# Patient Record
Sex: Female | Born: 1935 | Race: White | Hispanic: No | Smoking: Former smoker
Health system: Southern US, Community
[De-identification: ages and names within clinical notes are randomized; demographics above are authoritative.]

## PROBLEM LIST (undated history)

## (undated) DIAGNOSIS — J449 Chronic obstructive pulmonary disease, unspecified: Secondary | ICD-10-CM

## (undated) DIAGNOSIS — K219 Gastro-esophageal reflux disease without esophagitis: Secondary | ICD-10-CM

## (undated) DIAGNOSIS — E039 Hypothyroidism, unspecified: Secondary | ICD-10-CM

## (undated) DIAGNOSIS — N289 Disorder of kidney and ureter, unspecified: Secondary | ICD-10-CM

## (undated) DIAGNOSIS — Z87440 Personal history of urinary (tract) infections: Secondary | ICD-10-CM

## (undated) DIAGNOSIS — M1990 Unspecified osteoarthritis, unspecified site: Secondary | ICD-10-CM

## (undated) DIAGNOSIS — I639 Cerebral infarction, unspecified: Secondary | ICD-10-CM

## (undated) DIAGNOSIS — E119 Type 2 diabetes mellitus without complications: Secondary | ICD-10-CM

## (undated) DIAGNOSIS — E785 Hyperlipidemia, unspecified: Secondary | ICD-10-CM

## (undated) DIAGNOSIS — G255 Other chorea: Secondary | ICD-10-CM

## (undated) DIAGNOSIS — I10 Essential (primary) hypertension: Secondary | ICD-10-CM

## (undated) DIAGNOSIS — R42 Dizziness and giddiness: Secondary | ICD-10-CM

## (undated) DIAGNOSIS — C3490 Malignant neoplasm of unspecified part of unspecified bronchus or lung: Secondary | ICD-10-CM

## (undated) DIAGNOSIS — Z9109 Other allergy status, other than to drugs and biological substances: Secondary | ICD-10-CM

## (undated) DIAGNOSIS — Z8679 Personal history of other diseases of the circulatory system: Secondary | ICD-10-CM

## (undated) DIAGNOSIS — H6690 Otitis media, unspecified, unspecified ear: Secondary | ICD-10-CM

## (undated) DIAGNOSIS — G459 Transient cerebral ischemic attack, unspecified: Secondary | ICD-10-CM

## (undated) HISTORY — DX: Other chorea: G25.5

## (undated) HISTORY — PX: CARDIAC CATHETERIZATION: SHX172

## (undated) HISTORY — DX: Transient cerebral ischemic attack, unspecified: G45.9

## (undated) HISTORY — DX: Unspecified osteoarthritis, unspecified site: M19.90

## (undated) HISTORY — DX: Personal history of other diseases of the circulatory system: Z86.79

## (undated) HISTORY — PX: CATARACT EXTRACTION: SUR2

## (undated) HISTORY — DX: Type 2 diabetes mellitus without complications: E11.9

## (undated) HISTORY — DX: Malignant neoplasm of unspecified part of unspecified bronchus or lung: C34.90

## (undated) HISTORY — DX: Cerebral infarction, unspecified: I63.9

## (undated) HISTORY — DX: Other allergy status, other than to drugs and biological substances: Z91.09

## (undated) HISTORY — PX: LUNG LOBECTOMY: SHX167

## (undated) HISTORY — DX: Disorder of kidney and ureter, unspecified: N28.9

## (undated) HISTORY — PX: THYROID SURGERY: SHX805

## (undated) HISTORY — DX: Hypothyroidism, unspecified: E03.9

## (undated) HISTORY — DX: Otitis media, unspecified, unspecified ear: H66.90

## (undated) HISTORY — DX: Gastro-esophageal reflux disease without esophagitis: K21.9

## (undated) HISTORY — DX: Dizziness and giddiness: R42

## (undated) HISTORY — DX: Personal history of urinary (tract) infections: Z87.440

## (undated) HISTORY — DX: Chronic obstructive pulmonary disease, unspecified: J44.9

## (undated) HISTORY — DX: Essential (primary) hypertension: I10

## (undated) HISTORY — DX: Hyperlipidemia, unspecified: E78.5

---

## 2004-10-15 ENCOUNTER — Ambulatory Visit: Payer: Self-pay | Admitting: Urology

## 2004-10-24 ENCOUNTER — Ambulatory Visit: Payer: Self-pay | Admitting: Urology

## 2004-11-08 ENCOUNTER — Ambulatory Visit: Payer: Self-pay | Admitting: Urology

## 2007-01-28 ENCOUNTER — Ambulatory Visit: Payer: Self-pay | Admitting: Internal Medicine

## 2007-02-04 ENCOUNTER — Ambulatory Visit: Payer: Self-pay | Admitting: Internal Medicine

## 2007-06-25 ENCOUNTER — Ambulatory Visit: Payer: Self-pay | Admitting: Urology

## 2007-08-14 ENCOUNTER — Ambulatory Visit: Payer: Self-pay | Admitting: Internal Medicine

## 2007-09-16 ENCOUNTER — Other Ambulatory Visit: Payer: Self-pay

## 2007-09-16 ENCOUNTER — Ambulatory Visit: Payer: Self-pay | Admitting: Vascular Surgery

## 2007-09-23 ENCOUNTER — Inpatient Hospital Stay: Payer: Self-pay | Admitting: Vascular Surgery

## 2008-02-09 ENCOUNTER — Ambulatory Visit: Payer: Self-pay | Admitting: Family Medicine

## 2008-02-17 ENCOUNTER — Ambulatory Visit: Payer: Self-pay | Admitting: Internal Medicine

## 2008-03-10 ENCOUNTER — Ambulatory Visit: Payer: Self-pay | Admitting: Internal Medicine

## 2008-09-06 ENCOUNTER — Ambulatory Visit: Payer: Self-pay | Admitting: Internal Medicine

## 2008-09-14 ENCOUNTER — Ambulatory Visit: Payer: Self-pay | Admitting: Internal Medicine

## 2008-09-19 ENCOUNTER — Ambulatory Visit: Payer: Self-pay | Admitting: Internal Medicine

## 2008-11-14 ENCOUNTER — Ambulatory Visit: Payer: Self-pay | Admitting: Internal Medicine

## 2009-02-17 ENCOUNTER — Ambulatory Visit: Payer: Self-pay | Admitting: Internal Medicine

## 2009-04-04 ENCOUNTER — Ambulatory Visit: Payer: Self-pay | Admitting: Internal Medicine

## 2009-05-23 ENCOUNTER — Ambulatory Visit: Payer: Self-pay | Admitting: Internal Medicine

## 2009-06-19 ENCOUNTER — Encounter: Payer: Self-pay | Admitting: Cardiothoracic Surgery

## 2009-07-12 ENCOUNTER — Encounter: Payer: Self-pay | Admitting: Cardiothoracic Surgery

## 2009-08-11 ENCOUNTER — Encounter: Payer: Self-pay | Admitting: Cardiothoracic Surgery

## 2009-09-11 ENCOUNTER — Encounter: Payer: Self-pay | Admitting: Cardiothoracic Surgery

## 2009-10-11 ENCOUNTER — Encounter: Payer: Self-pay | Admitting: Cardiothoracic Surgery

## 2009-10-18 ENCOUNTER — Ambulatory Visit: Payer: Self-pay | Admitting: Internal Medicine

## 2009-11-11 ENCOUNTER — Ambulatory Visit: Payer: Self-pay | Admitting: Internal Medicine

## 2009-12-12 ENCOUNTER — Ambulatory Visit: Payer: Self-pay | Admitting: Internal Medicine

## 2010-02-08 ENCOUNTER — Ambulatory Visit: Payer: Self-pay | Admitting: Internal Medicine

## 2010-02-09 ENCOUNTER — Ambulatory Visit: Payer: Self-pay | Admitting: Internal Medicine

## 2010-03-07 ENCOUNTER — Ambulatory Visit: Payer: Self-pay | Admitting: Urology

## 2010-04-19 ENCOUNTER — Ambulatory Visit: Payer: Self-pay | Admitting: Internal Medicine

## 2010-11-06 ENCOUNTER — Ambulatory Visit: Payer: Self-pay | Admitting: Internal Medicine

## 2011-12-18 ENCOUNTER — Ambulatory Visit: Payer: Self-pay | Admitting: Internal Medicine

## 2011-12-18 DIAGNOSIS — R928 Other abnormal and inconclusive findings on diagnostic imaging of breast: Secondary | ICD-10-CM | POA: Diagnosis not present

## 2011-12-18 DIAGNOSIS — Z1231 Encounter for screening mammogram for malignant neoplasm of breast: Secondary | ICD-10-CM | POA: Diagnosis not present

## 2012-01-01 DIAGNOSIS — N3020 Other chronic cystitis without hematuria: Secondary | ICD-10-CM | POA: Diagnosis not present

## 2012-01-01 DIAGNOSIS — J305 Allergic rhinitis due to food: Secondary | ICD-10-CM | POA: Diagnosis not present

## 2012-01-01 DIAGNOSIS — R339 Retention of urine, unspecified: Secondary | ICD-10-CM | POA: Diagnosis not present

## 2012-01-01 DIAGNOSIS — R3129 Other microscopic hematuria: Secondary | ICD-10-CM | POA: Diagnosis not present

## 2012-01-01 DIAGNOSIS — R221 Localized swelling, mass and lump, neck: Secondary | ICD-10-CM | POA: Diagnosis not present

## 2012-01-01 DIAGNOSIS — N3946 Mixed incontinence: Secondary | ICD-10-CM | POA: Diagnosis not present

## 2012-01-01 DIAGNOSIS — R220 Localized swelling, mass and lump, head: Secondary | ICD-10-CM | POA: Diagnosis not present

## 2012-01-15 ENCOUNTER — Other Ambulatory Visit: Payer: Self-pay | Admitting: Otolaryngology

## 2012-01-15 DIAGNOSIS — Z1389 Encounter for screening for other disorder: Secondary | ICD-10-CM | POA: Diagnosis not present

## 2012-01-15 DIAGNOSIS — K1120 Sialoadenitis, unspecified: Secondary | ICD-10-CM | POA: Diagnosis not present

## 2012-01-15 DIAGNOSIS — R220 Localized swelling, mass and lump, head: Secondary | ICD-10-CM | POA: Diagnosis not present

## 2012-01-15 DIAGNOSIS — R221 Localized swelling, mass and lump, neck: Secondary | ICD-10-CM | POA: Diagnosis not present

## 2012-01-15 LAB — CREATININE, SERUM
Creatinine: 1.27 mg/dL (ref 0.60–1.30)
EGFR (African American): 52 — ABNORMAL LOW

## 2012-01-22 ENCOUNTER — Ambulatory Visit: Payer: Self-pay | Admitting: Otolaryngology

## 2012-01-22 DIAGNOSIS — R220 Localized swelling, mass and lump, head: Secondary | ICD-10-CM | POA: Diagnosis not present

## 2012-01-30 DIAGNOSIS — K219 Gastro-esophageal reflux disease without esophagitis: Secondary | ICD-10-CM | POA: Diagnosis not present

## 2012-01-30 DIAGNOSIS — I6529 Occlusion and stenosis of unspecified carotid artery: Secondary | ICD-10-CM | POA: Diagnosis not present

## 2012-01-30 DIAGNOSIS — F411 Generalized anxiety disorder: Secondary | ICD-10-CM | POA: Diagnosis not present

## 2012-01-30 DIAGNOSIS — I10 Essential (primary) hypertension: Secondary | ICD-10-CM | POA: Diagnosis not present

## 2012-01-30 DIAGNOSIS — E782 Mixed hyperlipidemia: Secondary | ICD-10-CM | POA: Diagnosis not present

## 2012-01-30 DIAGNOSIS — C3490 Malignant neoplasm of unspecified part of unspecified bronchus or lung: Secondary | ICD-10-CM | POA: Diagnosis not present

## 2012-01-30 DIAGNOSIS — E109 Type 1 diabetes mellitus without complications: Secondary | ICD-10-CM | POA: Diagnosis not present

## 2012-02-11 DIAGNOSIS — R918 Other nonspecific abnormal finding of lung field: Secondary | ICD-10-CM | POA: Diagnosis not present

## 2012-02-11 DIAGNOSIS — C3410 Malignant neoplasm of upper lobe, unspecified bronchus or lung: Secondary | ICD-10-CM | POA: Diagnosis not present

## 2012-02-11 DIAGNOSIS — E041 Nontoxic single thyroid nodule: Secondary | ICD-10-CM | POA: Diagnosis not present

## 2012-02-11 DIAGNOSIS — C3490 Malignant neoplasm of unspecified part of unspecified bronchus or lung: Secondary | ICD-10-CM | POA: Diagnosis not present

## 2012-02-18 DIAGNOSIS — K1120 Sialoadenitis, unspecified: Secondary | ICD-10-CM | POA: Diagnosis not present

## 2012-02-18 DIAGNOSIS — R42 Dizziness and giddiness: Secondary | ICD-10-CM | POA: Diagnosis not present

## 2012-02-18 DIAGNOSIS — R220 Localized swelling, mass and lump, head: Secondary | ICD-10-CM | POA: Diagnosis not present

## 2012-02-18 DIAGNOSIS — R221 Localized swelling, mass and lump, neck: Secondary | ICD-10-CM | POA: Diagnosis not present

## 2012-02-20 DIAGNOSIS — R42 Dizziness and giddiness: Secondary | ICD-10-CM | POA: Diagnosis not present

## 2012-02-20 DIAGNOSIS — J301 Allergic rhinitis due to pollen: Secondary | ICD-10-CM | POA: Diagnosis not present

## 2012-02-20 DIAGNOSIS — H8110 Benign paroxysmal vertigo, unspecified ear: Secondary | ICD-10-CM | POA: Diagnosis not present

## 2012-02-26 DIAGNOSIS — H8110 Benign paroxysmal vertigo, unspecified ear: Secondary | ICD-10-CM | POA: Diagnosis not present

## 2012-04-15 DIAGNOSIS — Z85828 Personal history of other malignant neoplasm of skin: Secondary | ICD-10-CM | POA: Diagnosis not present

## 2012-04-15 DIAGNOSIS — L570 Actinic keratosis: Secondary | ICD-10-CM | POA: Diagnosis not present

## 2012-05-07 DIAGNOSIS — C3490 Malignant neoplasm of unspecified part of unspecified bronchus or lung: Secondary | ICD-10-CM | POA: Diagnosis not present

## 2012-05-07 DIAGNOSIS — R0602 Shortness of breath: Secondary | ICD-10-CM | POA: Diagnosis not present

## 2012-05-21 DIAGNOSIS — H8110 Benign paroxysmal vertigo, unspecified ear: Secondary | ICD-10-CM | POA: Diagnosis not present

## 2012-05-21 DIAGNOSIS — L0201 Cutaneous abscess of face: Secondary | ICD-10-CM | POA: Diagnosis not present

## 2012-05-21 DIAGNOSIS — L03211 Cellulitis of face: Secondary | ICD-10-CM | POA: Diagnosis not present

## 2012-05-27 DIAGNOSIS — R0602 Shortness of breath: Secondary | ICD-10-CM | POA: Diagnosis not present

## 2012-05-28 DIAGNOSIS — I6529 Occlusion and stenosis of unspecified carotid artery: Secondary | ICD-10-CM | POA: Diagnosis not present

## 2012-05-28 DIAGNOSIS — E782 Mixed hyperlipidemia: Secondary | ICD-10-CM | POA: Diagnosis not present

## 2012-05-28 DIAGNOSIS — K219 Gastro-esophageal reflux disease without esophagitis: Secondary | ICD-10-CM | POA: Diagnosis not present

## 2012-05-28 DIAGNOSIS — J398 Other specified diseases of upper respiratory tract: Secondary | ICD-10-CM | POA: Diagnosis not present

## 2012-05-28 DIAGNOSIS — C3490 Malignant neoplasm of unspecified part of unspecified bronchus or lung: Secondary | ICD-10-CM | POA: Diagnosis not present

## 2012-05-28 DIAGNOSIS — E109 Type 1 diabetes mellitus without complications: Secondary | ICD-10-CM | POA: Diagnosis not present

## 2012-05-28 DIAGNOSIS — F411 Generalized anxiety disorder: Secondary | ICD-10-CM | POA: Diagnosis not present

## 2012-05-28 DIAGNOSIS — I10 Essential (primary) hypertension: Secondary | ICD-10-CM | POA: Diagnosis not present

## 2012-05-29 DIAGNOSIS — E119 Type 2 diabetes mellitus without complications: Secondary | ICD-10-CM | POA: Diagnosis not present

## 2012-05-29 DIAGNOSIS — I10 Essential (primary) hypertension: Secondary | ICD-10-CM | POA: Diagnosis not present

## 2012-06-03 DIAGNOSIS — J438 Other emphysema: Secondary | ICD-10-CM | POA: Diagnosis not present

## 2012-06-16 DIAGNOSIS — R0602 Shortness of breath: Secondary | ICD-10-CM | POA: Diagnosis not present

## 2012-06-16 DIAGNOSIS — C3490 Malignant neoplasm of unspecified part of unspecified bronchus or lung: Secondary | ICD-10-CM | POA: Diagnosis not present

## 2012-07-01 DIAGNOSIS — N182 Chronic kidney disease, stage 2 (mild): Secondary | ICD-10-CM | POA: Diagnosis not present

## 2012-07-01 DIAGNOSIS — N3946 Mixed incontinence: Secondary | ICD-10-CM | POA: Diagnosis not present

## 2012-07-01 DIAGNOSIS — R339 Retention of urine, unspecified: Secondary | ICD-10-CM | POA: Diagnosis not present

## 2012-07-01 DIAGNOSIS — N3020 Other chronic cystitis without hematuria: Secondary | ICD-10-CM | POA: Diagnosis not present

## 2012-07-09 DIAGNOSIS — R221 Localized swelling, mass and lump, neck: Secondary | ICD-10-CM | POA: Diagnosis not present

## 2012-07-09 DIAGNOSIS — C3490 Malignant neoplasm of unspecified part of unspecified bronchus or lung: Secondary | ICD-10-CM | POA: Diagnosis not present

## 2012-07-09 DIAGNOSIS — E782 Mixed hyperlipidemia: Secondary | ICD-10-CM | POA: Diagnosis not present

## 2012-07-09 DIAGNOSIS — I10 Essential (primary) hypertension: Secondary | ICD-10-CM | POA: Diagnosis not present

## 2012-07-09 DIAGNOSIS — IMO0001 Reserved for inherently not codable concepts without codable children: Secondary | ICD-10-CM | POA: Diagnosis not present

## 2012-07-09 DIAGNOSIS — F329 Major depressive disorder, single episode, unspecified: Secondary | ICD-10-CM | POA: Diagnosis not present

## 2012-07-09 DIAGNOSIS — R109 Unspecified abdominal pain: Secondary | ICD-10-CM | POA: Diagnosis not present

## 2012-07-09 DIAGNOSIS — R220 Localized swelling, mass and lump, head: Secondary | ICD-10-CM | POA: Diagnosis not present

## 2012-07-09 DIAGNOSIS — T783XXA Angioneurotic edema, initial encounter: Secondary | ICD-10-CM | POA: Diagnosis not present

## 2012-07-21 DIAGNOSIS — E039 Hypothyroidism, unspecified: Secondary | ICD-10-CM | POA: Diagnosis not present

## 2012-07-28 DIAGNOSIS — R221 Localized swelling, mass and lump, neck: Secondary | ICD-10-CM | POA: Diagnosis not present

## 2012-07-28 DIAGNOSIS — J309 Allergic rhinitis, unspecified: Secondary | ICD-10-CM | POA: Diagnosis not present

## 2012-07-28 DIAGNOSIS — T783XXA Angioneurotic edema, initial encounter: Secondary | ICD-10-CM | POA: Diagnosis not present

## 2012-07-28 DIAGNOSIS — IMO0001 Reserved for inherently not codable concepts without codable children: Secondary | ICD-10-CM | POA: Diagnosis not present

## 2012-07-28 DIAGNOSIS — R220 Localized swelling, mass and lump, head: Secondary | ICD-10-CM | POA: Diagnosis not present

## 2012-07-28 DIAGNOSIS — C3490 Malignant neoplasm of unspecified part of unspecified bronchus or lung: Secondary | ICD-10-CM | POA: Diagnosis not present

## 2012-07-28 DIAGNOSIS — F329 Major depressive disorder, single episode, unspecified: Secondary | ICD-10-CM | POA: Diagnosis not present

## 2012-07-28 DIAGNOSIS — I10 Essential (primary) hypertension: Secondary | ICD-10-CM | POA: Diagnosis not present

## 2012-08-17 DIAGNOSIS — E890 Postprocedural hypothyroidism: Secondary | ICD-10-CM | POA: Diagnosis not present

## 2012-08-17 DIAGNOSIS — E041 Nontoxic single thyroid nodule: Secondary | ICD-10-CM | POA: Diagnosis not present

## 2012-09-14 DIAGNOSIS — E039 Hypothyroidism, unspecified: Secondary | ICD-10-CM | POA: Diagnosis not present

## 2012-09-14 DIAGNOSIS — Z79899 Other long term (current) drug therapy: Secondary | ICD-10-CM | POA: Diagnosis not present

## 2012-09-14 DIAGNOSIS — E782 Mixed hyperlipidemia: Secondary | ICD-10-CM | POA: Diagnosis not present

## 2012-09-14 DIAGNOSIS — E119 Type 2 diabetes mellitus without complications: Secondary | ICD-10-CM | POA: Diagnosis not present

## 2012-09-17 DIAGNOSIS — IMO0001 Reserved for inherently not codable concepts without codable children: Secondary | ICD-10-CM | POA: Diagnosis not present

## 2012-09-17 DIAGNOSIS — I10 Essential (primary) hypertension: Secondary | ICD-10-CM | POA: Diagnosis not present

## 2012-09-17 DIAGNOSIS — Z23 Encounter for immunization: Secondary | ICD-10-CM | POA: Diagnosis not present

## 2012-09-17 DIAGNOSIS — E782 Mixed hyperlipidemia: Secondary | ICD-10-CM | POA: Diagnosis not present

## 2012-09-28 DIAGNOSIS — M109 Gout, unspecified: Secondary | ICD-10-CM | POA: Diagnosis not present

## 2012-09-28 DIAGNOSIS — N183 Chronic kidney disease, stage 3 unspecified: Secondary | ICD-10-CM | POA: Diagnosis not present

## 2012-09-28 DIAGNOSIS — N390 Urinary tract infection, site not specified: Secondary | ICD-10-CM | POA: Diagnosis not present

## 2012-09-28 DIAGNOSIS — I10 Essential (primary) hypertension: Secondary | ICD-10-CM | POA: Diagnosis not present

## 2012-09-28 DIAGNOSIS — N2581 Secondary hyperparathyroidism of renal origin: Secondary | ICD-10-CM | POA: Diagnosis not present

## 2012-10-15 DIAGNOSIS — R109 Unspecified abdominal pain: Secondary | ICD-10-CM | POA: Diagnosis not present

## 2012-10-15 DIAGNOSIS — D485 Neoplasm of uncertain behavior of skin: Secondary | ICD-10-CM | POA: Diagnosis not present

## 2012-10-15 DIAGNOSIS — L259 Unspecified contact dermatitis, unspecified cause: Secondary | ICD-10-CM | POA: Diagnosis not present

## 2012-10-15 DIAGNOSIS — Z85828 Personal history of other malignant neoplasm of skin: Secondary | ICD-10-CM | POA: Diagnosis not present

## 2012-10-26 DIAGNOSIS — M109 Gout, unspecified: Secondary | ICD-10-CM | POA: Diagnosis not present

## 2012-10-26 DIAGNOSIS — N183 Chronic kidney disease, stage 3 unspecified: Secondary | ICD-10-CM | POA: Diagnosis not present

## 2012-10-26 DIAGNOSIS — N2581 Secondary hyperparathyroidism of renal origin: Secondary | ICD-10-CM | POA: Diagnosis not present

## 2012-10-26 DIAGNOSIS — R609 Edema, unspecified: Secondary | ICD-10-CM | POA: Diagnosis not present

## 2012-11-10 DIAGNOSIS — N183 Chronic kidney disease, stage 3 unspecified: Secondary | ICD-10-CM | POA: Diagnosis not present

## 2012-11-10 DIAGNOSIS — M109 Gout, unspecified: Secondary | ICD-10-CM | POA: Diagnosis not present

## 2012-12-22 DIAGNOSIS — R0602 Shortness of breath: Secondary | ICD-10-CM | POA: Diagnosis not present

## 2012-12-22 DIAGNOSIS — C3490 Malignant neoplasm of unspecified part of unspecified bronchus or lung: Secondary | ICD-10-CM | POA: Diagnosis not present

## 2013-01-18 DIAGNOSIS — E119 Type 2 diabetes mellitus without complications: Secondary | ICD-10-CM | POA: Diagnosis not present

## 2013-01-18 DIAGNOSIS — Z006 Encounter for examination for normal comparison and control in clinical research program: Secondary | ICD-10-CM | POA: Diagnosis not present

## 2013-01-18 DIAGNOSIS — E0590 Thyrotoxicosis, unspecified without thyrotoxic crisis or storm: Secondary | ICD-10-CM | POA: Diagnosis not present

## 2013-01-18 DIAGNOSIS — K219 Gastro-esophageal reflux disease without esophagitis: Secondary | ICD-10-CM | POA: Diagnosis not present

## 2013-01-18 DIAGNOSIS — IMO0001 Reserved for inherently not codable concepts without codable children: Secondary | ICD-10-CM | POA: Diagnosis not present

## 2013-01-18 DIAGNOSIS — I6529 Occlusion and stenosis of unspecified carotid artery: Secondary | ICD-10-CM | POA: Diagnosis not present

## 2013-01-18 DIAGNOSIS — I10 Essential (primary) hypertension: Secondary | ICD-10-CM | POA: Diagnosis not present

## 2013-01-18 DIAGNOSIS — M159 Polyosteoarthritis, unspecified: Secondary | ICD-10-CM | POA: Diagnosis not present

## 2013-01-18 DIAGNOSIS — N182 Chronic kidney disease, stage 2 (mild): Secondary | ICD-10-CM | POA: Diagnosis not present

## 2013-01-19 ENCOUNTER — Ambulatory Visit: Payer: Self-pay | Admitting: Internal Medicine

## 2013-01-19 DIAGNOSIS — R928 Other abnormal and inconclusive findings on diagnostic imaging of breast: Secondary | ICD-10-CM | POA: Diagnosis not present

## 2013-01-19 DIAGNOSIS — Z1231 Encounter for screening mammogram for malignant neoplasm of breast: Secondary | ICD-10-CM | POA: Diagnosis not present

## 2013-01-25 DIAGNOSIS — E2839 Other primary ovarian failure: Secondary | ICD-10-CM | POA: Diagnosis not present

## 2013-04-14 DIAGNOSIS — E782 Mixed hyperlipidemia: Secondary | ICD-10-CM | POA: Diagnosis not present

## 2013-04-14 DIAGNOSIS — E039 Hypothyroidism, unspecified: Secondary | ICD-10-CM | POA: Diagnosis not present

## 2013-04-14 DIAGNOSIS — Z79899 Other long term (current) drug therapy: Secondary | ICD-10-CM | POA: Diagnosis not present

## 2013-04-14 DIAGNOSIS — E119 Type 2 diabetes mellitus without complications: Secondary | ICD-10-CM | POA: Diagnosis not present

## 2013-04-22 DIAGNOSIS — N182 Chronic kidney disease, stage 2 (mild): Secondary | ICD-10-CM | POA: Diagnosis not present

## 2013-04-22 DIAGNOSIS — I739 Peripheral vascular disease, unspecified: Secondary | ICD-10-CM | POA: Diagnosis not present

## 2013-04-22 DIAGNOSIS — M109 Gout, unspecified: Secondary | ICD-10-CM | POA: Diagnosis not present

## 2013-04-22 DIAGNOSIS — E039 Hypothyroidism, unspecified: Secondary | ICD-10-CM | POA: Diagnosis not present

## 2013-04-22 DIAGNOSIS — C3410 Malignant neoplasm of upper lobe, unspecified bronchus or lung: Secondary | ICD-10-CM | POA: Diagnosis not present

## 2013-04-22 DIAGNOSIS — E782 Mixed hyperlipidemia: Secondary | ICD-10-CM | POA: Diagnosis not present

## 2013-04-22 DIAGNOSIS — IMO0001 Reserved for inherently not codable concepts without codable children: Secondary | ICD-10-CM | POA: Diagnosis not present

## 2013-04-29 DIAGNOSIS — N182 Chronic kidney disease, stage 2 (mild): Secondary | ICD-10-CM | POA: Diagnosis not present

## 2013-06-15 DIAGNOSIS — L659 Nonscarring hair loss, unspecified: Secondary | ICD-10-CM | POA: Diagnosis not present

## 2013-06-15 DIAGNOSIS — D485 Neoplasm of uncertain behavior of skin: Secondary | ICD-10-CM | POA: Diagnosis not present

## 2013-06-15 DIAGNOSIS — Z85828 Personal history of other malignant neoplasm of skin: Secondary | ICD-10-CM | POA: Diagnosis not present

## 2013-06-30 DIAGNOSIS — C3490 Malignant neoplasm of unspecified part of unspecified bronchus or lung: Secondary | ICD-10-CM | POA: Diagnosis not present

## 2013-06-30 DIAGNOSIS — R220 Localized swelling, mass and lump, head: Secondary | ICD-10-CM | POA: Diagnosis not present

## 2013-06-30 DIAGNOSIS — J438 Other emphysema: Secondary | ICD-10-CM | POA: Diagnosis not present

## 2013-07-22 DIAGNOSIS — I10 Essential (primary) hypertension: Secondary | ICD-10-CM | POA: Diagnosis not present

## 2013-07-22 DIAGNOSIS — IMO0001 Reserved for inherently not codable concepts without codable children: Secondary | ICD-10-CM | POA: Diagnosis not present

## 2013-07-22 DIAGNOSIS — J019 Acute sinusitis, unspecified: Secondary | ICD-10-CM | POA: Diagnosis not present

## 2013-07-22 DIAGNOSIS — I6529 Occlusion and stenosis of unspecified carotid artery: Secondary | ICD-10-CM | POA: Diagnosis not present

## 2013-07-22 DIAGNOSIS — K219 Gastro-esophageal reflux disease without esophagitis: Secondary | ICD-10-CM | POA: Diagnosis not present

## 2013-07-26 DIAGNOSIS — I6529 Occlusion and stenosis of unspecified carotid artery: Secondary | ICD-10-CM | POA: Diagnosis not present

## 2013-08-05 DIAGNOSIS — N3946 Mixed incontinence: Secondary | ICD-10-CM | POA: Insufficient documentation

## 2013-08-05 DIAGNOSIS — N200 Calculus of kidney: Secondary | ICD-10-CM | POA: Insufficient documentation

## 2013-08-05 DIAGNOSIS — E049 Nontoxic goiter, unspecified: Secondary | ICD-10-CM | POA: Diagnosis not present

## 2013-08-05 DIAGNOSIS — N182 Chronic kidney disease, stage 2 (mild): Secondary | ICD-10-CM | POA: Insufficient documentation

## 2013-08-05 DIAGNOSIS — R3129 Other microscopic hematuria: Secondary | ICD-10-CM | POA: Insufficient documentation

## 2013-08-05 DIAGNOSIS — R339 Retention of urine, unspecified: Secondary | ICD-10-CM | POA: Insufficient documentation

## 2013-08-05 DIAGNOSIS — N3020 Other chronic cystitis without hematuria: Secondary | ICD-10-CM | POA: Insufficient documentation

## 2013-08-05 DIAGNOSIS — D4100 Neoplasm of uncertain behavior of unspecified kidney: Secondary | ICD-10-CM | POA: Insufficient documentation

## 2013-09-03 DIAGNOSIS — Z23 Encounter for immunization: Secondary | ICD-10-CM | POA: Diagnosis not present

## 2013-10-18 DIAGNOSIS — I10 Essential (primary) hypertension: Secondary | ICD-10-CM | POA: Diagnosis not present

## 2013-10-18 DIAGNOSIS — E7800 Pure hypercholesterolemia, unspecified: Secondary | ICD-10-CM | POA: Diagnosis not present

## 2013-10-18 DIAGNOSIS — IMO0001 Reserved for inherently not codable concepts without codable children: Secondary | ICD-10-CM | POA: Diagnosis not present

## 2013-10-18 DIAGNOSIS — E039 Hypothyroidism, unspecified: Secondary | ICD-10-CM | POA: Diagnosis not present

## 2013-10-26 DIAGNOSIS — N182 Chronic kidney disease, stage 2 (mild): Secondary | ICD-10-CM | POA: Diagnosis not present

## 2013-10-26 DIAGNOSIS — E041 Nontoxic single thyroid nodule: Secondary | ICD-10-CM | POA: Diagnosis not present

## 2013-10-26 DIAGNOSIS — E1169 Type 2 diabetes mellitus with other specified complication: Secondary | ICD-10-CM | POA: Diagnosis not present

## 2013-10-26 DIAGNOSIS — E782 Mixed hyperlipidemia: Secondary | ICD-10-CM | POA: Diagnosis not present

## 2013-10-26 DIAGNOSIS — M109 Gout, unspecified: Secondary | ICD-10-CM | POA: Diagnosis not present

## 2013-10-26 DIAGNOSIS — M159 Polyosteoarthritis, unspecified: Secondary | ICD-10-CM | POA: Diagnosis not present

## 2013-10-26 DIAGNOSIS — I10 Essential (primary) hypertension: Secondary | ICD-10-CM | POA: Diagnosis not present

## 2014-01-05 DIAGNOSIS — N3020 Other chronic cystitis without hematuria: Secondary | ICD-10-CM | POA: Diagnosis not present

## 2014-01-26 DIAGNOSIS — N182 Chronic kidney disease, stage 2 (mild): Secondary | ICD-10-CM | POA: Diagnosis not present

## 2014-01-26 DIAGNOSIS — R3129 Other microscopic hematuria: Secondary | ICD-10-CM | POA: Diagnosis not present

## 2014-01-26 DIAGNOSIS — R339 Retention of urine, unspecified: Secondary | ICD-10-CM | POA: Diagnosis not present

## 2014-01-26 DIAGNOSIS — N3020 Other chronic cystitis without hematuria: Secondary | ICD-10-CM | POA: Diagnosis not present

## 2014-02-03 DIAGNOSIS — N200 Calculus of kidney: Secondary | ICD-10-CM | POA: Diagnosis not present

## 2014-02-03 DIAGNOSIS — R3129 Other microscopic hematuria: Secondary | ICD-10-CM | POA: Diagnosis not present

## 2014-02-03 DIAGNOSIS — R911 Solitary pulmonary nodule: Secondary | ICD-10-CM | POA: Diagnosis not present

## 2014-02-14 DIAGNOSIS — C3490 Malignant neoplasm of unspecified part of unspecified bronchus or lung: Secondary | ICD-10-CM | POA: Insufficient documentation

## 2014-02-15 DIAGNOSIS — R918 Other nonspecific abnormal finding of lung field: Secondary | ICD-10-CM | POA: Diagnosis not present

## 2014-02-15 DIAGNOSIS — C3490 Malignant neoplasm of unspecified part of unspecified bronchus or lung: Secondary | ICD-10-CM | POA: Diagnosis not present

## 2014-04-21 DIAGNOSIS — E049 Nontoxic goiter, unspecified: Secondary | ICD-10-CM | POA: Diagnosis not present

## 2014-04-26 ENCOUNTER — Ambulatory Visit: Payer: Self-pay | Admitting: Physician Assistant

## 2014-04-26 DIAGNOSIS — IMO0002 Reserved for concepts with insufficient information to code with codable children: Secondary | ICD-10-CM | POA: Diagnosis not present

## 2014-04-26 DIAGNOSIS — E782 Mixed hyperlipidemia: Secondary | ICD-10-CM | POA: Diagnosis not present

## 2014-04-26 DIAGNOSIS — N182 Chronic kidney disease, stage 2 (mild): Secondary | ICD-10-CM | POA: Diagnosis not present

## 2014-04-26 DIAGNOSIS — M159 Polyosteoarthritis, unspecified: Secondary | ICD-10-CM | POA: Diagnosis not present

## 2014-04-26 DIAGNOSIS — M1710 Unilateral primary osteoarthritis, unspecified knee: Secondary | ICD-10-CM | POA: Diagnosis not present

## 2014-04-26 DIAGNOSIS — M25559 Pain in unspecified hip: Secondary | ICD-10-CM | POA: Diagnosis not present

## 2014-04-26 DIAGNOSIS — I10 Essential (primary) hypertension: Secondary | ICD-10-CM | POA: Diagnosis not present

## 2014-04-26 DIAGNOSIS — E1169 Type 2 diabetes mellitus with other specified complication: Secondary | ICD-10-CM | POA: Diagnosis not present

## 2014-04-26 DIAGNOSIS — E039 Hypothyroidism, unspecified: Secondary | ICD-10-CM | POA: Diagnosis not present

## 2014-04-26 DIAGNOSIS — M47817 Spondylosis without myelopathy or radiculopathy, lumbosacral region: Secondary | ICD-10-CM | POA: Diagnosis not present

## 2014-04-26 DIAGNOSIS — M2550 Pain in unspecified joint: Secondary | ICD-10-CM | POA: Diagnosis not present

## 2014-05-09 DIAGNOSIS — M76899 Other specified enthesopathies of unspecified lower limb, excluding foot: Secondary | ICD-10-CM | POA: Diagnosis not present

## 2014-05-09 DIAGNOSIS — M67919 Unspecified disorder of synovium and tendon, unspecified shoulder: Secondary | ICD-10-CM | POA: Diagnosis not present

## 2014-05-09 DIAGNOSIS — M1610 Unilateral primary osteoarthritis, unspecified hip: Secondary | ICD-10-CM | POA: Diagnosis not present

## 2014-05-09 DIAGNOSIS — M719 Bursopathy, unspecified: Secondary | ICD-10-CM | POA: Diagnosis not present

## 2014-05-19 DIAGNOSIS — IMO0001 Reserved for inherently not codable concepts without codable children: Secondary | ICD-10-CM | POA: Diagnosis not present

## 2014-05-19 DIAGNOSIS — N189 Chronic kidney disease, unspecified: Secondary | ICD-10-CM | POA: Diagnosis not present

## 2014-05-19 DIAGNOSIS — E039 Hypothyroidism, unspecified: Secondary | ICD-10-CM | POA: Diagnosis not present

## 2014-05-19 DIAGNOSIS — E782 Mixed hyperlipidemia: Secondary | ICD-10-CM | POA: Diagnosis not present

## 2014-05-30 DIAGNOSIS — R7989 Other specified abnormal findings of blood chemistry: Secondary | ICD-10-CM | POA: Diagnosis not present

## 2014-05-30 DIAGNOSIS — N390 Urinary tract infection, site not specified: Secondary | ICD-10-CM | POA: Diagnosis not present

## 2014-05-30 DIAGNOSIS — M67919 Unspecified disorder of synovium and tendon, unspecified shoulder: Secondary | ICD-10-CM | POA: Diagnosis not present

## 2014-05-30 DIAGNOSIS — E1169 Type 2 diabetes mellitus with other specified complication: Secondary | ICD-10-CM | POA: Diagnosis not present

## 2014-05-30 DIAGNOSIS — E782 Mixed hyperlipidemia: Secondary | ICD-10-CM | POA: Diagnosis not present

## 2014-05-30 DIAGNOSIS — M2550 Pain in unspecified joint: Secondary | ICD-10-CM | POA: Diagnosis not present

## 2014-05-30 DIAGNOSIS — K119 Disease of salivary gland, unspecified: Secondary | ICD-10-CM | POA: Diagnosis not present

## 2014-05-30 DIAGNOSIS — M719 Bursopathy, unspecified: Secondary | ICD-10-CM | POA: Diagnosis not present

## 2014-05-30 DIAGNOSIS — E559 Vitamin D deficiency, unspecified: Secondary | ICD-10-CM | POA: Diagnosis not present

## 2014-06-14 DIAGNOSIS — L719 Rosacea, unspecified: Secondary | ICD-10-CM | POA: Diagnosis not present

## 2014-06-14 DIAGNOSIS — L821 Other seborrheic keratosis: Secondary | ICD-10-CM | POA: Diagnosis not present

## 2014-06-14 DIAGNOSIS — Z85828 Personal history of other malignant neoplasm of skin: Secondary | ICD-10-CM | POA: Diagnosis not present

## 2014-06-30 DIAGNOSIS — J438 Other emphysema: Secondary | ICD-10-CM | POA: Diagnosis not present

## 2014-06-30 DIAGNOSIS — J449 Chronic obstructive pulmonary disease, unspecified: Secondary | ICD-10-CM | POA: Diagnosis not present

## 2014-08-09 DIAGNOSIS — B029 Zoster without complications: Secondary | ICD-10-CM | POA: Diagnosis not present

## 2014-08-09 DIAGNOSIS — IMO0002 Reserved for concepts with insufficient information to code with codable children: Secondary | ICD-10-CM | POA: Diagnosis not present

## 2014-08-09 DIAGNOSIS — R21 Rash and other nonspecific skin eruption: Secondary | ICD-10-CM | POA: Diagnosis not present

## 2014-08-09 DIAGNOSIS — E119 Type 2 diabetes mellitus without complications: Secondary | ICD-10-CM | POA: Diagnosis not present

## 2014-09-28 DIAGNOSIS — E1122 Type 2 diabetes mellitus with diabetic chronic kidney disease: Secondary | ICD-10-CM | POA: Diagnosis not present

## 2014-09-28 DIAGNOSIS — E039 Hypothyroidism, unspecified: Secondary | ICD-10-CM | POA: Diagnosis not present

## 2014-09-28 DIAGNOSIS — I6522 Occlusion and stenosis of left carotid artery: Secondary | ICD-10-CM | POA: Diagnosis not present

## 2014-09-28 DIAGNOSIS — Z23 Encounter for immunization: Secondary | ICD-10-CM | POA: Diagnosis not present

## 2014-09-28 DIAGNOSIS — M10032 Idiopathic gout, left wrist: Secondary | ICD-10-CM | POA: Diagnosis not present

## 2014-09-28 DIAGNOSIS — E782 Mixed hyperlipidemia: Secondary | ICD-10-CM | POA: Diagnosis not present

## 2014-09-28 DIAGNOSIS — I10 Essential (primary) hypertension: Secondary | ICD-10-CM | POA: Diagnosis not present

## 2014-12-22 DIAGNOSIS — H6501 Acute serous otitis media, right ear: Secondary | ICD-10-CM | POA: Diagnosis not present

## 2014-12-22 DIAGNOSIS — R42 Dizziness and giddiness: Secondary | ICD-10-CM | POA: Diagnosis not present

## 2015-01-05 DIAGNOSIS — H821 Vertiginous syndromes in diseases classified elsewhere, right ear: Secondary | ICD-10-CM | POA: Diagnosis not present

## 2015-01-05 DIAGNOSIS — H6501 Acute serous otitis media, right ear: Secondary | ICD-10-CM | POA: Diagnosis not present

## 2015-01-19 DIAGNOSIS — R42 Dizziness and giddiness: Secondary | ICD-10-CM | POA: Diagnosis not present

## 2015-01-19 DIAGNOSIS — H6501 Acute serous otitis media, right ear: Secondary | ICD-10-CM | POA: Diagnosis not present

## 2015-01-23 DIAGNOSIS — M10032 Idiopathic gout, left wrist: Secondary | ICD-10-CM | POA: Diagnosis not present

## 2015-01-23 DIAGNOSIS — E782 Mixed hyperlipidemia: Secondary | ICD-10-CM | POA: Diagnosis not present

## 2015-01-23 DIAGNOSIS — E1169 Type 2 diabetes mellitus with other specified complication: Secondary | ICD-10-CM | POA: Diagnosis not present

## 2015-01-23 DIAGNOSIS — E039 Hypothyroidism, unspecified: Secondary | ICD-10-CM | POA: Diagnosis not present

## 2015-01-25 DIAGNOSIS — R51 Headache: Secondary | ICD-10-CM | POA: Diagnosis not present

## 2015-01-25 DIAGNOSIS — H903 Sensorineural hearing loss, bilateral: Secondary | ICD-10-CM | POA: Diagnosis not present

## 2015-01-25 DIAGNOSIS — H9201 Otalgia, right ear: Secondary | ICD-10-CM | POA: Diagnosis not present

## 2015-01-25 DIAGNOSIS — R42 Dizziness and giddiness: Secondary | ICD-10-CM | POA: Diagnosis not present

## 2015-02-03 ENCOUNTER — Ambulatory Visit: Payer: Self-pay | Admitting: Otolaryngology

## 2015-02-03 ENCOUNTER — Emergency Department: Payer: Self-pay | Admitting: Emergency Medicine

## 2015-02-03 DIAGNOSIS — R51 Headache: Secondary | ICD-10-CM | POA: Diagnosis not present

## 2015-02-03 DIAGNOSIS — I639 Cerebral infarction, unspecified: Secondary | ICD-10-CM | POA: Diagnosis not present

## 2015-02-03 DIAGNOSIS — R42 Dizziness and giddiness: Secondary | ICD-10-CM | POA: Diagnosis not present

## 2015-02-03 DIAGNOSIS — I10 Essential (primary) hypertension: Secondary | ICD-10-CM | POA: Diagnosis not present

## 2015-02-03 DIAGNOSIS — E119 Type 2 diabetes mellitus without complications: Secondary | ICD-10-CM | POA: Diagnosis not present

## 2015-02-03 LAB — URINALYSIS, COMPLETE
Bacteria: NONE SEEN
Bilirubin,UR: NEGATIVE
Glucose,UR: NEGATIVE mg/dL (ref 0–75)
KETONE: NEGATIVE
Leukocyte Esterase: NEGATIVE
Nitrite: NEGATIVE
PH: 6 (ref 4.5–8.0)
Protein: 30
Specific Gravity: 1.013 (ref 1.003–1.030)
Squamous Epithelial: 1
WBC UR: NONE SEEN /HPF (ref 0–5)

## 2015-02-03 LAB — COMPREHENSIVE METABOLIC PANEL
ALBUMIN: 4.1 g/dL
ALK PHOS: 62 U/L
ALT: 22 U/L
ANION GAP: 9 (ref 7–16)
BUN: 26 mg/dL — ABNORMAL HIGH
Bilirubin,Total: 0.5 mg/dL
CALCIUM: 9.7 mg/dL
CREATININE: 1.09 mg/dL — AB
Chloride: 103 mmol/L
Co2: 29 mmol/L
EGFR (African American): 54 — ABNORMAL LOW
EGFR (Non-African Amer.): 47 — ABNORMAL LOW
Glucose: 129 mg/dL — ABNORMAL HIGH
POTASSIUM: 3.4 mmol/L — AB
SGOT(AST): 27 U/L
SODIUM: 141 mmol/L
Total Protein: 7.6 g/dL

## 2015-02-03 LAB — CBC WITH DIFFERENTIAL/PLATELET
BASOS ABS: 0.1 10*3/uL (ref 0.0–0.1)
Basophil %: 0.7 %
Eosinophil #: 0.3 10*3/uL (ref 0.0–0.7)
Eosinophil %: 4 %
HCT: 39.2 % (ref 35.0–47.0)
HGB: 12.6 g/dL (ref 12.0–16.0)
LYMPHS ABS: 1.8 10*3/uL (ref 1.0–3.6)
Lymphocyte %: 21.9 %
MCH: 26.6 pg (ref 26.0–34.0)
MCHC: 32.1 g/dL (ref 32.0–36.0)
MCV: 83 fL (ref 80–100)
MONO ABS: 0.7 x10 3/mm (ref 0.2–0.9)
MONOS PCT: 8 %
Neutrophil #: 5.5 10*3/uL (ref 1.4–6.5)
Neutrophil %: 65.4 %
Platelet: 324 10*3/uL (ref 150–440)
RBC: 4.74 10*6/uL (ref 3.80–5.20)
RDW: 15.8 % — AB (ref 11.5–14.5)
WBC: 8.4 10*3/uL (ref 3.6–11.0)

## 2015-02-03 LAB — APTT: ACTIVATED PTT: 34.7 s (ref 23.6–35.9)

## 2015-02-03 LAB — PROTIME-INR
INR: 1.2
Prothrombin Time: 15.4 secs — ABNORMAL HIGH

## 2015-02-03 LAB — TROPONIN I

## 2015-02-14 DIAGNOSIS — R918 Other nonspecific abnormal finding of lung field: Secondary | ICD-10-CM | POA: Diagnosis not present

## 2015-02-14 DIAGNOSIS — Z902 Acquired absence of lung [part of]: Secondary | ICD-10-CM | POA: Diagnosis not present

## 2015-02-14 DIAGNOSIS — Z85118 Personal history of other malignant neoplasm of bronchus and lung: Secondary | ICD-10-CM | POA: Diagnosis not present

## 2015-02-14 DIAGNOSIS — C3490 Malignant neoplasm of unspecified part of unspecified bronchus or lung: Secondary | ICD-10-CM | POA: Diagnosis not present

## 2015-02-17 DIAGNOSIS — R42 Dizziness and giddiness: Secondary | ICD-10-CM | POA: Diagnosis not present

## 2015-02-17 DIAGNOSIS — E538 Deficiency of other specified B group vitamins: Secondary | ICD-10-CM | POA: Diagnosis not present

## 2015-02-17 DIAGNOSIS — H538 Other visual disturbances: Secondary | ICD-10-CM | POA: Diagnosis not present

## 2015-02-17 DIAGNOSIS — R5383 Other fatigue: Secondary | ICD-10-CM | POA: Diagnosis not present

## 2015-02-17 DIAGNOSIS — E559 Vitamin D deficiency, unspecified: Secondary | ICD-10-CM | POA: Diagnosis not present

## 2015-02-20 ENCOUNTER — Ambulatory Visit
Admit: 2015-02-20 | Disposition: A | Discharge status: Home / Self Care | Payer: Self-pay | Attending: Internal Medicine | Admitting: Internal Medicine

## 2015-02-20 DIAGNOSIS — Z0001 Encounter for general adult medical examination with abnormal findings: Secondary | ICD-10-CM | POA: Diagnosis not present

## 2015-02-20 DIAGNOSIS — I10 Essential (primary) hypertension: Secondary | ICD-10-CM | POA: Diagnosis not present

## 2015-02-20 DIAGNOSIS — I342 Nonrheumatic mitral (valve) stenosis: Secondary | ICD-10-CM | POA: Diagnosis not present

## 2015-02-20 DIAGNOSIS — M5032 Other cervical disc degeneration, mid-cervical region: Secondary | ICD-10-CM | POA: Diagnosis not present

## 2015-02-20 DIAGNOSIS — E1122 Type 2 diabetes mellitus with diabetic chronic kidney disease: Secondary | ICD-10-CM | POA: Diagnosis not present

## 2015-02-20 DIAGNOSIS — J45991 Cough variant asthma: Secondary | ICD-10-CM | POA: Diagnosis not present

## 2015-02-20 DIAGNOSIS — M5030 Other cervical disc degeneration, unspecified cervical region: Secondary | ICD-10-CM | POA: Diagnosis not present

## 2015-02-20 DIAGNOSIS — E039 Hypothyroidism, unspecified: Secondary | ICD-10-CM | POA: Diagnosis not present

## 2015-02-20 DIAGNOSIS — I6522 Occlusion and stenosis of left carotid artery: Secondary | ICD-10-CM | POA: Diagnosis not present

## 2015-02-21 DIAGNOSIS — N200 Calculus of kidney: Secondary | ICD-10-CM | POA: Diagnosis not present

## 2015-02-21 DIAGNOSIS — R339 Retention of urine, unspecified: Secondary | ICD-10-CM | POA: Diagnosis not present

## 2015-02-21 DIAGNOSIS — N3020 Other chronic cystitis without hematuria: Secondary | ICD-10-CM | POA: Diagnosis not present

## 2015-02-21 DIAGNOSIS — N182 Chronic kidney disease, stage 2 (mild): Secondary | ICD-10-CM | POA: Diagnosis not present

## 2015-02-23 DIAGNOSIS — I6522 Occlusion and stenosis of left carotid artery: Secondary | ICD-10-CM | POA: Diagnosis not present

## 2015-03-03 DIAGNOSIS — N3001 Acute cystitis with hematuria: Secondary | ICD-10-CM | POA: Diagnosis not present

## 2015-03-07 DIAGNOSIS — K219 Gastro-esophageal reflux disease without esophagitis: Secondary | ICD-10-CM | POA: Diagnosis not present

## 2015-03-07 DIAGNOSIS — I6522 Occlusion and stenosis of left carotid artery: Secondary | ICD-10-CM | POA: Diagnosis not present

## 2015-03-07 DIAGNOSIS — E039 Hypothyroidism, unspecified: Secondary | ICD-10-CM | POA: Diagnosis not present

## 2015-03-07 DIAGNOSIS — M25512 Pain in left shoulder: Secondary | ICD-10-CM | POA: Diagnosis not present

## 2015-03-07 DIAGNOSIS — I10 Essential (primary) hypertension: Secondary | ICD-10-CM | POA: Diagnosis not present

## 2015-03-07 DIAGNOSIS — E1122 Type 2 diabetes mellitus with diabetic chronic kidney disease: Secondary | ICD-10-CM | POA: Diagnosis not present

## 2015-03-07 DIAGNOSIS — N182 Chronic kidney disease, stage 2 (mild): Secondary | ICD-10-CM | POA: Diagnosis not present

## 2015-03-07 DIAGNOSIS — R079 Chest pain, unspecified: Secondary | ICD-10-CM | POA: Diagnosis not present

## 2015-03-09 DIAGNOSIS — R079 Chest pain, unspecified: Secondary | ICD-10-CM | POA: Diagnosis not present

## 2015-03-15 DIAGNOSIS — M1712 Unilateral primary osteoarthritis, left knee: Secondary | ICD-10-CM | POA: Diagnosis not present

## 2015-03-15 DIAGNOSIS — M7552 Bursitis of left shoulder: Secondary | ICD-10-CM | POA: Diagnosis not present

## 2015-03-20 ENCOUNTER — Encounter: Payer: Self-pay | Admitting: *Deleted

## 2015-03-21 ENCOUNTER — Ambulatory Visit (INDEPENDENT_AMBULATORY_CARE_PROVIDER_SITE_OTHER): Payer: Medicare Other | Admitting: Cardiovascular Disease

## 2015-03-21 ENCOUNTER — Encounter: Payer: Self-pay | Admitting: Cardiovascular Disease

## 2015-03-21 ENCOUNTER — Encounter (INDEPENDENT_AMBULATORY_CARE_PROVIDER_SITE_OTHER): Payer: Self-pay

## 2015-03-21 VITALS — BP 140/68 | HR 71 | Ht 63.0 in | Wt 143.0 lb

## 2015-03-21 DIAGNOSIS — I10 Essential (primary) hypertension: Secondary | ICD-10-CM | POA: Diagnosis not present

## 2015-03-21 DIAGNOSIS — R079 Chest pain, unspecified: Secondary | ICD-10-CM | POA: Insufficient documentation

## 2015-03-21 NOTE — Assessment & Plan Note (Signed)
The chest pain is overall atypical and could be due to GERD. However, she has multiple risk factors for coronary artery disease and thus I recommend further evaluation with a pharmacologic nuclear stress test. She is not able to exercise on a treadmill. She is currently on good medical therapy for her risk factors.

## 2015-03-21 NOTE — Patient Instructions (Addendum)
Medication Instructions:  Your physician recommends that you continue on your current medications as directed. Please refer to the Current Medication list given to you today.  Labwork: NONE  Testing/Procedures: [place]  Your caregiver has ordered a Stress Test with nuclear imaging. The purpose of this test is to evaluate the blood supply to your heart muscle. This procedure is referred to as a "Non-Invasive Stress Test." This is because other than having an IV started in your vein, nothing is inserted or "invades" your body. Cardiac stress tests are done to find areas of poor blood flow to the heart by determining the extent of coronary artery disease (CAD). Some patients exercise on a treadmill, which naturally increases the blood flow to your heart, while others who are  unable to walk on a treadmill due to physical limitations have a pharmacologic/chemical stress agent called Lexiscan . This medicine will mimic walking on a treadmill by temporarily increasing your coronary blood flow.   Please note: these test may take anywhere between 2-4 hours to complete  PLEASE REPORT TO [facility] MEDICAL MALL ENTRANCE  THE VOLUNTEERS AT THE FIRST DESK WILL DIRECT YOU WHERE TO GO  Date of Procedure:_____________________________________  Arrival Time for Procedure:______________________________  Instructions regarding medication:   ____ : Hold diabetes medication morning of procedure  ____:  Hold betablocker(s) night before procedure and morning of procedure  ____:  Hold other medications as 'follows:_________________________________________________________________________________________________________________________________________________________________________________________________________________________________________________________________________________________'$   PLEASE NOTIFY THE OFFICE AT LEAST 24 HOURS IN ADVANCE IF YOU ARE UNABLE TO KEEP YOUR APPOINTMENT.  [phone] AND   PLEASE NOTIFY NUCLEAR MEDICINE AT [facility] AT LEAST 24 HOURS IN ADVANCE IF YOU ARE UNABLE TO KEEP YOUR APPOINTMENT. [phone]  How to prepare for your Myoview test:  1. Do not eat or drink after midnight 2. No caffeine for 24 hours prior to test 3. No smoking 24 hours prior to test. 4. Your medication may be taken with water.  If your doctor stopped a medication because of this test, do not take that medication. 5. Ladies, please do not wear dresses.  Skirts or pants are appropriate. Please wear a short sleeve shirt. 6. No perfume, cologne or lotion. 7. Wear comfortable walking shoes. No heels!     Follow-Up: Follow up with Dr. Fletcher Anon as needed.  Any Other Special Instructions Will Be Listed Below (If Applicable).

## 2015-03-21 NOTE — Assessment & Plan Note (Signed)
Blood pressure is reasonably controlled. 

## 2015-03-21 NOTE — Progress Notes (Signed)
Primary care physician:Dr. Clayborn Bigness  HPI  This is a pleasant 81 year old female who was referred for evaluation of chest pain. She has no previous cardiac history. She has prolonged history of diabetes mellitus currently insulin requiring, hypertension and hypothyroidism. She quit smoking more than 30 years ago. She has no family history of premature coronary artery disease. Her father did have myocardial infarction in his 50s. She reports having rheumatic fever as a child with no subsequent cardiac manifestation. She has been sick since February which started with vertigo and ear infection. This was followed by urinary tract infection. Both of these were treated but she continued to feel weak. She had substernal chest pain described as indigestion radiating to her back which improved with taking antacids. These episodes were not exertional but she has been feeling very tired and fatigued recently. She underwent an echocardiogram at Dr. Laurelyn Sickle office which showed normal LV systolic function, mild to moderate mitral regurgitation and no significant pulmonary hypertension.  Allergies  Allergen Reactions  . Sulfa Antibiotics   . Prednisone Rash     Current Outpatient Prescriptions on File Prior to Visit  Medication Sig Dispense Refill  . allopurinol (ZYLOPRIM) 100 MG tablet Take 100-200 mg by mouth daily.    Marland Kitchen amLODipine (NORVASC) 5 MG tablet Take 5 mg by mouth daily.    Marland Kitchen aspirin 325 MG tablet Take 325 mg by mouth daily.    . bisoprolol-hydrochlorothiazide (ZIAC) 5-6.25 MG per tablet Take 1 tablet by mouth daily.    . citalopram (CELEXA) 20 MG tablet Take 20 mg by mouth daily.    Marland Kitchen docusate sodium (COLACE) 100 MG capsule Take 100 mg by mouth daily.    . Insulin Aspart (NOVOLOG FLEXPEN Lane) Inject 8 Units into the skin daily.    . meloxicam (MOBIC) 7.5 MG tablet Take 7.5 mg by mouth daily.    Marland Kitchen zolpidem (AMBIEN) 5 MG tablet Take 5 mg by mouth at bedtime.      No current  facility-administered medications on file prior to visit.     Past Medical History  Diagnosis Date  . Environmental allergies   . Arthritis   . COPD (chronic obstructive pulmonary disease)   . Diabetes mellitus without complication   . GERD (gastroesophageal reflux disease)   . Hyperlipidemia   . Hypertension   . Hypothyroidism   . Lung cancer   . Hx of rheumatic fever   . Chorea   . Kidney disease   . Mini stroke   . Vertigo   . Hx: UTI (urinary tract infection)   . Ear infection      Past Surgical History  Procedure Laterality Date  . Lung lobectomy    . Thyroid surgery    . Cataract extraction    . Cardiac catheterization      [facility]     Family History  Problem Relation Age of Onset  . Heart attack Father   . Heart disease Father      History   Social History  . Marital Status: Widowed    Spouse Name: N/A  . Number of Children: N/A  . Years of Education: N/A   Occupational History  . Not on file.   Social History Main Topics  . Smoking status: Never Smoker   . Smokeless tobacco: Not on file  . Alcohol Use: No  . Drug Use: No  . Sexual Activity: Not on file   Other Topics Concern  . Not on file  Social History Narrative     ROS A 10 point review of system was performed. It is negative other than that mentioned in the history of present illness.   PHYSICAL EXAM   BP 140/68 mmHg  Pulse 71  Ht '5\' 3"'$  (1.6 m)  Wt 143 lb (64.864 kg)  BMI 25.34 kg/m2 Constitutional: She is oriented to person, place, and time. She appears well-developed and well-nourished. No distress.  HENT: No nasal discharge.  Head: Normocephalic and atraumatic.  Eyes: Pupils are equal and round. No discharge.  Neck: Normal range of motion. Neck supple. No JVD present. No thyromegaly present.  Cardiovascular: Normal rate, regular rhythm, normal heart sounds. Exam reveals no gallop and no friction rub. No murmur heard.  Pulmonary/Chest: Effort normal and breath sounds  normal. No stridor. No respiratory distress. She has no wheezes. She has no rales. She exhibits no tenderness.  Abdominal: Soft. Bowel sounds are normal. She exhibits no distension. There is no tenderness. There is no rebound and no guarding.  Musculoskeletal: Normal range of motion. She exhibits no edema and no tenderness.  Neurological: She is alert and oriented to person, place, and time. Coordination normal.  Skin: Skin is warm and dry. No rash noted. She is not diaphoretic. No erythema. No pallor.  Psychiatric: She has a normal mood and affect. Her behavior is normal. Judgment and thought content normal.     ZOX:WRUEA  Rhythm  WITHIN NORMAL LIMITS   ASSESSMENT AND PLAN

## 2015-03-23 ENCOUNTER — Encounter
Admission: RE | Admit: 2015-03-23 | Discharge: 2015-03-23 | Disposition: A | Discharge status: Home / Self Care | Payer: Medicare Other | Source: Ambulatory Visit | Attending: Cardiovascular Disease | Admitting: Cardiovascular Disease

## 2015-03-23 ENCOUNTER — Ambulatory Visit
Admission: RE | Admit: 2015-03-23 | Discharge: 2015-03-23 | Disposition: A | Discharge status: Home / Self Care | Payer: Medicare Other | Source: Ambulatory Visit | Attending: Cardiovascular Disease | Admitting: Cardiovascular Disease

## 2015-03-23 DIAGNOSIS — R079 Chest pain, unspecified: Secondary | ICD-10-CM | POA: Diagnosis not present

## 2015-03-23 HISTORY — DX: Cerebral infarction, unspecified: I63.9

## 2015-03-23 MED ORDER — REGADENOSON 0.4 MG/5ML IV SOLN
0.4000 mg | Freq: Once | INTRAVENOUS | Status: AC
  Administered 2015-03-23: 0.4 mg via INTRAVENOUS
  Filled 2015-03-23: qty 5

## 2015-03-23 MED ORDER — TECHNETIUM TC 99M SESTAMIBI GENERIC - CARDIOLITE
13.0000 | Freq: Once | INTRAVENOUS | Status: AC | PRN
  Administered 2015-03-23: 13.213 via INTRAVENOUS

## 2015-03-23 MED ORDER — TECHNETIUM TC 99M SESTAMIBI GENERIC - CARDIOLITE
33.0000 | Freq: Once | INTRAVENOUS | Status: AC | PRN
  Administered 2015-03-23: 32.84 via INTRAVENOUS

## 2015-03-24 LAB — NM MYOCAR MULTI W/SPECT W/WALL MOTION / EF
CHL CUP NUCLEAR SRS: 1
CHL CUP STRESS STAGE 1 HR: 64 {beats}/min
CHL CUP STRESS STAGE 2 GRADE: 0 %
CHL CUP STRESS STAGE 3 SPEED: 0 mph
CHL CUP STRESS STAGE 4 HR: 78 {beats}/min
CHL CUP STRESS STAGE 5 DBP: 54 mmHg
CHL CUP STRESS STAGE 5 HR: 73 {beats}/min
CSEPPHR: 78 {beats}/min
Estimated workload: 1 METS
LV sys vol: 15 mL
LVDIAVOL: 41 mL
Nuc Stress EF: 67 %
Percent of predicted max HR: 56 %
Rest HR: 65 {beats}/min
SDS: 6
SSS: 6
Stage 1 Grade: 0 %
Stage 1 Speed: 0 mph
Stage 2 HR: 64 {beats}/min
Stage 2 Speed: 0 mph
Stage 3 Grade: 0 %
Stage 3 HR: 63 {beats}/min
Stage 4 Grade: 0 %
Stage 4 Speed: 0 mph
Stage 5 Grade: 0 %
Stage 5 SBP: 142 mmHg
Stage 5 Speed: 0 mph
TID: 1.19

## 2015-04-09 DIAGNOSIS — N390 Urinary tract infection, site not specified: Secondary | ICD-10-CM | POA: Diagnosis not present

## 2015-04-17 DIAGNOSIS — E1122 Type 2 diabetes mellitus with diabetic chronic kidney disease: Secondary | ICD-10-CM | POA: Diagnosis not present

## 2015-04-17 DIAGNOSIS — Z0001 Encounter for general adult medical examination with abnormal findings: Secondary | ICD-10-CM | POA: Diagnosis not present

## 2015-04-17 DIAGNOSIS — F3341 Major depressive disorder, recurrent, in partial remission: Secondary | ICD-10-CM | POA: Diagnosis not present

## 2015-04-17 DIAGNOSIS — I10 Essential (primary) hypertension: Secondary | ICD-10-CM | POA: Diagnosis not present

## 2015-04-17 DIAGNOSIS — M10032 Idiopathic gout, left wrist: Secondary | ICD-10-CM | POA: Diagnosis not present

## 2015-04-17 DIAGNOSIS — N390 Urinary tract infection, site not specified: Secondary | ICD-10-CM | POA: Diagnosis not present

## 2015-05-18 DIAGNOSIS — N390 Urinary tract infection, site not specified: Secondary | ICD-10-CM | POA: Diagnosis not present

## 2015-05-18 DIAGNOSIS — M130 Polyarthritis, unspecified: Secondary | ICD-10-CM | POA: Diagnosis not present

## 2015-05-18 DIAGNOSIS — N182 Chronic kidney disease, stage 2 (mild): Secondary | ICD-10-CM | POA: Diagnosis not present

## 2015-05-18 DIAGNOSIS — R5383 Other fatigue: Secondary | ICD-10-CM | POA: Diagnosis not present

## 2015-05-18 DIAGNOSIS — E039 Hypothyroidism, unspecified: Secondary | ICD-10-CM | POA: Diagnosis not present

## 2015-05-18 DIAGNOSIS — M1A0390 Idiopathic chronic gout, unspecified wrist, without tophus (tophi): Secondary | ICD-10-CM | POA: Diagnosis not present

## 2015-05-18 DIAGNOSIS — E1122 Type 2 diabetes mellitus with diabetic chronic kidney disease: Secondary | ICD-10-CM | POA: Diagnosis not present

## 2015-05-18 DIAGNOSIS — N952 Postmenopausal atrophic vaginitis: Secondary | ICD-10-CM | POA: Diagnosis not present

## 2015-05-29 DIAGNOSIS — J0191 Acute recurrent sinusitis, unspecified: Secondary | ICD-10-CM | POA: Diagnosis not present

## 2015-06-09 DIAGNOSIS — M7551 Bursitis of right shoulder: Secondary | ICD-10-CM | POA: Diagnosis not present

## 2015-06-14 DIAGNOSIS — I10 Essential (primary) hypertension: Secondary | ICD-10-CM | POA: Diagnosis not present

## 2015-06-14 DIAGNOSIS — E1165 Type 2 diabetes mellitus with hyperglycemia: Secondary | ICD-10-CM | POA: Diagnosis not present

## 2015-06-14 DIAGNOSIS — M064 Inflammatory polyarthropathy: Secondary | ICD-10-CM | POA: Diagnosis not present

## 2015-06-14 DIAGNOSIS — R5383 Other fatigue: Secondary | ICD-10-CM | POA: Diagnosis not present

## 2015-06-14 DIAGNOSIS — M130 Polyarthritis, unspecified: Secondary | ICD-10-CM | POA: Diagnosis not present

## 2015-06-14 DIAGNOSIS — N390 Urinary tract infection, site not specified: Secondary | ICD-10-CM | POA: Diagnosis not present

## 2015-06-14 DIAGNOSIS — E039 Hypothyroidism, unspecified: Secondary | ICD-10-CM | POA: Diagnosis not present

## 2015-06-29 DIAGNOSIS — B2790 Infectious mononucleosis, unspecified without complication: Secondary | ICD-10-CM | POA: Diagnosis not present

## 2015-06-29 DIAGNOSIS — E1165 Type 2 diabetes mellitus with hyperglycemia: Secondary | ICD-10-CM | POA: Diagnosis not present

## 2015-07-24 ENCOUNTER — Ambulatory Visit: Payer: Medicare Other | Admitting: Physical Therapy

## 2015-07-31 ENCOUNTER — Ambulatory Visit: Payer: Medicare Other | Attending: Physician Assistant | Admitting: Physical Therapy

## 2015-07-31 DIAGNOSIS — J45991 Cough variant asthma: Secondary | ICD-10-CM | POA: Diagnosis not present

## 2015-07-31 DIAGNOSIS — M25551 Pain in right hip: Secondary | ICD-10-CM | POA: Insufficient documentation

## 2015-07-31 DIAGNOSIS — M25552 Pain in left hip: Secondary | ICD-10-CM

## 2015-07-31 DIAGNOSIS — E1165 Type 2 diabetes mellitus with hyperglycemia: Secondary | ICD-10-CM | POA: Diagnosis not present

## 2015-07-31 DIAGNOSIS — R262 Difficulty in walking, not elsewhere classified: Secondary | ICD-10-CM | POA: Insufficient documentation

## 2015-07-31 NOTE — Therapy (Signed)
[place] [facility] [address] [place], [address] Phone: [phone]   Fax:  [phone]  Physical Therapy Evaluation  Patient Details  Name: Samantha Franco MRN: [id number] Date of Birth: August 07, 1929 Referring Provider:  Christie Nottingham, [place]  Encounter Date: 07/31/2015      PT End of Session - 07/31/15 1117    Visit Number 1   Number of Visits 13   Date for PT Re-Evaluation 09/11/15   Authorization Type 1   Authorization Time Period 10   PT Start Time 1020   PT Stop Time 1100   PT Time Calculation (min) 40 min   Activity Tolerance No increased pain;Patient tolerated treatment well   Behavior During Therapy [facility] for tasks assessed/performed      Past Medical History  Diagnosis Date  . Environmental allergies   . Arthritis   . COPD (chronic obstructive pulmonary disease)   . Diabetes mellitus without complication   . GERD (gastroesophageal reflux disease)   . Hyperlipidemia   . Hypertension   . Hypothyroidism   . Lung cancer   . Hx of rheumatic fever   . Chorea   . Kidney disease   . Mini stroke   . Vertigo   . Hx: UTI (urinary tract infection)   . Ear infection   . Stroke     x's 2  . Hyperlipidemia     Past Surgical History  Procedure Laterality Date  . Lung lobectomy    . Thyroid surgery    . Cataract extraction    . Cardiac catheterization      [facility]    There were no vitals filed for this visit.  Visit Diagnosis:  Pain in joint, pelvic region and thigh, left - Plan: PT plan of care cert/re-cert  Difficulty walking - Plan: PT plan of care cert/re-cert      Subjective Assessment - 07/31/15 1101    Subjective Patient is reporting pain that seems to move to different areas. Reports it is in her back today and into her left hip.    Limitations Standing;Walking   Currently in Pain? Yes   Pain Score 6    Pain Location Back   Pain Orientation Posterior;Lower   Pain Descriptors /  Indicators Aching;Nagging   Pain Type Chronic pain   Pain Onset More than a month ago   Pain Frequency Intermittent   Multiple Pain Sites Yes   Pain Score 6   Pain Location Hip   Pain Orientation Left   Pain Descriptors / Indicators Aching   Pain Type Chronic pain   Pain Onset More than a month ago   Pain Frequency Intermittent            [facility] PT Assessment - 07/31/15 0001    Assessment   Medical Diagnosis difficulty walking   Onset Date/Surgical Date 07/30/14   Prior Therapy none   Precautions   Precautions None   Restrictions   Weight Bearing Restrictions No   Balance Screen   Has the patient fallen in the past 6 months No   Has the patient had a decrease in activity level because of a fear of falling?  Yes   Is the patient reluctant to leave their home because of a fear of falling?  No   Home Environment   Living Environment Private residence   Living Arrangements Alone   Type of Homeland One level   Home Equipment  None   Prior Function   Level of Independence Independent   Vocation Retired   Charity fundraiser Status Within Functional Limits for tasks assessed   Sensation   Light Touch Appears Intact   Posture/Postural Control   Posture/Postural Control No significant limitations   ROM / Strength   AROM / PROM / Strength AROM;Strength   AROM   Overall AROM  Within functional limits for tasks performed   AROM Assessment Site Shoulder;Knee;Hip;Lumbar   Right/Left Shoulder Left   Strength   Overall Strength Within functional limits for tasks performed   Transfers   Five time sit to stand comments  32 seconds   Ambulation/Gait   Ambulation/Gait Yes   Ambulation/Gait Assistance 7: Independent   Ambulation Distance (Feet) 200 Feet   Gait Pattern Decreased trunk rotation;Poor foot clearance - left   Ambulation Surface Level   Stairs Yes   Stairs Assistance 6: Modified independent (Device/Increase time)   Stair Management  Technique Two rails   Number of Stairs 4   Balance   Balance Assessed Yes   Standardized Balance Assessment   Standardized Balance Assessment Berg Balance Test   Berg Balance Test   Sit to Stand Able to stand without using hands and stabilize independently   Standing Unsupported Able to stand safely 2 minutes   Sitting with Back Unsupported but Feet Supported on Floor or Stool Able to sit safely and securely 2 minutes   Stand to Sit Controls descent by using hands   Transfers Able to transfer safely, definite need of hands   Standing Unsupported with Eyes Closed Able to stand 10 seconds safely   Standing Ubsupported with Feet Together Able to place feet together independently and stand 1 minute safely   From Standing, Reach Forward with Outstretched Arm Can reach forward >12 cm safely (5")   From Standing Position, Pick up Object from Floor Able to pick up shoe safely and easily   From Standing Position, Turn to Look Behind Over each Shoulder Looks behind one side only/other side shows less weight shift   Turn 360 Degrees Able to turn 360 degrees safely in 4 seconds or less   Standing Unsupported, Alternately Place Feet on Step/Stool Able to complete 4 steps without aid or supervision   Standing Unsupported, One Foot in Front Able to plae foot ahead of the other independently and hold 30 seconds   Standing on One Leg Able to lift leg independently and hold equal to or more than 3 seconds   Total Score 47   Functional Gait  Assessment   Gait assessed  Yes   Gait and Pivot Turn Pivot turns safely within 3 sec and stops quickly with no loss of balance.      Treatment this session:  Side stepping x 2 reps of 6' cues for posture.  Hip extension x 5 each side with hha on rail cues for correct form Hip flexion x 5 each with hha on rail for safety. Cues for correct form and posture.          PT Education - 07/31/15 1117    Education provided Yes   Education Details POC, frequency of  PT.   Person(s) Educated Patient   Methods Explanation   Comprehension Verbalized understanding             PT Long Term Goals - 07/31/15 1123    PT LONG TERM GOAL #1   Title Patient will be independent with HEP for improved  mobility and decreased pain with daily activities.    Baseline 6/10 pain   Time 4   Period Weeks   Status New   PT LONG TERM GOAL #2   Title Patient will report < 4/10 pain on pain scale for improved functional mobility and independence.    Baseline 6/10 pain   Time 6   Period Weeks   Status New   PT LONG TERM GOAL #3   Title Patient will improve 5xSTS to < 20 seconds for eas of getting up and down.   Baseline 32 sec   Time 6   Period Weeks   Status New   PT LONG TERM GOAL #4   Title Patient will improve TUG to < 12 seconds for decreased fall risk.   Baseline 17 sec   Time 6   Period Weeks   Status New               Plan - 08-20-15 1118    Clinical Impression Statement Patient is an 81 year old female who reports back pain, left hip pain. Difficulty getting up and down from chair. Difficulty walking. Demonstrates decreased hip mobility with gait and slow gait speed. Patient also has left shoulder weakness and difficulty with range of motion on the left. Patient's BErg balance test scored at 47/56 however she reports today is a good day for her balance and there are times when her balance is worse.    Pt will benefit from skilled therapeutic intervention in order to improve on the following deficits Abnormal gait;Decreased activity tolerance;Decreased strength;Pain;Difficulty walking;Decreased range of motion   Rehab Potential Good   PT Frequency 2x / week   PT Duration 6 weeks   PT Treatment/Interventions Gait training;Therapeutic activities;Therapeutic exercise;Balance training;Patient/family education;Passive range of motion;Manual techniques   PT Next Visit Plan work on general mobility and strengthening, Nustep, hip mobility, balance as  needed.     Consulted and Agree with Plan of Care Patient          G-Codes - 2015/08/20 1127    Functional Assessment Tool Used BERG, TUG, 5 x STS, clinical judgement   Functional Limitation Mobility: Walking and moving around   Mobility: Walking and Moving Around Current Status [phone]) At least 20 percent but less than 40 percent impaired, limited or restricted   Mobility: Walking and Moving Around Goal Status [id number]) At least 1 percent but less than 20 percent impaired, limited or restricted       Problem List Patient Active Problem List   Diagnosis Date Noted  . Pain in the chest 03/21/2015  . Essential hypertension 03/21/2015    Jamie Hafford, PT, MPT, GCS 08-20-15, 11:29 AM  [place] [facility] [address], [place], [address] Phone: [phone]   Fax:  [phone]

## 2015-08-03 ENCOUNTER — Ambulatory Visit: Payer: Medicare Other | Admitting: Physical Therapy

## 2015-08-03 DIAGNOSIS — M25551 Pain in right hip: Secondary | ICD-10-CM | POA: Diagnosis not present

## 2015-08-03 DIAGNOSIS — M25552 Pain in left hip: Secondary | ICD-10-CM

## 2015-08-03 DIAGNOSIS — R262 Difficulty in walking, not elsewhere classified: Secondary | ICD-10-CM | POA: Diagnosis not present

## 2015-08-03 NOTE — Therapy (Signed)
[place] [facility] [address], [place], [address] Phone: [phone]   Fax:  [phone]  Physical Therapy Treatment  Patient Details  Name: Samantha Franco MRN: [id number] Date of Birth: [birth year] Referring Provider:  Christie Nottingham, [place]  Encounter Date: 08/03/2015      PT End of Session - 08/03/15 1204    Visit Number 2   Number of Visits 13   Date for PT Re-Evaluation 09/11/15   Authorization Type 2   Authorization Time Period 10   PT Start Time 1115   PT Stop Time 1155   PT Time Calculation (min) 40 min   Activity Tolerance Patient tolerated treatment well;No increased pain;Patient limited by pain   Behavior During Therapy [facility] for tasks assessed/performed      Past Medical History  Diagnosis Date  . Environmental allergies   . Arthritis   . COPD (chronic obstructive pulmonary disease)   . Diabetes mellitus without complication   . GERD (gastroesophageal reflux disease)   . Hyperlipidemia   . Hypertension   . Hypothyroidism   . Lung cancer   . Hx of rheumatic fever   . Chorea   . Kidney disease   . Mini stroke   . Vertigo   . Hx: UTI (urinary tract infection)   . Ear infection   . Stroke     x's 2  . Hyperlipidemia     Past Surgical History  Procedure Laterality Date  . Lung lobectomy    . Thyroid surgery    . Cataract extraction    . Cardiac catheterization      [facility]    There were no vitals filed for this visit.  Visit Diagnosis:  Pain in joint, pelvic region and thigh, left  Difficulty walking      Subjective Assessment - 08/03/15 1201    Subjective Patient reports right shoulder pain and hip pain L > R.   Limitations Sitting;Walking   Currently in Pain? Yes   Pain Score 3    Pain Location Hip   Pain Orientation Left;Right   Pain Descriptors / Indicators Aching   Pain Type Chronic pain   Pain Onset More than a month ago   Pain Frequency Intermittent   Multiple Pain  Sites Yes   Pain Location Shoulder   Pain Orientation Right   Pain Descriptors / Indicators Aching;Sore;Tender   Pain Type Chronic pain   Pain Onset More than a month ago   Pain Frequency Intermittent      Treatment to include: Nu step level 2 x 5 min Standing exercises to include: heel raises, hip abduction, hip extension, marching x 20 reps bilaterally. Patient has some hip discomfort with abd and extension therefore cued patient to stay within comfortable range.  Seated exercises: Yellow TB hip abd, ball squeezes, laq x 20 Sidestepping, backward stepping, wedding march gait(lateral stepping) x 6 laps x 6 feet.  Shoulder retraction and extension with yellow TBx 20, Trunk twists with yellow TB x 10 to each side. Cues needed for proper form and supervision needed for safety.         PT Education - 08/03/15 1203    Education provided Yes   Education Details proper form with exercises, safety with activities.    Person(s) Educated Patient   Methods Explanation;Demonstration   Comprehension Verbalized understanding;Returned demonstration             PT Long Term Goals - 07/31/15 1123  PT LONG TERM GOAL #1   Title Patient will be independent with HEP for improved mobility and decreased pain with daily activities.    Baseline 6/10 pain   Time 4   Period Weeks   Status New   PT LONG TERM GOAL #2   Title Patient will report < 4/10 pain on pain scale for improved functional mobility and independence.    Baseline 6/10 pain   Time 6   Period Weeks   Status New   PT LONG TERM GOAL #3   Title Patient will improve 5xSTS to < 20 seconds for eas of getting up and down.   Baseline 32 sec   Time 6   Period Weeks   Status New   PT LONG TERM GOAL #4   Title Patient will improve TUG to < 12 seconds for decreased fall risk.   Baseline 17 sec   Time 6   Period Weeks   Status New               Plan - 08/03/15 1205    Clinical Impression Statement Patient tolerated  activities well during first session. Has left hip pain > R hip, lumbar discomfort with trunk twists and hip pain/stiffness. She reports that her pain moves around and that she is scheduled to see Dr. Lindon Romp maybe next week for arthritis.    Pt will benefit from skilled therapeutic intervention in order to improve on the following deficits Abnormal gait;Decreased activity tolerance;Decreased strength;Pain;Difficulty walking;Decreased range of motion   Rehab Potential Good   PT Frequency 2x / week   PT Duration 6 weeks   PT Treatment/Interventions Gait training;Therapeutic activities;Therapeutic exercise;Balance training;Patient/family education;Passive range of motion;Manual techniques   PT Next Visit Plan work on general mobility and strengthening, Nustep, hip mobility, balance as needed.     Consulted and Agree with Plan of Care Patient        Problem List Patient Active Problem List   Diagnosis Date Noted  . Pain in the chest 03/21/2015  . Essential hypertension 03/21/2015    Conetta,Kristyn, PT, MPT, GCS 08/03/2015, 12:08 PM  [place] [facility] [address], [place], [address] Phone: [phone]   Fax:  [phone]

## 2015-08-07 ENCOUNTER — Ambulatory Visit: Payer: Medicare Other | Admitting: Physical Therapy

## 2015-08-07 DIAGNOSIS — R262 Difficulty in walking, not elsewhere classified: Secondary | ICD-10-CM

## 2015-08-07 DIAGNOSIS — M25552 Pain in left hip: Secondary | ICD-10-CM | POA: Diagnosis not present

## 2015-08-07 DIAGNOSIS — M25551 Pain in right hip: Secondary | ICD-10-CM | POA: Diagnosis not present

## 2015-08-07 NOTE — Patient Instructions (Addendum)
All exercises provided were adapted from [email or web address]. Patient was provided a written handout with pictures as described. Any additional cues were manually entered in to handout and copied in to this document.     Hip Flexor Stretch (8times, 5 second holds, 2x per day)   Lean against wall. Back leg straight but lift heel up off floor. Bend front leg. Lean forward until you feel stretch in front of hip of straight leg.   Sidestepping (10x for 2 sets)   Start: Partial squat position with feet together  Movement: Move laterally separating your feet and then bringing them back together.  *Note-Mark off 15-20 ft  up and back is one lap, maintain a partial squat position and build up your speed.  Supine bridge (10x for 3 sets)   Lie down on your back and bend your knees so that your feet are flat on the table. Press your heels into the ground to lift your hips off of the table. You should stabilize through your core. Return to the starting position controlling your speed.

## 2015-08-07 NOTE — Therapy (Signed)
[facility] [address], [place], [address] Phone: [phone]   Fax:  [phone]  Physical Therapy Treatment  Patient Details  Name: Samantha Franco MRN: [id number] Date of Birth: 1929-01-20 Referring Provider:  Christie Nottingham, [place]  Encounter Date: 08/07/2015      PT End of Session - 08/07/15 1524    Visit Number 3   Number of Visits 13   Date for PT Re-Evaluation 09/11/15   Authorization Type 3   Authorization Time Period 10   PT Start Time 1430   PT Stop Time 1515   PT Time Calculation (min) 45 min   Activity Tolerance Patient tolerated treatment well;No increased pain   Behavior During Therapy [facility] for tasks assessed/performed      Past Medical History  Diagnosis Date  . Environmental allergies   . Arthritis   . COPD (chronic obstructive pulmonary disease)   . Diabetes mellitus without complication   . GERD (gastroesophageal reflux disease)   . Hyperlipidemia   . Hypertension   . Hypothyroidism   . Lung cancer   . Hx of rheumatic fever   . Chorea   . Kidney disease   . Mini stroke   . Vertigo   . Hx: UTI (urinary tract infection)   . Ear infection   . Stroke     x's 2  . Hyperlipidemia     Past Surgical History  Procedure Laterality Date  . Lung lobectomy    . Thyroid surgery    . Cataract extraction    . Cardiac catheterization      [facility]    There were no vitals filed for this visit.  Visit Diagnosis:  Pain in joint, pelvic region and thigh, left  Difficulty walking      Subjective Assessment - 08/07/15 1429    Subjective Patient reports her R shoulder is less painful today. Patient reports L shoulder is feeling better as well. She does report L sided hip and lower back pain. She denies any numbness or tingling in her feet.    Limitations Sitting;Walking   Diagnostic tests History of 2 "small strokes"    Currently in Pain? Yes   Pain Score 6   "it's not that bad,  it's getting better"   Pain Location Hip   Pain Orientation Left   Pain Descriptors / Indicators Aching   Pain Type Chronic pain   Pain Onset More than a month ago   Pain Frequency Intermittent   Multiple Pain Sites No        Long axis distraction grade II-III very well tolerated, decreased reported pain symptoms.   Supine bridging x 10 for 3 sets, appropriate technique noted.   Piriformis figure 4 stretching, had to modify for pain relief (less flexion), very mild pain relief.   Supine lumbar rocking side to side x 10 with gentle manual overpressure (no change in symptoms)   OMEGA Leg Press x 20# for 10 reps "easy", 35# x 10 repetitions (felt relief, even though challenging)   Side stepping with yellow t-band x 10 bilaterally, appropriate technique   Retro walking ("monster walk") with yellow t-band to increase hip flexor stretch and increase gluteal activity x 10 bilaterally   Standing hip flexor stretch via leaning towards wall, cuing to not let her LLE off the floor and shift her weight from L side to R side. Reduced symptoms   Supine belt mobilizations for her hip into ER/abduction, significantly  reduced perceived symptoms.                          PT Education - 08/07/15 1531    Education provided Yes   Education Details Exercise technique and rationale, neuroscience mechanisms of pain.    Person(s) Educated Patient   Methods Explanation;Demonstration;Handout   Comprehension Verbalized understanding;Returned demonstration             PT Long Term Goals - 07/31/15 1123    PT LONG TERM GOAL #1   Title Patient will be independent with HEP for improved mobility and decreased pain with daily activities.    Baseline 6/10 pain   Time 4   Period Weeks   Status New   PT LONG TERM GOAL #2   Title Patient will report < 4/10 pain on pain scale for improved functional mobility and independence.    Baseline 6/10 pain   Time 6   Period Weeks    Status New   PT LONG TERM GOAL #3   Title Patient will improve 5xSTS to < 20 seconds for eas of getting up and down.   Baseline 32 sec   Time 6   Period Weeks   Status New   PT LONG TERM GOAL #4   Title Patient will improve TUG to < 12 seconds for decreased fall risk.   Baseline 17 sec   Time 6   Period Weeks   Status New               Plan - 08/07/15 1524    Clinical Impression Statement Patient reports decreased hip pain with long axis distractions and posterolateral hip strengthening. Patient reports that she does not experience the same level of pain with initial provactive testing. Testing indicated pain with hip flexion, IR, ER. Scouring was mildly painful, in her pirifiromis gluteal region and into her groin area, consistent with OA. Patientfound relief with belt mobilizations into ER/abduction as well. Patient demonstrates significant weakness in her LEs, which is often involved in OA related pain.    Pt will benefit from skilled therapeutic intervention in order to improve on the following deficits Abnormal gait;Decreased activity tolerance;Decreased strength;Pain;Difficulty walking;Decreased range of motion   Rehab Potential Good   PT Frequency 2x / week   PT Duration 6 weeks   PT Treatment/Interventions Gait training;Therapeutic activities;Therapeutic exercise;Balance training;Patient/family education;Passive range of motion;Manual techniques   PT Next Visit Plan Manual techniques to facilitate distraction/ER/abduction as well as generalized LE strengthening. Examine UEs as able.    PT Home Exercise Plan See patient instructions.    Consulted and Agree with Plan of Care Patient        Problem List Patient Active Problem List   Diagnosis Date Noted  . Pain in the chest 03/21/2015  . Essential hypertension 03/21/2015    Kerman Passey, PT, DPT    08/07/2015, 3:39 PM  [facility] [address] [place], [address] Phone: [phone]   Fax:  [phone]

## 2015-08-08 DIAGNOSIS — M25562 Pain in left knee: Secondary | ICD-10-CM | POA: Diagnosis not present

## 2015-08-08 DIAGNOSIS — M25561 Pain in right knee: Secondary | ICD-10-CM | POA: Diagnosis not present

## 2015-08-08 DIAGNOSIS — M25511 Pain in right shoulder: Secondary | ICD-10-CM | POA: Diagnosis not present

## 2015-08-08 DIAGNOSIS — M150 Primary generalized (osteo)arthritis: Secondary | ICD-10-CM | POA: Diagnosis not present

## 2015-08-08 DIAGNOSIS — G8929 Other chronic pain: Secondary | ICD-10-CM | POA: Diagnosis not present

## 2015-08-08 DIAGNOSIS — M1A00X0 Idiopathic chronic gout, unspecified site, without tophus (tophi): Secondary | ICD-10-CM | POA: Diagnosis not present

## 2015-08-11 ENCOUNTER — Ambulatory Visit: Payer: Medicare Other | Admitting: Physical Therapy

## 2015-08-11 DIAGNOSIS — M25551 Pain in right hip: Secondary | ICD-10-CM

## 2015-08-11 DIAGNOSIS — M25552 Pain in left hip: Secondary | ICD-10-CM | POA: Diagnosis not present

## 2015-08-11 DIAGNOSIS — R262 Difficulty in walking, not elsewhere classified: Secondary | ICD-10-CM | POA: Diagnosis not present

## 2015-08-11 NOTE — Therapy (Signed)
[facility] [address] [place], [address] Phone: [phone]   Fax:  [phone]  Physical Therapy Treatment  Patient Details  Name: Samantha Franco MRN: [id number] Date of Birth: [birth year] Referring Provider:  Christie Nottingham, [place]  Encounter Date: 08/11/2015      PT End of Session - 08/11/15 1126    Visit Number 4   Number of Visits 13   Date for PT Re-Evaluation 09/11/15   Authorization Type 4   Authorization Time Period 10   PT Start Time 4132   PT Stop Time 1100   PT Time Calculation (min) 45 min   Activity Tolerance Patient tolerated treatment well;No increased pain   Behavior During Therapy [facility] for tasks assessed/performed      Past Medical History  Diagnosis Date  . Environmental allergies   . Arthritis   . COPD (chronic obstructive pulmonary disease)   . Diabetes mellitus without complication   . GERD (gastroesophageal reflux disease)   . Hyperlipidemia   . Hypertension   . Hypothyroidism   . Lung cancer   . Hx of rheumatic fever   . Chorea   . Kidney disease   . Mini stroke   . Vertigo   . Hx: UTI (urinary tract infection)   . Ear infection   . Stroke     x's 2  . Hyperlipidemia     Past Surgical History  Procedure Laterality Date  . Lung lobectomy    . Thyroid surgery    . Cataract extraction    . Cardiac catheterization      [facility]    There were no vitals filed for this visit.  Visit Diagnosis:  Right hip pain      Subjective Assessment - 08/11/15 1122    Subjective Patient reports her right hip is hurting.    Limitations Sitting;Walking   Diagnostic tests History of 2 "small strokes"    Currently in Pain? Yes   Pain Score 5    Pain Location Hip   Pain Orientation Right;Lateral   Pain Descriptors / Indicators Aching;Tender   Pain Type Chronic pain   Pain Onset More than a month ago   Pain Frequency Constant   Multiple Pain Sites No           Treatment to include: Nu step level 3 x 5 min Standing exercises to include:  hip abduction, hip extension, x 20 reps bilaterally. Patient has some hip discomfort with abd and extension therefore cued patient to stay within comfortable range.  Seated exercises: Red TB hip abd x 20,  Sidestepping,  wedding march gait(lateral stepping) x 2 laps x 20 feet. Close supervision to cga needed for decreased balance.  Right hip joint distraction, posterior joint mob, lateral and medial joint mobs x 5 reps each. Pt reports relief with distraction.  Supine lumbar rotations x 2 min. Patient reports relief from this.  R UE assessed for PROM. Pain with full flexion reported, however patient has full range, therefore will work on pain control and strengthening. Joint mobs AP to right shoulder 5 bouts x 30 sec grade II.   Patient reports improvement in pain symptoms after therapy.               PT Education - 08/11/15 1124    Education provided Yes   Education Details exercises, to take NSAIDs when pain is more severe.    Person(s) Educated Patient   Methods Explanation;Demonstration;Verbal  cues   Comprehension Verbalized understanding;Returned demonstration             PT Long Term Goals - 07/31/15 1123    PT LONG TERM GOAL #1   Title Patient will be independent with HEP for improved mobility and decreased pain with daily activities.    Baseline 6/10 pain   Time 4   Period Weeks   Status New   PT LONG TERM GOAL #2   Title Patient will report < 4/10 pain on pain scale for improved functional mobility and independence.    Baseline 6/10 pain   Time 6   Period Weeks   Status New   PT LONG TERM GOAL #3   Title Patient will improve 5xSTS to < 20 seconds for eas of getting up and down.   Baseline 32 sec   Time 6   Period Weeks   Status New   PT LONG TERM GOAL #4   Title Patient will improve TUG to < 12 seconds for decreased fall risk.   Baseline 17 sec   Time 6   Period Weeks    Status New               Plan - 08/11/15 1126    Clinical Impression Statement Patient is having various painful sites depending on the day. This session patient reporting right hip pain. Has continued right shoulder weakness and difficulty lifting.    Pt will benefit from skilled therapeutic intervention in order to improve on the following deficits Abnormal gait;Decreased activity tolerance;Decreased strength;Pain;Difficulty walking;Decreased range of motion   Rehab Potential Good   PT Frequency 2x / week   PT Duration 6 weeks   PT Treatment/Interventions Gait training;Therapeutic activities;Therapeutic exercise;Balance training;Patient/family education;Passive range of motion;Manual techniques   PT Next Visit Plan work on right UE strengthening and balance as well as LE strengthening and manual techniques for decreased pain.    PT Home Exercise Plan See patient instructions.    Consulted and Agree with Plan of Care Patient        Problem List Patient Active Problem List   Diagnosis Date Noted  . Pain in the chest 03/21/2015  . Essential hypertension 03/21/2015    Zykiria Bruening, PT, MPT, GCS 08/11/2015, 11:31 AM  [facility] [address], [place], [address] Phone: [phone]   Fax:  [phone]

## 2015-08-14 ENCOUNTER — Ambulatory Visit: Payer: Medicare Other | Attending: Physician Assistant | Admitting: Physical Therapy

## 2015-08-14 DIAGNOSIS — M25511 Pain in right shoulder: Secondary | ICD-10-CM | POA: Insufficient documentation

## 2015-08-14 DIAGNOSIS — M25512 Pain in left shoulder: Secondary | ICD-10-CM | POA: Insufficient documentation

## 2015-08-14 DIAGNOSIS — E1165 Type 2 diabetes mellitus with hyperglycemia: Secondary | ICD-10-CM | POA: Diagnosis not present

## 2015-08-14 DIAGNOSIS — N390 Urinary tract infection, site not specified: Secondary | ICD-10-CM | POA: Diagnosis not present

## 2015-08-14 DIAGNOSIS — R202 Paresthesia of skin: Secondary | ICD-10-CM | POA: Insufficient documentation

## 2015-08-14 DIAGNOSIS — R262 Difficulty in walking, not elsewhere classified: Secondary | ICD-10-CM | POA: Diagnosis not present

## 2015-08-14 DIAGNOSIS — M1A0390 Idiopathic chronic gout, unspecified wrist, without tophus (tophi): Secondary | ICD-10-CM | POA: Diagnosis not present

## 2015-08-14 DIAGNOSIS — B2790 Infectious mononucleosis, unspecified without complication: Secondary | ICD-10-CM | POA: Diagnosis not present

## 2015-08-14 DIAGNOSIS — M25552 Pain in left hip: Secondary | ICD-10-CM | POA: Insufficient documentation

## 2015-08-14 DIAGNOSIS — M25551 Pain in right hip: Secondary | ICD-10-CM | POA: Diagnosis not present

## 2015-08-14 NOTE — Therapy (Signed)
[facility] [address], [place], [address] Phone: [phone]   Fax:  [phone]  Physical Therapy Treatment  Patient Details  Name: Samantha Franco MRN: [id number] Date of Birth: 10-27-1929 Referring Provider:  Christie Nottingham, [place]  Encounter Date: 08/14/2015      PT End of Session - 08/14/15 0929    Visit Number 5   Number of Visits 13   Authorization Type 5   Authorization Time Period 10   PT Start Time 0845   PT Stop Time 0928   PT Time Calculation (min) 43 min   Activity Tolerance Patient tolerated treatment well;No increased pain   Behavior During Therapy [facility] for tasks assessed/performed      Past Medical History  Diagnosis Date  . Environmental allergies   . Arthritis   . COPD (chronic obstructive pulmonary disease)   . Diabetes mellitus without complication   . GERD (gastroesophageal reflux disease)   . Hyperlipidemia   . Hypertension   . Hypothyroidism   . Lung cancer   . Hx of rheumatic fever   . Chorea   . Kidney disease   . Mini stroke   . Vertigo   . Hx: UTI (urinary tract infection)   . Ear infection   . Stroke     x's 2  . Hyperlipidemia     Past Surgical History  Procedure Laterality Date  . Lung lobectomy    . Thyroid surgery    . Cataract extraction    . Cardiac catheterization      [facility]    There were no vitals filed for this visit.  Visit Diagnosis:  Right hip pain  Pain in joint, pelvic region and thigh, left      Subjective Assessment - 08/14/15 0926    Subjective I feel pretty good today. Reports only mild bilateral hip pain.    Limitations Sitting;Walking   Diagnostic tests History of 2 "small strokes"    Currently in Pain? Yes   Pain Score 2    Pain Location Hip   Pain Orientation Right;Left   Pain Descriptors / Indicators Aching   Multiple Pain Sites No        Treatment to include: Nu step level 3 x 6 min Standing exercises to include:  hip abduction, hip extension, x 20 reps bilaterally. Patient has some hip discomfort with abd and extension therefore cued patient to stay within comfortable range.  Seated exercises: Red TB hip abd x 20,  Sidestepping, wedding march gait(lateral stepping) x 2 laps x 20 feet. Close supervision to cga needed for decreased balance.  Right hip joint distraction, posterior joint mob, lateral and medial joint mobs x 5 reps each. Pt reports relief with distraction.  Supine lumbar rotations x 2 min. Patient reports relief from this.  SHoulder retraction with yellow band, shoulder extension with yellow band x 15 reps of each with cues for correct form.   Patient reports improvement in pain symptoms after therapy.             PT Education - 08/14/15 [phone]    Education provided Yes   Education Details exercises, proper form   Person(s) Educated Patient   Methods Demonstration;Explanation   Comprehension Verbalized understanding;Returned demonstration;Verbal cues required             PT Long Term Goals - 07/31/15 1123    PT LONG TERM GOAL #1   Title Patient will be independent with  HEP for improved mobility and decreased pain with daily activities.    Baseline 6/10 pain   Time 4   Period Weeks   Status New   PT LONG TERM GOAL #2   Title Patient will report < 4/10 pain on pain scale for improved functional mobility and independence.    Baseline 6/10 pain   Time 6   Period Weeks   Status New   PT LONG TERM GOAL #3   Title Patient will improve 5xSTS to < 20 seconds for eas of getting up and down.   Baseline 32 sec   Time 6   Period Weeks   Status New   PT LONG TERM GOAL #4   Title Patient will improve TUG to < 12 seconds for decreased fall risk.   Baseline 17 sec   Time 6   Period Weeks   Status New               Plan - 08/14/15 0929    Clinical Impression Statement Patient reporting decreased pain with continued sessions. improved overall mobility.    Pt  will benefit from skilled therapeutic intervention in order to improve on the following deficits Abnormal gait;Decreased activity tolerance;Decreased strength;Pain;Difficulty walking;Decreased range of motion   Rehab Potential Good   PT Frequency 2x / week   PT Duration 6 weeks   PT Treatment/Interventions Gait training;Therapeutic activities;Therapeutic exercise;Balance training;Patient/family education;Passive range of motion;Manual techniques   PT Next Visit Plan work on right UE strengthening and balance as well as LE strengthening and manual techniques for decreased pain.    Consulted and Agree with Plan of Care Patient        Problem List Patient Active Problem List   Diagnosis Date Noted  . Pain in the chest 03/21/2015  . Essential hypertension 03/21/2015    Khylee Algeo, PT, MPT, GCS 08/14/2015, 9:31 AM  [facility] [address] [place], [address] Phone: [phone]   Fax:  [phone]

## 2015-08-18 ENCOUNTER — Ambulatory Visit: Payer: Medicare Other | Admitting: Physical Therapy

## 2015-08-18 DIAGNOSIS — M25552 Pain in left hip: Secondary | ICD-10-CM | POA: Diagnosis not present

## 2015-08-18 DIAGNOSIS — M25511 Pain in right shoulder: Secondary | ICD-10-CM | POA: Diagnosis not present

## 2015-08-18 DIAGNOSIS — R262 Difficulty in walking, not elsewhere classified: Secondary | ICD-10-CM

## 2015-08-18 DIAGNOSIS — R202 Paresthesia of skin: Secondary | ICD-10-CM | POA: Diagnosis not present

## 2015-08-18 DIAGNOSIS — M25512 Pain in left shoulder: Secondary | ICD-10-CM | POA: Diagnosis not present

## 2015-08-18 DIAGNOSIS — R200 Anesthesia of skin: Secondary | ICD-10-CM

## 2015-08-18 DIAGNOSIS — M25551 Pain in right hip: Secondary | ICD-10-CM | POA: Diagnosis not present

## 2015-08-18 NOTE — Therapy (Signed)
[facility] [address] [place], [address] Phone: [phone]   Fax:  [phone]  Physical Therapy Treatment  Patient Details  Name: Samantha Franco MRN: [id number] Date of Birth: 14-Jul-1929 Referring Provider:  Christie Nottingham, [place]  Encounter Date: 08/18/2015      PT End of Session - 08/18/15 0940    Visit Number 6   Number of Visits 13   Date for PT Re-Evaluation 09/11/15   Authorization Type 6   Authorization Time Period 10   PT Start Time 0855   PT Stop Time 0940   PT Time Calculation (min) 45 min   Activity Tolerance Patient tolerated treatment well;No increased pain   Behavior During Therapy [facility] for tasks assessed/performed      Past Medical History  Diagnosis Date  . Environmental allergies   . Arthritis   . COPD (chronic obstructive pulmonary disease)   . Diabetes mellitus without complication   . GERD (gastroesophageal reflux disease)   . Hyperlipidemia   . Hypertension   . Hypothyroidism   . Lung cancer   . Hx of rheumatic fever   . Chorea   . Kidney disease   . Mini stroke   . Vertigo   . Hx: UTI (urinary tract infection)   . Ear infection   . Stroke     x's 2  . Hyperlipidemia     Past Surgical History  Procedure Laterality Date  . Lung lobectomy    . Thyroid surgery    . Cataract extraction    . Cardiac catheterization      [facility]    There were no vitals filed for this visit.  Visit Diagnosis:  Numbness and tingling in left hand  Difficulty walking      Subjective Assessment - 08/18/15 0938    Subjective Patient reports she has general aches and pains today, but reports hips are not hurting in particular. Patient reports left hand tingling.    Limitations Sitting;Walking   Diagnostic tests History of 2 "small strokes"    Currently in Pain? Yes   Pain Score 2    Pain Descriptors / Indicators Aching   Pain Type Chronic pain   Pain Onset More than a month ago    Pain Frequency Constant   Multiple Pain Sites No         Treatment to include: Nu step level 3 x 6 min Standing exercises to include: hip abduction, hip extension, x 20 reps bilaterally. Patient has some hip discomfort with abd and extension therefore cued patient to stay within comfortable range.  Seated exercises: Red TB hip abd x 20,  Sidestepping, wedding march gait(lateral stepping) x 2 laps x 20 feet. Close supervision to cga needed for decreased balance.  Right hip joint distraction, posterior joint mob, lateral and medial joint mobs x 5 reps each. Pt reports relief with distraction.  Standing lumbar rotations x 15 min. With red TB  SHoulder retraction with red band, shoulder extension with red band x 15 reps of each with cues for correct form.  Manual cervical traction x 3 reps with 20 sec hold for left hand tingling. Symptoms resolved.  R shoulder AP joint mobs and distal joint mobs with distraction X 3 bouts x 30 sec. Patient reports tenderness with this.   Patient reports improvement in pain symptoms after therapy.          PT Education - 08/18/15 0940    Education  provided Yes   Education Details proper form with exercises, cervical traction for hand tingling   Person(s) Educated Patient   Methods Explanation;Demonstration   Comprehension Verbalized understanding;Returned demonstration             PT Long Term Goals - 07/31/15 1123    PT LONG TERM GOAL #1   Title Patient will be independent with HEP for improved mobility and decreased pain with daily activities.    Baseline 6/10 pain   Time 4   Period Weeks   Status New   PT LONG TERM GOAL #2   Title Patient will report < 4/10 pain on pain scale for improved functional mobility and independence.    Baseline 6/10 pain   Time 6   Period Weeks   Status New   PT LONG TERM GOAL #3   Title Patient will improve 5xSTS to < 20 seconds for eas of getting up and down.   Baseline 32 sec   Time 6    Period Weeks   Status New   PT LONG TERM GOAL #4   Title Patient will improve TUG to < 12 seconds for decreased fall risk.   Baseline 17 sec   Time 6   Period Weeks   Status New               Plan - 08/18/15 0941    Clinical Impression Statement Patient initially had left hand tingling this session, after traction patient reports no tingling in her hand.  Decreased hip pain this session.   Pt will benefit from skilled therapeutic intervention in order to improve on the following deficits Abnormal gait;Decreased activity tolerance;Decreased strength;Pain;Difficulty walking;Decreased range of motion   Rehab Potential Good   PT Frequency 2x / week   PT Duration 6 weeks   PT Treatment/Interventions Gait training;Therapeutic activities;Therapeutic exercise;Balance training;Patient/family education;Passive range of motion;Manual techniques   PT Next Visit Plan work on right UE strengthening and balance as well as LE strengthening and manual techniques for decreased pain.    PT Home Exercise Plan See patient instructions.    Consulted and Agree with Plan of Care Patient        Problem List Patient Active Problem List   Diagnosis Date Noted  . Pain in the chest 03/21/2015  . Essential hypertension 03/21/2015    Delois Silvester, PT, MPT, GCS 08/18/2015, 9:44 AM  [facility] [address] [place], [address] Phone: [phone]   Fax:  [phone]

## 2015-08-21 ENCOUNTER — Ambulatory Visit: Payer: Medicare Other | Admitting: Physical Therapy

## 2015-08-21 DIAGNOSIS — R202 Paresthesia of skin: Secondary | ICD-10-CM | POA: Diagnosis not present

## 2015-08-21 DIAGNOSIS — M25511 Pain in right shoulder: Secondary | ICD-10-CM | POA: Diagnosis not present

## 2015-08-21 DIAGNOSIS — R262 Difficulty in walking, not elsewhere classified: Secondary | ICD-10-CM | POA: Diagnosis not present

## 2015-08-21 DIAGNOSIS — M25551 Pain in right hip: Secondary | ICD-10-CM | POA: Diagnosis not present

## 2015-08-21 DIAGNOSIS — M25512 Pain in left shoulder: Secondary | ICD-10-CM | POA: Diagnosis not present

## 2015-08-21 DIAGNOSIS — M25552 Pain in left hip: Secondary | ICD-10-CM

## 2015-08-21 NOTE — Therapy (Signed)
[facility] [address] [place], [address] Phone: [phone]   Fax:  [phone]  Physical Therapy Treatment  Patient Details  Name: JOELEE Franco MRN: [id number] Date of Birth: 1929/08/15 Referring Provider:  Christie Franco, [place]  Encounter Date: 08/21/2015      PT End of Session - 08/21/15 1149    Visit Number 7   Number of Visits 13   Date for PT Re-Evaluation 09/11/15   Authorization Type 7   Authorization Time Period 10      Past Medical History  Diagnosis Date  . Environmental allergies   . Arthritis   . COPD (chronic obstructive pulmonary disease)   . Diabetes mellitus without complication   . GERD (gastroesophageal reflux disease)   . Hyperlipidemia   . Hypertension   . Hypothyroidism   . Lung cancer   . Hx of rheumatic fever   . Chorea   . Kidney disease   . Mini stroke   . Vertigo   . Hx: UTI (urinary tract infection)   . Ear infection   . Stroke     x's 2  . Hyperlipidemia     Past Surgical History  Procedure Laterality Date  . Lung lobectomy    . Thyroid surgery    . Cataract extraction    . Cardiac catheterization      [facility]    There were no vitals filed for this visit.  Visit Diagnosis:  Difficulty walking  Pain in joint, pelvic region and thigh, left      Subjective Assessment - 08/21/15 1147    Subjective Patient reports she is feeling good. No particular soreness, just general aches.   Limitations Sitting;Walking   Diagnostic tests History of 2 "small strokes"    Currently in Pain? Yes   Pain Score 2    Pain Onset More than a month ago   Pain Frequency Constant   Multiple Pain Sites No           Treatment to include: Nu step level 3 x 6 min Standing exercises to include: hip abduction, hip extension, x 20 reps bilaterally. Patient has some hip discomfort with abd and extension therefore cued patient to stay within comfortable range.  Seated  exercises: Red TB hip abd x 20,  Sidestepping, wedding march gait(lateral stepping) x 2 laps x 20 feet. Close supervision to cga needed for decreased balance.  Right hip joint distraction, posterior joint mob, lateral and medial joint mobs x 5 reps each. Pt reports relief with distraction.  Standing lumbar rotations x 15 min. With red TB  SHoulder retraction with red band, shoulder extension with red band x 15 reps of each with cues for correct form.    R shoulder AP joint mobs and distal joint mobs with distraction X 3 bouts x 30 sec. Patient reports tenderness with this.   Patient reports improvement in pain symptoms after therapy            PT Education - 08/21/15 1149    Education provided Yes   Education Details proper form with exercises   Person(s) Educated Patient   Methods Explanation;Demonstration   Comprehension Verbalized understanding;Returned demonstration             PT Long Term Goals - 07/31/15 1123    PT LONG TERM GOAL #1   Title Patient will be independent with HEP for improved mobility and decreased pain with daily activities.  Baseline 6/10 pain   Time 4   Period Weeks   Status New   PT LONG TERM GOAL #2   Title Patient will report < 4/10 pain on pain scale for improved functional mobility and independence.    Baseline 6/10 pain   Time 6   Period Weeks   Status New   PT LONG TERM GOAL #3   Title Patient will improve 5xSTS to < 20 seconds for eas of getting up and down.   Baseline 32 sec   Time 6   Period Weeks   Status New   PT LONG TERM GOAL #4   Title Patient will improve TUG to < 12 seconds for decreased fall risk.   Baseline 17 sec   Time 6   Period Weeks   Status New               Plan - 08/21/15 1150    Clinical Impression Statement Patient has general arthritic pain, She reports relief with joint mobility of hips.    Pt will benefit from skilled therapeutic intervention in order to improve on the following  deficits Abnormal gait;Decreased activity tolerance;Decreased strength;Pain;Difficulty walking;Decreased range of motion   Rehab Potential Good   PT Frequency 2x / week   PT Duration 6 weeks   PT Treatment/Interventions Gait training;Therapeutic activities;Therapeutic exercise;Balance training;Patient/family education;Passive range of motion;Manual techniques   PT Next Visit Plan work on right UE strengthening and balance as well as LE strengthening and manual techniques for decreased pain.    PT Home Exercise Plan See patient instructions.    Consulted and Agree with Plan of Care Patient        Problem List Patient Active Problem List   Diagnosis Date Noted  . Pain in the chest 03/21/2015  . Essential hypertension 03/21/2015    Richele Strand, PT, MPT, GCS 08/21/2015, 11:52 AM  [facility] [address] [place], [address] Phone: [phone]   Fax:  [phone]

## 2015-08-25 ENCOUNTER — Encounter: Payer: Medicare Other | Admitting: Physical Therapy

## 2015-08-28 ENCOUNTER — Encounter: Payer: Medicare Other | Admitting: Physical Therapy

## 2015-09-01 ENCOUNTER — Ambulatory Visit: Payer: Medicare Other | Admitting: Physical Therapy

## 2015-09-01 DIAGNOSIS — R262 Difficulty in walking, not elsewhere classified: Secondary | ICD-10-CM

## 2015-09-01 DIAGNOSIS — M25552 Pain in left hip: Secondary | ICD-10-CM | POA: Diagnosis not present

## 2015-09-01 DIAGNOSIS — M25551 Pain in right hip: Secondary | ICD-10-CM

## 2015-09-01 DIAGNOSIS — M25511 Pain in right shoulder: Secondary | ICD-10-CM | POA: Diagnosis not present

## 2015-09-01 DIAGNOSIS — R202 Paresthesia of skin: Secondary | ICD-10-CM | POA: Diagnosis not present

## 2015-09-01 DIAGNOSIS — M25512 Pain in left shoulder: Secondary | ICD-10-CM | POA: Diagnosis not present

## 2015-09-01 NOTE — Therapy (Signed)
[facility] [address], [place], [address] Phone: [phone]   Fax:  [phone]  Physical Therapy Treatment  Patient Details  Name: Samantha Franco MRN: [id number] Date of Birth: [birth year] No Data Recorded  Encounter Date: 09/01/2015      PT End of Session - 09/01/15 1139    Visit Number 8   Number of Visits 13   Date for PT Re-Evaluation 09/11/15   Authorization Type 8   Authorization Time Period 10   PT Start Time 1100   PT Stop Time 1138   PT Time Calculation (min) 38 min   Activity Tolerance Patient tolerated treatment well;No increased pain;Patient limited by pain   Behavior During Therapy [facility] for tasks assessed/performed      Past Medical History  Diagnosis Date  . Environmental allergies   . Arthritis   . COPD (chronic obstructive pulmonary disease)   . Diabetes mellitus without complication   . GERD (gastroesophageal reflux disease)   . Hyperlipidemia   . Hypertension   . Hypothyroidism   . Lung cancer   . Hx of rheumatic fever   . Chorea   . Kidney disease   . Mini stroke   . Vertigo   . Hx: UTI (urinary tract infection)   . Ear infection   . Stroke     x's 2  . Hyperlipidemia     Past Surgical History  Procedure Laterality Date  . Lung lobectomy    . Thyroid surgery    . Cataract extraction    . Cardiac catheterization      [facility]    There were no vitals filed for this visit.  Visit Diagnosis:  Difficulty walking  Right hip pain      Subjective Assessment - 09/01/15 1136    Subjective Patient reports he hips are hurting today and her shoulders.    Limitations Walking;Standing   Diagnostic tests History of 2 "small strokes"    Currently in Pain? Yes   Pain Location Hip   Pain Orientation Right;Left   Pain Descriptors / Indicators Aching   Pain Type Chronic pain   Pain Onset More than a month ago   Pain Frequency Intermittent        Treatment to  include: Nu step level 3 x 6 min Standing exercises to include: hip abduction, hip extension, x 20 reps bilaterally. Patient has some hip discomfort with abd and extension therefore cued patient to stay within comfortable range.    Sidestepping, wedding march gait(lateral stepping) x 2 laps x 20 feet. Close supervision to cga needed for decreased balance.  Right hip joint distraction, posterior joint mob, lateral and medial joint mobs x 5 reps each. Pt reports relief with distraction.  Standing lumbar rotations x 15 min. With red TB  SHoulder retraction with red band, shoulder extension with red band x 15 reps of each with cues for correct form.   R shoulder AP joint mobs and distal joint mobs with distraction X 3 bouts x 30 sec. Patient reports tenderness with this.   Patient reports improvement in pain symptoms after therapy         PT Education - 09/01/15 1138    Education provided Yes   Education Details proper form with exercises   Person(s) Educated Patient   Methods Explanation;Demonstration   Comprehension Verbalized understanding;Returned demonstration             PT Long Term Goals - 07/31/15  [place] #1   Title Patient will be independent with HEP for improved mobility and decreased pain with daily activities.    Baseline 6/10 pain   Time 4   Period Weeks   Status New   PT LONG TERM GOAL #2   Title Patient will report < 4/10 pain on pain scale for improved functional mobility and independence.    Baseline 6/10 pain   Time 6   Period Weeks   Status New   PT LONG TERM GOAL #3   Title Patient will improve 5xSTS to < 20 seconds for eas of getting up and down.   Baseline 32 sec   Time 6   Period Weeks   Status New   PT LONG TERM GOAL #4   Title Patient will improve TUG to < 12 seconds for decreased fall risk.   Baseline 17 sec   Time 6   Period Weeks   Status New               Plan - 09/01/15 1139    Clinical  Impression Statement Patient reports general arthritic pain, patient reports relief with therapy.    Pt will benefit from skilled therapeutic intervention in order to improve on the following deficits Abnormal gait;Decreased activity tolerance;Decreased strength;Pain;Difficulty walking;Decreased range of motion   Rehab Potential Good   PT Frequency 2x / week   PT Duration 6 weeks   PT Treatment/Interventions Gait training;Therapeutic activities;Therapeutic exercise;Balance training;Patient/family education;Passive range of motion;Manual techniques   PT Next Visit Plan work on right UE strengthening and balance as well as LE strengthening and manual techniques for decreased pain.    Consulted and Agree with Plan of Care Patient        Problem List Patient Active Problem List   Diagnosis Date Noted  . Pain in the chest 03/21/2015  . Essential hypertension 03/21/2015    Dore Oquin, PT, MPT, GCS 09/01/2015, 11:45 AM  [facility] [address] [place], [address] Phone: [phone]   Fax:  [phone]  Name: BARB SHEAR MRN: [id number] Date of Birth: [birth year]

## 2015-09-04 ENCOUNTER — Ambulatory Visit: Payer: Medicare Other | Admitting: Physical Therapy

## 2015-09-04 DIAGNOSIS — M25551 Pain in right hip: Secondary | ICD-10-CM | POA: Diagnosis not present

## 2015-09-04 DIAGNOSIS — M25511 Pain in right shoulder: Secondary | ICD-10-CM

## 2015-09-04 DIAGNOSIS — R262 Difficulty in walking, not elsewhere classified: Secondary | ICD-10-CM | POA: Diagnosis not present

## 2015-09-04 DIAGNOSIS — M25512 Pain in left shoulder: Secondary | ICD-10-CM

## 2015-09-04 DIAGNOSIS — M25552 Pain in left hip: Secondary | ICD-10-CM | POA: Diagnosis not present

## 2015-09-04 DIAGNOSIS — R202 Paresthesia of skin: Secondary | ICD-10-CM | POA: Diagnosis not present

## 2015-09-04 NOTE — Therapy (Signed)
[facility] [address], [place], [address] Phone: [phone]   Fax:  [phone]  Physical Therapy Treatment  Patient Details  Name: Samantha Franco MRN: [id number] Date of Birth: 1929/04/03 No Data Recorded  Encounter Date: 09/04/2015      PT End of Session - 09/04/15 1154    Visit Number 9   Number of Visits 13   Date for PT Re-Evaluation 09/11/15   Authorization Type 9   Authorization Time Period 10   PT Start Time 0855   PT Stop Time 0930   PT Time Calculation (min) 35 min   Activity Tolerance Patient tolerated treatment well;No increased pain   Behavior During Therapy [facility] for tasks assessed/performed      Past Medical History  Diagnosis Date  . Environmental allergies   . Arthritis   . COPD (chronic obstructive pulmonary disease)   . Diabetes mellitus without complication   . GERD (gastroesophageal reflux disease)   . Hyperlipidemia   . Hypertension   . Hypothyroidism   . Lung cancer   . Hx of rheumatic fever   . Chorea   . Kidney disease   . Mini stroke   . Vertigo   . Hx: UTI (urinary tract infection)   . Ear infection   . Stroke     x's 2  . Hyperlipidemia     Past Surgical History  Procedure Laterality Date  . Lung lobectomy    . Thyroid surgery    . Cataract extraction    . Cardiac catheterization      [facility]    There were no vitals filed for this visit.  Visit Diagnosis:  Difficulty walking  Pain of both shoulder joints      Subjective Assessment - 09/04/15 0925    Subjective Patient reports he hips are hurting today and her shoulders. She says she had a rough weekend with pain.    Limitations Walking;Standing   Diagnostic tests History of 2 "small strokes"    Currently in Pain? Yes   Pain Score 6    Pain Location Shoulder   Pain Orientation Right;Left   Pain Descriptors / Indicators Aching   Pain Type Chronic pain   Pain Onset More than a month ago   Pain Frequency Intermittent   Multiple Pain Sites Yes   Pain Score 6   Pain Location Groin   Pain Orientation Right;Left   Pain Descriptors / Indicators Aching;Sore   Pain Type Chronic pain   Pain Onset More than a month ago   Pain Frequency Intermittent         Treatment to include:  Nu step level 3 x 6 min  Side stepping on blue foam x 10 laps of 6'. HHA as needed for balance.  STS 2x10 Leg press machine 35# 2x10 Hip extension machine 40# x20 bilaterally assist needed for set up and instruction Hip abduction machine 25# x20 bilaterally. UE pulley for range of motion and bilateral shoulder pain x 4 min in flexion and abduction.           PT Education - 09/04/15 1154    Education provided Yes   Education Details proper form and set up with exercises.    Person(s) Educated Patient   Methods Explanation;Demonstration   Comprehension Verbalized understanding;Returned demonstration             PT Long Term Goals - 07/31/15 1123    PT LONG TERM GOAL #1  Title Patient will be independent with HEP for improved mobility and decreased pain with daily activities.    Baseline 6/10 pain   Time 4   Period Weeks   Status New   PT LONG TERM GOAL #2   Title Patient will report < 4/10 pain on pain scale for improved functional mobility and independence.    Baseline 6/10 pain   Time 6   Period Weeks   Status New   PT LONG TERM GOAL #3   Title Patient will improve 5xSTS to < 20 seconds for eas of getting up and down.   Baseline 32 sec   Time 6   Period Weeks   Status New   PT LONG TERM GOAL #4   Title Patient will improve TUG to < 12 seconds for decreased fall risk.   Baseline 17 sec   Time 6   Period Weeks   Status New               Plan - 09/04/15 1155    Clinical Impression Statement Patient reports shoulder and back pain this morning.    Pt will benefit from skilled therapeutic intervention in order to improve on the following deficits Abnormal  gait;Decreased activity tolerance;Decreased strength;Pain;Difficulty walking;Decreased range of motion   Rehab Potential Good   PT Frequency 2x / week   PT Duration 6 weeks   PT Treatment/Interventions Gait training;Therapeutic activities;Therapeutic exercise;Balance training;Patient/family education;Passive range of motion;Manual techniques   PT Next Visit Plan work on right UE strengthening and balance as well as LE strengthening and manual techniques for decreased pain.    Consulted and Agree with Plan of Care Patient        Problem List Patient Active Problem List   Diagnosis Date Noted  . Pain in the chest 03/21/2015  . Essential hypertension 03/21/2015    Dvaughn Fickle, PT, MPT, GCS 09/04/2015, 12:00 PM  [facility] [address] [place], [address] Phone: [phone]   Fax:  [phone]  Name: Samantha Franco MRN: [id number] Date of Birth: [birth year]

## 2015-09-06 DIAGNOSIS — M109 Gout, unspecified: Secondary | ICD-10-CM | POA: Diagnosis not present

## 2015-09-08 ENCOUNTER — Ambulatory Visit: Payer: Medicare Other | Admitting: Physical Therapy

## 2015-09-08 DIAGNOSIS — M25511 Pain in right shoulder: Secondary | ICD-10-CM

## 2015-09-08 DIAGNOSIS — M25551 Pain in right hip: Secondary | ICD-10-CM

## 2015-09-08 DIAGNOSIS — M25552 Pain in left hip: Secondary | ICD-10-CM | POA: Diagnosis not present

## 2015-09-08 DIAGNOSIS — M25512 Pain in left shoulder: Secondary | ICD-10-CM | POA: Diagnosis not present

## 2015-09-08 DIAGNOSIS — R262 Difficulty in walking, not elsewhere classified: Secondary | ICD-10-CM | POA: Diagnosis not present

## 2015-09-08 DIAGNOSIS — R202 Paresthesia of skin: Secondary | ICD-10-CM | POA: Diagnosis not present

## 2015-09-08 NOTE — Therapy (Signed)
[facility] [address], [place], [address] Phone: [phone]   Fax:  [phone]  Physical Therapy Treatment  Patient Details  Name: Samantha Franco MRN: [id number] Date of Birth: May 12, 1929 No Data Recorded  Encounter Date: 09/08/2015      PT End of Session - 09/08/15 1151    Visit Number 10   Number of Visits 13   Date for PT Re-Evaluation 09/11/15   Authorization Type 10   Authorization Time Period 10   PT Start Time 1100   PT Stop Time 1145   PT Time Calculation (min) 45 min   Activity Tolerance Patient tolerated treatment well;No increased pain   Behavior During Therapy [facility] for tasks assessed/performed      Past Medical History  Diagnosis Date  . Environmental allergies   . Arthritis   . COPD (chronic obstructive pulmonary disease)   . Diabetes mellitus without complication   . GERD (gastroesophageal reflux disease)   . Hyperlipidemia   . Hypertension   . Hypothyroidism   . Lung cancer   . Hx of rheumatic fever   . Chorea   . Kidney disease   . Mini stroke   . Vertigo   . Hx: UTI (urinary tract infection)   . Ear infection   . Stroke     x's 2  . Hyperlipidemia     Past Surgical History  Procedure Laterality Date  . Lung lobectomy    . Thyroid surgery    . Cataract extraction    . Cardiac catheterization      [facility]    There were no vitals filed for this visit.  Visit Diagnosis:  Right hip pain  Pain of both shoulder joints      Subjective Assessment - 09/08/15 1148    Subjective Patient reports groin pain in the right, states she has had a rough week as far as pain goes. Patient will see Arthritis MD on Monday.    Limitations Walking   Diagnostic tests History of 2 "small strokes"    Currently in Pain? Yes   Pain Score 5    Pain Location Shoulder   Pain Orientation Left;Right   Pain Descriptors / Indicators Aching   Pain Type Chronic pain   Pain Onset More  than a month ago   Pain Frequency Several days a week   Multiple Pain Sites Yes   Pain Score 5   Pain Location Groin   Pain Orientation Right   Pain Descriptors / Indicators Aching;Sore   Pain Type Chronic pain   Pain Onset More than a month ago   Pain Frequency Several days a week         Treatment to include:  Nu step level 3 x 7 min for warm up and joint mobility, decreased pain.   STS 1x10 without use of UEs.  Leg press machine 45# 2x10 for leg strengthening, cues and assist to perform eccentric movements slowly and controlled.  Hip extension machine 40# x20 bilaterally assist needed for set up and instruction Hip abduction machine 25# x15 bilaterally. Reduced reps due to pain this session.    Manual therapy to include bilateral hip joint mobilization in all planes (distraction, lateral, anterior and distal. ) Grade II 3 bouts x 30 sec each.  Manual joint mobilizations to left shoulder for pain relief to include distraction, PA/AP. Grade II x 3 bouts of 30 sec.  Patient reports decreased pain after manual therapy.  PT Education - 2015-09-14 1151    Education provided Yes   Education Details Proper form and set up for exercises. To move slowly with weighted exercises.    Person(s) Educated Patient   Methods Explanation;Demonstration   Comprehension Verbalized understanding;Returned demonstration;Verbal cues required             PT Long Term Goals - 07/31/15 1123    PT LONG TERM GOAL #1   Title Patient will be independent with HEP for improved mobility and decreased pain with daily activities.    Baseline 6/10 pain   Time 4   Period Weeks   Status New   PT LONG TERM GOAL #2   Title Patient will report < 4/10 pain on pain scale for improved functional mobility and independence.    Baseline 6/10 pain   Time 6   Period Weeks   Status New   PT LONG TERM GOAL #3   Title Patient will improve 5xSTS to < 20 seconds for eas of getting up and down.    Baseline 32 sec   Time 6   Period Weeks   Status New   PT LONG TERM GOAL #4   Title Patient will improve TUG to < 12 seconds for decreased fall risk.   Baseline 17 sec   Time 6   Period Weeks   Status New               Plan - 09/14/15 1152    Clinical Impression Statement Patient reports feeling better after session. Managing arthritis symptoms.    Pt will benefit from skilled therapeutic intervention in order to improve on the following deficits Abnormal gait;Decreased activity tolerance;Decreased strength;Pain;Difficulty walking;Decreased range of motion   Rehab Potential Good   PT Frequency 2x / week   PT Duration 6 weeks   PT Treatment/Interventions Gait training;Therapeutic activities;Therapeutic exercise;Balance training;Patient/family education;Passive range of motion;Manual techniques   Consulted and Agree with Plan of Care Patient          G-Codes - 09-14-2015 1153    Functional Assessment Tool Used clincal judgement   Functional Limitation Mobility: Walking and moving around   Mobility: Walking and Moving Around Current Status [phone]) At least 1 percent but less than 20 percent impaired, limited or restricted   Mobility: Walking and Moving Around Goal Status ([id number]) At least 1 percent but less than 20 percent impaired, limited or restricted      Problem List Patient Active Problem List   Diagnosis Date Noted  . Pain in the chest 03/21/2015  . Essential hypertension 03/21/2015    Mayah Urquidi, PT, MPT, GCS 09-14-2015, 11:55 AM  [facility] [address] [place], [address] Phone: [phone]   Fax:  [phone]  Name: Samantha Franco MRN: [id number] Date of Birth: [birth year]

## 2015-09-11 ENCOUNTER — Ambulatory Visit: Payer: Medicare Other | Admitting: Physical Therapy

## 2015-09-11 DIAGNOSIS — M542 Cervicalgia: Secondary | ICD-10-CM | POA: Diagnosis not present

## 2015-09-11 DIAGNOSIS — M47812 Spondylosis without myelopathy or radiculopathy, cervical region: Secondary | ICD-10-CM | POA: Diagnosis not present

## 2015-09-11 DIAGNOSIS — M25512 Pain in left shoulder: Secondary | ICD-10-CM | POA: Diagnosis not present

## 2015-09-11 DIAGNOSIS — M150 Primary generalized (osteo)arthritis: Secondary | ICD-10-CM | POA: Diagnosis not present

## 2015-09-11 DIAGNOSIS — G8929 Other chronic pain: Secondary | ICD-10-CM | POA: Diagnosis not present

## 2015-09-15 ENCOUNTER — Ambulatory Visit: Payer: Medicare Other | Admitting: Physical Therapy

## 2015-09-18 ENCOUNTER — Ambulatory Visit: Payer: Medicare Other | Admitting: Physical Therapy

## 2015-09-21 ENCOUNTER — Ambulatory Visit: Payer: Medicare Other | Attending: Physician Assistant | Admitting: Physical Therapy

## 2015-09-21 DIAGNOSIS — M25551 Pain in right hip: Secondary | ICD-10-CM | POA: Insufficient documentation

## 2015-09-21 DIAGNOSIS — R262 Difficulty in walking, not elsewhere classified: Secondary | ICD-10-CM | POA: Diagnosis not present

## 2015-09-21 NOTE — Therapy (Signed)
[facility] [address], [place], [address] Phone: [phone]   Fax:  [phone]  Physical Therapy Treatment  Patient Details  Name: Samantha Franco MRN: [id number] Date of Birth: 04-05-1929 No Data Recorded  Encounter Date: 09/21/2015      PT End of Session - 09/21/15 1658    Visit Number 1   Number of Visits 9   Date for PT Re-Evaluation 10/19/15   Authorization Type 10   Authorization Time Period 10   PT Start Time 1415   PT Stop Time 1435   PT Time Calculation (min) 20 min   Activity Tolerance Patient tolerated treatment well;No increased pain   Behavior During Therapy [facility] for tasks assessed/performed      Past Medical History  Diagnosis Date  . Environmental allergies   . Arthritis   . COPD (chronic obstructive pulmonary disease)   . Diabetes mellitus without complication   . GERD (gastroesophageal reflux disease)   . Hyperlipidemia   . Hypertension   . Hypothyroidism   . Lung cancer   . Hx of rheumatic fever   . Chorea   . Kidney disease   . Mini stroke   . Vertigo   . Hx: UTI (urinary tract infection)   . Ear infection   . Stroke     x's 2  . Hyperlipidemia     Past Surgical History  Procedure Laterality Date  . Lung lobectomy    . Thyroid surgery    . Cataract extraction    . Cardiac catheterization      [facility]    There were no vitals filed for this visit.  Visit Diagnosis:  Difficulty walking - Plan: PT plan of care cert/re-cert      Subjective Assessment - 09/21/15 1656    Subjective Patient got schedule messed up today and arrived for a 15-20 min session. Reports she was in [facility] this week and has been feeling really good. She has no pain.    Diagnostic tests History of 2 "small strokes"    Currently in Pain? No/denies         Treatment to include:  Nu step level 3 x 3 min for warm up and joint mobility. Side stepping on blue foam x 7 laps without hha as  able.  Narrow stance on blue foam x 1 min.    STS 2x10 without use of UEs.  Leg press machine 45# 2x10 for leg strengthening, cues and assist to perform eccentric movements slowly and controlled.  Hip extension machine 40# x20 bilaterally assist needed for set up and instruction Hip abduction machine 25# x15 bilaterally. Reduced reps due to pain this session.            PT Education - 09/21/15 1657    Education provided Yes   Education Details proper form with exercises, new exercises and set up   Person(s) Educated Patient   Methods Explanation;Demonstration   Comprehension Verbalized understanding;Returned demonstration             PT Long Term Goals - 09/21/15 1700    PT LONG TERM GOAL #1   Title Patient will be independent with HEP for improved mobility and decreased pain with daily activities.    Baseline 6/10 pain   Time 4   Period Weeks   Status Achieved   PT LONG TERM GOAL #2   Title Patient will report < 4/10 pain on pain scale for improved functional  mobility and independence.    Baseline 6/10 pain, 0/10 pain this session   Time 6   Period Weeks   Status Achieved   PT LONG TERM GOAL #3   Title Patient will improve 5xSTS to < 20 seconds for eas of getting up and down.   Baseline 32 sec   Time 6   Period Weeks   Status Achieved   PT LONG TERM GOAL #4   Title Patient will improve TUG to < 12 seconds for decreased fall risk.   Baseline 17 sec   Time 6   Period Weeks   Status On-going               Plan - 03-Oct-2015 1659    Clinical Impression Statement Patient reports no pain this session. We focused more on strengthening this session.    Pt will benefit from skilled therapeutic intervention in order to improve on the following deficits Abnormal gait;Decreased activity tolerance;Decreased strength;Pain;Difficulty walking;Decreased range of motion   Rehab Potential Good   PT Frequency 2x / week   PT Duration 4 weeks   PT  Treatment/Interventions Gait training;Therapeutic activities;Therapeutic exercise;Balance training;Patient/family education;Passive range of motion;Manual techniques   PT Next Visit Plan work on right UE strengthening and balance as well as LE strengthening and manual techniques for decreased pain.    Consulted and Agree with Plan of Care Patient          G-Codes - Oct 03, 2015 1701    Functional Assessment Tool Used clincal judgement   Functional Limitation Mobility: Walking and moving around   Mobility: Walking and Moving Around Current Status [phone]) At least 1 percent but less than 20 percent impaired, limited or restricted   Mobility: Walking and Moving Around Goal Status [phone]) At least 1 percent but less than 20 percent impaired, limited or restricted      Problem List Patient Active Problem List   Diagnosis Date Noted  . Pain in the chest 03/21/2015  . Essential hypertension 03/21/2015    Samantha Franco, PT, MPT, GCS 2015/10/03, 5:06 PM  [facility] [address] [place], [address] Phone: [phone]   Fax:  [phone]  Name: Samantha Franco MRN: [id number] Date of Birth: Jul 21, 1929

## 2015-09-25 ENCOUNTER — Ambulatory Visit: Payer: Medicare Other | Admitting: Physical Therapy

## 2015-09-25 DIAGNOSIS — M25551 Pain in right hip: Secondary | ICD-10-CM

## 2015-09-25 DIAGNOSIS — R262 Difficulty in walking, not elsewhere classified: Secondary | ICD-10-CM

## 2015-09-25 NOTE — Therapy (Signed)
[facility] [address] [place], [address] Phone: [phone]   Fax:  [phone]  Physical Therapy Treatment  Patient Details  Name: Samantha Franco MRN: [id number] Date of Birth: 1928-12-10 No Data Recorded  Encounter Date: 09/25/2015      PT End of Session - 09/25/15 1419    Visit Number 2   Number of Visits 9   Date for PT Re-Evaluation 10/19/15   Authorization Type 2   Authorization Time Period 10   PT Start Time 1330   PT Stop Time 1415   PT Time Calculation (min) 45 min   Activity Tolerance Patient tolerated treatment well;No increased pain   Behavior During Therapy [facility] for tasks assessed/performed      Past Medical History  Diagnosis Date  . Environmental allergies   . Arthritis   . COPD (chronic obstructive pulmonary disease)   . Diabetes mellitus without complication   . GERD (gastroesophageal reflux disease)   . Hyperlipidemia   . Hypertension   . Hypothyroidism   . Lung cancer   . Hx of rheumatic fever   . Chorea   . Kidney disease   . Mini stroke   . Vertigo   . Hx: UTI (urinary tract infection)   . Ear infection   . Stroke     x's 2  . Hyperlipidemia     Past Surgical History  Procedure Laterality Date  . Lung lobectomy    . Thyroid surgery    . Cataract extraction    . Cardiac catheterization      [facility]    There were no vitals filed for this visit.  Visit Diagnosis:  Difficulty walking  Right hip pain      Subjective Assessment - 09/25/15 1416    Subjective Patient reports she started having some pain again yesterday, ,mainly in right hip.   Limitations House hold activities   Diagnostic tests History of 2 "small strokes"    Currently in Pain? Yes   Pain Location Hip   Pain Orientation Right   Pain Descriptors / Indicators Aching   Pain Type Chronic pain   Pain Onset More than a month ago   Pain Frequency Intermittent   Multiple Pain Sites No          Treatment to include:  Nu step level 3 x 6 min for warm up and joint mobility, decreased pain.   STS 2x10 without use of UEs.  Leg press machine 45# 2x10 for leg strengthening, cues and assist to perform eccentric movements slowly and controlled.  Hip extension machine 40# x20 bilaterally assist needed for set up and instruction Hip abduction machine 25# x20 bilaterally. Reduced reps due to pain this session.  Heel raises x 20 Sidestepping on blue foam without UE assist x 6 laps of 10'. Narrow stance on foam with head turning 2x1 min each  Manual therapy to include right hip joint mobilization in all planes (distraction, lateral, anterior and distal. ) Grade II 3 bouts x 30 sec each.    Patient reports decreased pain after manual therapy.          PT Education - 09/25/15 1419    Education provided Yes   Education Details set up and proper form with exercises.    Person(s) Educated Patient   Methods Explanation;Demonstration   Comprehension Verbalized understanding;Returned demonstration             PT Long Term Goals -  09/21/15 1700    PT LONG TERM GOAL #1   Title Patient will be independent with HEP for improved mobility and decreased pain with daily activities.    Baseline 6/10 pain   Time 4   Period Weeks   Status Achieved   PT LONG TERM GOAL #2   Title Patient will report < 4/10 pain on pain scale for improved functional mobility and independence.    Baseline 6/10 pain, 0/10 pain this session   Time 6   Period Weeks   Status Achieved   PT LONG TERM GOAL #3   Title Patient will improve 5xSTS to < 20 seconds for eas of getting up and down.   Baseline 32 sec   Time 6   Period Weeks   Status Achieved   PT LONG TERM GOAL #4   Title Patient will improve TUG to < 12 seconds for decreased fall risk.   Baseline 17 sec   Time 6   Period Weeks   Status On-going               Plan - 09/25/15 1420    Clinical Impression Statement Patient  reports some of her pain has come back. She reports right hip pain.    Pt will benefit from skilled therapeutic intervention in order to improve on the following deficits Abnormal gait;Decreased activity tolerance;Decreased strength;Pain;Difficulty walking;Decreased range of motion   Rehab Potential Good   PT Frequency 2x / week   PT Duration 4 weeks   PT Treatment/Interventions Gait training;Therapeutic activities;Therapeutic exercise;Balance training;Patient/family education;Passive range of motion;Manual techniques   PT Next Visit Plan work on right UE strengthening and balance as well as LE strengthening and manual techniques for decreased pain.    Consulted and Agree with Plan of Care Patient        Problem List Patient Active Problem List   Diagnosis Date Noted  . Pain in the chest 03/21/2015  . Essential hypertension 03/21/2015    Soul Deveney,PT, MPT, GCS 09/25/2015, 2:22 PM  [facility] [address] [place], [address] Phone: [phone]   Fax:  [phone]  Name: Samantha Franco MRN: [id number] Date of Birth: 03/16/1929

## 2015-09-29 ENCOUNTER — Ambulatory Visit: Payer: Medicare Other | Admitting: Physical Therapy

## 2015-09-29 DIAGNOSIS — R262 Difficulty in walking, not elsewhere classified: Secondary | ICD-10-CM

## 2015-09-29 DIAGNOSIS — M25551 Pain in right hip: Secondary | ICD-10-CM | POA: Diagnosis not present

## 2015-09-29 NOTE — Therapy (Signed)
[facility] [address], [place], [address] Phone: [phone]   Fax:  [phone]  Physical Therapy Treatment  Patient Details  Name: Samantha Franco MRN: [id number] Date of Birth: June 28, 1929 No Data Recorded  Encounter Date: 09/29/2015      PT End of Session - 09/29/15 1226    Visit Number 3   Number of Visits 9   Date for PT Re-Evaluation 10/19/15   Authorization Type 2   Authorization Time Period 10   PT Start Time 9024   PT Stop Time 1225   PT Time Calculation (min) 40 min   Activity Tolerance Patient tolerated treatment well;No increased pain   Behavior During Therapy [facility] for tasks assessed/performed      Past Medical History  Diagnosis Date  . Environmental allergies   . Arthritis   . COPD (chronic obstructive pulmonary disease)   . Diabetes mellitus without complication   . GERD (gastroesophageal reflux disease)   . Hyperlipidemia   . Hypertension   . Hypothyroidism   . Lung cancer   . Hx of rheumatic fever   . Chorea   . Kidney disease   . Mini stroke   . Vertigo   . Hx: UTI (urinary tract infection)   . Ear infection   . Stroke     x's 2  . Hyperlipidemia     Past Surgical History  Procedure Laterality Date  . Lung lobectomy    . Thyroid surgery    . Cataract extraction    . Cardiac catheterization      [facility]    There were no vitals filed for this visit.  Visit Diagnosis:  Difficulty walking  Right hip pain      Subjective Assessment - 09/29/15 1225    Subjective Patient reports her right hip is feeling better, but continues to have some pain in the groin.    Limitations House hold activities   Diagnostic tests History of 2 "small strokes"    Currently in Pain? Yes   Pain Location Hip   Pain Orientation Right   Pain Descriptors / Indicators Aching   Pain Type Acute pain   Pain Onset More than a month ago   Pain Frequency Intermittent   Multiple Pain Sites  No        Treatment to include:  Nu step level 3 x 6 min for warm up and joint mobility, decreased pain.   STS 1x10 without use of UEs and 3# in each hand.  Leg press machine 45# 2x10 for leg strengthening, cues and assist to perform eccentric movements slowly and controlled.  Hip extension machine 55# x20 bilaterally assist needed for set up and instruction Hip abduction machine 40# on left 25#on right  x20 bilaterally. Reduced reps due to pain this session.  Heel raises x 20 on blue foam Sidestepping on blue foam without UE assist x 6 laps of 10'. Wedding march gait 2x 70' Narrow stance on foam with head turning 2x1 min each, tandem stance x 30 sec each.           PT Education - 09/29/15 1226    Education provided Yes   Education Details new exercises and balance activites.   Person(s) Educated Patient   Methods Explanation;Demonstration   Comprehension Verbalized understanding;Returned demonstration             PT Long Term Goals - 09/21/15 1700    PT LONG TERM GOAL #1  Title Patient will be independent with HEP for improved mobility and decreased pain with daily activities.    Baseline 6/10 pain   Time 4   Period Weeks   Status Achieved   PT LONG TERM GOAL #2   Title Patient will report < 4/10 pain on pain scale for improved functional mobility and independence.    Baseline 6/10 pain, 0/10 pain this session   Time 6   Period Weeks   Status Achieved   PT LONG TERM GOAL #3   Title Patient will improve 5xSTS to < 20 seconds for eas of getting up and down.   Baseline 32 sec   Time 6   Period Weeks   Status Achieved   PT LONG TERM GOAL #4   Title Patient will improve TUG to < 12 seconds for decreased fall risk.   Baseline 17 sec   Time 6   Period Weeks   Status On-going               Plan - 09/29/15 1227    Clinical Impression Statement Patient reports she has slight pain in right hip.    Pt will benefit from skilled therapeutic  intervention in order to improve on the following deficits Abnormal gait;Decreased activity tolerance;Decreased strength;Pain;Difficulty walking;Decreased range of motion   Rehab Potential Good   PT Frequency 2x / week   PT Duration 4 weeks   PT Treatment/Interventions Gait training;Therapeutic activities;Therapeutic exercise;Balance training;Patient/family education;Passive range of motion;Manual techniques   PT Next Visit Plan work on right UE strengthening and balance as well as LE strengthening and manual techniques for decreased pain.    PT Home Exercise Plan See patient instructions.    Consulted and Agree with Plan of Care Patient        Problem List Patient Active Problem List   Diagnosis Date Noted  . Pain in the chest 03/21/2015  . Essential hypertension 03/21/2015    Taje Tondreau, PT, MPT, GCS 09/29/2015, 12:29 PM  [facility] [address] [place], [address] Phone: [phone]   Fax:  [phone]  Name: Samantha Franco MRN: [id number] Date of Birth: 02/26/1929

## 2015-10-02 ENCOUNTER — Ambulatory Visit: Payer: Medicare Other | Admitting: Physical Therapy

## 2015-10-02 DIAGNOSIS — R262 Difficulty in walking, not elsewhere classified: Secondary | ICD-10-CM | POA: Diagnosis not present

## 2015-10-02 DIAGNOSIS — M25551 Pain in right hip: Secondary | ICD-10-CM | POA: Diagnosis not present

## 2015-10-02 NOTE — Therapy (Signed)
[facility] [address] [place], [address] Phone: [phone]   Fax:  [phone]  Physical Therapy Treatment  Patient Details  Name: Samantha Franco MRN: [id number] Date of Birth: 06/22/1929 No Data Recorded  Encounter Date: 10/02/2015      PT End of Session - 10/02/15 1346    Visit Number 4   Number of Visits 9   Date for PT Re-Evaluation 10/19/15   Authorization Type 4   Authorization Time Period 10   PT Start Time 1300   PT Stop Time 1345   PT Time Calculation (min) 45 min   Activity Tolerance Patient tolerated treatment well;No increased pain   Behavior During Therapy [facility] for tasks assessed/performed      Past Medical History  Diagnosis Date  . Environmental allergies   . Arthritis   . COPD (chronic obstructive pulmonary disease)   . Diabetes mellitus without complication   . GERD (gastroesophageal reflux disease)   . Hyperlipidemia   . Hypertension   . Hypothyroidism   . Lung cancer   . Hx of rheumatic fever   . Chorea   . Kidney disease   . Mini stroke   . Vertigo   . Hx: UTI (urinary tract infection)   . Ear infection   . Stroke     x's 2  . Hyperlipidemia     Past Surgical History  Procedure Laterality Date  . Lung lobectomy    . Thyroid surgery    . Cataract extraction    . Cardiac catheterization      [facility]    There were no vitals filed for this visit.  Visit Diagnosis:  Difficulty walking      Subjective Assessment - 10/02/15 1345    Subjective Patient reports she had a nice weekend with her daughter. She reports her hip pain has improved.    Limitations House hold activities   Diagnostic tests History of 2 "small strokes"    Currently in Pain? No/denies   Pain Location Hip          Treatment to include:  Nu step level 3 x 6 min for warm up and joint mobility, decreased pain.   STS 2x10 without use of UEs and 3# in each hand.  Leg press machine  45# 2x10 for leg strengthening, cues and assist to perform eccentric movements slowly and controlled.  Hip extension machine 55# x20 bilaterally assist needed for set up and instruction Hip abduction machine 40# on left 25#on right x20 bilaterally. Reduced reps due to pain this session.  Heel raises x 20 on blue foam Sidestepping on blue foam without UE assist x 6 laps of 10'. Wedding march gait 2x 35' with 3" weights in each hand, sba for safety. Marching on blue foam x 2 min with UE support as needed for balance. Sba for safety Narrow stance on foam with head turning 2x1 min each,                PT Education - 10/02/15 1346    Education provided Yes   Education Details proper form with exercises, safety   Person(s) Educated Patient   Methods Explanation;Demonstration   Comprehension Verbalized understanding;Returned demonstration             PT Long Term Goals - 09/21/15 1700    PT LONG TERM GOAL #1   Title Patient will be independent with HEP for improved mobility and decreased pain with  daily activities.    Baseline 6/10 pain   Time 4   Period Weeks   Status Achieved   PT LONG TERM GOAL #2   Title Patient will report < 4/10 pain on pain scale for improved functional mobility and independence.    Baseline 6/10 pain, 0/10 pain this session   Time 6   Period Weeks   Status Achieved   PT LONG TERM GOAL #3   Title Patient will improve 5xSTS to < 20 seconds for eas of getting up and down.   Baseline 32 sec   Time 6   Period Weeks   Status Achieved   PT LONG TERM GOAL #4   Title Patient will improve TUG to < 12 seconds for decreased fall risk.   Baseline 17 sec   Time 6   Period Weeks   Status On-going               Plan - 10/02/15 1347    Clinical Impression Statement Patient is making good progress with strengthening and balance activities. Reporting decreased pain.    Pt will benefit from skilled therapeutic intervention in order to improve on  the following deficits Abnormal gait;Decreased activity tolerance;Decreased strength;Pain;Difficulty walking;Decreased range of motion   Rehab Potential Good   PT Frequency 2x / week   PT Duration 4 weeks   PT Treatment/Interventions Gait training;Therapeutic activities;Therapeutic exercise;Balance training;Patient/family education;Passive range of motion;Manual techniques   PT Next Visit Plan work on right UE strengthening and balance as well as LE strengthening and manual techniques for decreased pain.    Consulted and Agree with Plan of Care Patient        Problem List Patient Active Problem List   Diagnosis Date Noted  . Pain in the chest 03/21/2015  . Essential hypertension 03/21/2015    Kassy Mcenroe, PT, MPT, GCS 10/02/2015, 1:49 PM  [facility] [address], [place], [address] Phone: [phone]   Fax:  [phone]  Name: KANANI MOWBRAY MRN: [id number] Date of Birth: 06-02-1929

## 2015-10-09 ENCOUNTER — Ambulatory Visit: Payer: Medicare Other | Admitting: Physical Therapy

## 2015-10-09 DIAGNOSIS — R262 Difficulty in walking, not elsewhere classified: Secondary | ICD-10-CM

## 2015-10-09 DIAGNOSIS — M25551 Pain in right hip: Secondary | ICD-10-CM | POA: Diagnosis not present

## 2015-10-09 NOTE — Therapy (Signed)
[facility] [address] [place], [address] Phone: [phone]   Fax:  [phone]  Physical Therapy Treatment  Patient Details  Name: JALEEYAH MUNCE MRN: [id number] Date of Birth: Nov 18, 1928 No Data Recorded  Encounter Date: 10/09/2015      PT End of Session - 10/09/15 1351    Visit Number 5   Number of Visits 9   Date for PT Re-Evaluation 10/19/15   Authorization Type 5   Authorization Time Period 10   PT Start Time 1300   PT Stop Time 1340   PT Time Calculation (min) 40 min   Activity Tolerance Patient tolerated treatment well;No increased pain   Behavior During Therapy [facility] for tasks assessed/performed      Past Medical History  Diagnosis Date  . Environmental allergies   . Arthritis   . COPD (chronic obstructive pulmonary disease)   . Diabetes mellitus without complication   . GERD (gastroesophageal reflux disease)   . Hyperlipidemia   . Hypertension   . Hypothyroidism   . Lung cancer   . Hx of rheumatic fever   . Chorea   . Kidney disease   . Mini stroke   . Vertigo   . Hx: UTI (urinary tract infection)   . Ear infection   . Stroke     x's 2  . Hyperlipidemia     Past Surgical History  Procedure Laterality Date  . Lung lobectomy    . Thyroid surgery    . Cataract extraction    . Cardiac catheterization      [facility]    There were no vitals filed for this visit.  Visit Diagnosis:  Difficulty walking      Subjective Assessment - 10/09/15 1347    Subjective Patient states she feels like this has really helped. She has no pain this date.    Diagnostic tests History of 2 "small strokes"    Currently in Pain? No/denies          Treatment to include:  Nu step level 3 x 6 min for warm up and joint mobility  STS 2x10 without use of UEs and 3# in each hand.  Leg press machine 45# 2x20 for leg strengthening, cues and assist to perform eccentric movements slowly and  controlled.  Hip extension machine 55# x20 bilaterally assist needed for set up and instruction Hip abduction machine 25# on left 25#on right x20 bilaterally.  Heel raises x 20 on blue foam, UEs as needed for balance. Sidestepping on blue foam without UE assist as able x 6 laps of 10'. Wedding march gait 2x 45' forward and backward 35' each, close supervision for safety. Narrow stance on foam with head turning 2x1 min each, tandem stance x 30 sec each.  Step ups on 8" step x 10 each side. UEs as needed for balance/assist.         PT Education - 10/09/15 1347    Education provided Yes   Education Details POC proper form and technique with exercises.    Person(s) Educated Patient   Methods Explanation;Demonstration;Verbal cues   Comprehension Verbalized understanding;Returned demonstration             PT Long Term Goals - 09/21/15 1700    PT LONG TERM GOAL #1   Title Patient will be independent with HEP for improved mobility and decreased pain with daily activities.    Baseline 6/10 pain   Time 4   Period  Weeks   Status Achieved   PT LONG TERM GOAL #2   Title Patient will report < 4/10 pain on pain scale for improved functional mobility and independence.    Baseline 6/10 pain, 0/10 pain this session   Time 6   Period Weeks   Status Achieved   PT LONG TERM GOAL #3   Title Patient will improve 5xSTS to < 20 seconds for eas of getting up and down.   Baseline 32 sec   Time 6   Period Weeks   Status Achieved   PT LONG TERM GOAL #4   Title Patient will improve TUG to < 12 seconds for decreased fall risk.   Baseline 17 sec   Time 6   Period Weeks   Status On-going               Plan - 10/09/15 1352    Clinical Impression Statement Patient has made good progress with therapy. Has had little to no pain recently. Reports that she is feeling stronger and feels like it has made a big difference.    Pt will benefit from skilled therapeutic intervention in order to  improve on the following deficits Abnormal gait;Decreased activity tolerance;Decreased strength;Pain;Difficulty walking;Decreased range of motion   Rehab Potential Good   PT Frequency 2x / week   PT Duration 4 weeks   PT Treatment/Interventions Gait training;Therapeutic activities;Therapeutic exercise;Balance training;Patient/family education;Passive range of motion;Manual techniques   PT Next Visit Plan work on right UE strengthening and balance as well as LE strengthening and manual techniques for decreased pain.    PT Home Exercise Plan See patient instructions.    Consulted and Agree with Plan of Care Patient        Problem List Patient Active Problem List   Diagnosis Date Noted  . Pain in the chest 03/21/2015  . Essential hypertension 03/21/2015    Barnett Elzey, PT, MPT, GCS 10/09/2015, 1:56 PM  [facility] [address] [place], [address] Phone: [phone]   Fax:  [phone]  Name: SHARUNDA SALMON MRN: [id number] Date of Birth: 1929-03-21

## 2015-10-13 ENCOUNTER — Ambulatory Visit: Payer: Medicare Other | Admitting: Physical Therapy

## 2015-10-16 ENCOUNTER — Encounter: Payer: Medicare Other | Admitting: Physical Therapy

## 2015-10-16 DIAGNOSIS — I10 Essential (primary) hypertension: Secondary | ICD-10-CM | POA: Diagnosis not present

## 2015-10-16 DIAGNOSIS — Z23 Encounter for immunization: Secondary | ICD-10-CM | POA: Diagnosis not present

## 2015-10-16 DIAGNOSIS — N390 Urinary tract infection, site not specified: Secondary | ICD-10-CM | POA: Diagnosis not present

## 2015-10-16 DIAGNOSIS — E1165 Type 2 diabetes mellitus with hyperglycemia: Secondary | ICD-10-CM | POA: Diagnosis not present

## 2015-10-16 DIAGNOSIS — E039 Hypothyroidism, unspecified: Secondary | ICD-10-CM | POA: Diagnosis not present

## 2015-10-16 DIAGNOSIS — G4700 Insomnia, unspecified: Secondary | ICD-10-CM | POA: Diagnosis not present

## 2015-10-19 ENCOUNTER — Ambulatory Visit: Payer: Medicare Other | Attending: Physician Assistant | Admitting: Physical Therapy

## 2015-10-27 ENCOUNTER — Ambulatory Visit: Payer: Medicare Other | Admitting: Physical Therapy

## 2015-11-11 DIAGNOSIS — Z87891 Personal history of nicotine dependence: Secondary | ICD-10-CM | POA: Diagnosis not present

## 2015-11-11 DIAGNOSIS — C3490 Malignant neoplasm of unspecified part of unspecified bronchus or lung: Secondary | ICD-10-CM | POA: Diagnosis not present

## 2015-11-11 DIAGNOSIS — E1122 Type 2 diabetes mellitus with diabetic chronic kidney disease: Secondary | ICD-10-CM | POA: Diagnosis not present

## 2015-11-11 DIAGNOSIS — E860 Dehydration: Secondary | ICD-10-CM | POA: Diagnosis not present

## 2015-11-11 DIAGNOSIS — I129 Hypertensive chronic kidney disease with stage 1 through stage 4 chronic kidney disease, or unspecified chronic kidney disease: Secondary | ICD-10-CM | POA: Diagnosis not present

## 2015-11-11 DIAGNOSIS — J9801 Acute bronchospasm: Secondary | ICD-10-CM | POA: Diagnosis not present

## 2015-11-11 DIAGNOSIS — E039 Hypothyroidism, unspecified: Secondary | ICD-10-CM | POA: Diagnosis present

## 2015-11-11 DIAGNOSIS — C3431 Malignant neoplasm of lower lobe, right bronchus or lung: Secondary | ICD-10-CM | POA: Diagnosis not present

## 2015-11-11 DIAGNOSIS — E876 Hypokalemia: Secondary | ICD-10-CM | POA: Diagnosis present

## 2015-11-11 DIAGNOSIS — J189 Pneumonia, unspecified organism: Secondary | ICD-10-CM | POA: Diagnosis not present

## 2015-11-11 DIAGNOSIS — E785 Hyperlipidemia, unspecified: Secondary | ICD-10-CM | POA: Diagnosis present

## 2015-11-11 DIAGNOSIS — R918 Other nonspecific abnormal finding of lung field: Secondary | ICD-10-CM | POA: Diagnosis not present

## 2015-11-11 DIAGNOSIS — M109 Gout, unspecified: Secondary | ICD-10-CM | POA: Diagnosis present

## 2015-11-11 DIAGNOSIS — R05 Cough: Secondary | ICD-10-CM | POA: Diagnosis not present

## 2015-11-11 DIAGNOSIS — Z8673 Personal history of transient ischemic attack (TIA), and cerebral infarction without residual deficits: Secondary | ICD-10-CM | POA: Diagnosis not present

## 2015-11-11 DIAGNOSIS — Z85118 Personal history of other malignant neoplasm of bronchus and lung: Secondary | ICD-10-CM | POA: Diagnosis not present

## 2015-11-11 DIAGNOSIS — R1084 Generalized abdominal pain: Secondary | ICD-10-CM | POA: Diagnosis not present

## 2015-11-11 DIAGNOSIS — F329 Major depressive disorder, single episode, unspecified: Secondary | ICD-10-CM | POA: Diagnosis present

## 2015-11-11 DIAGNOSIS — N179 Acute kidney failure, unspecified: Secondary | ICD-10-CM | POA: Diagnosis not present

## 2015-11-11 DIAGNOSIS — N182 Chronic kidney disease, stage 2 (mild): Secondary | ICD-10-CM | POA: Diagnosis present

## 2015-11-11 DIAGNOSIS — R509 Fever, unspecified: Secondary | ICD-10-CM | POA: Diagnosis not present

## 2015-11-21 ENCOUNTER — Ambulatory Visit
Admission: RE | Admit: 2015-11-21 | Discharge: 2015-11-21 | Disposition: A | Discharge status: Home / Self Care | Payer: Medicare Other | Source: Ambulatory Visit | Attending: Internal Medicine | Admitting: Internal Medicine

## 2015-11-21 ENCOUNTER — Other Ambulatory Visit: Payer: Self-pay | Admitting: Internal Medicine

## 2015-11-21 DIAGNOSIS — R0989 Other specified symptoms and signs involving the circulatory and respiratory systems: Secondary | ICD-10-CM | POA: Insufficient documentation

## 2015-11-21 DIAGNOSIS — I10 Essential (primary) hypertension: Secondary | ICD-10-CM | POA: Diagnosis not present

## 2015-11-21 DIAGNOSIS — J189 Pneumonia, unspecified organism: Secondary | ICD-10-CM | POA: Diagnosis not present

## 2015-11-21 DIAGNOSIS — I700 Atherosclerosis of aorta: Secondary | ICD-10-CM | POA: Insufficient documentation

## 2015-11-21 DIAGNOSIS — D72829 Elevated white blood cell count, unspecified: Secondary | ICD-10-CM | POA: Diagnosis not present

## 2015-11-21 DIAGNOSIS — R05 Cough: Secondary | ICD-10-CM | POA: Diagnosis not present

## 2015-11-21 DIAGNOSIS — R0602 Shortness of breath: Secondary | ICD-10-CM | POA: Diagnosis not present

## 2015-11-28 DIAGNOSIS — J449 Chronic obstructive pulmonary disease, unspecified: Secondary | ICD-10-CM | POA: Diagnosis not present

## 2015-12-14 DIAGNOSIS — Z23 Encounter for immunization: Secondary | ICD-10-CM | POA: Diagnosis not present

## 2016-01-06 DIAGNOSIS — R1110 Vomiting, unspecified: Secondary | ICD-10-CM | POA: Diagnosis not present

## 2016-02-22 DIAGNOSIS — I10 Essential (primary) hypertension: Secondary | ICD-10-CM | POA: Diagnosis not present

## 2016-02-22 DIAGNOSIS — E1165 Type 2 diabetes mellitus with hyperglycemia: Secondary | ICD-10-CM | POA: Diagnosis not present

## 2016-02-22 DIAGNOSIS — M25511 Pain in right shoulder: Secondary | ICD-10-CM | POA: Diagnosis not present

## 2016-02-22 DIAGNOSIS — Z0001 Encounter for general adult medical examination with abnormal findings: Secondary | ICD-10-CM | POA: Diagnosis not present

## 2016-02-22 DIAGNOSIS — M25551 Pain in right hip: Secondary | ICD-10-CM | POA: Diagnosis not present

## 2016-02-22 DIAGNOSIS — E039 Hypothyroidism, unspecified: Secondary | ICD-10-CM | POA: Diagnosis not present

## 2016-04-26 DIAGNOSIS — G8929 Other chronic pain: Secondary | ICD-10-CM | POA: Diagnosis not present

## 2016-04-26 DIAGNOSIS — M25511 Pain in right shoulder: Secondary | ICD-10-CM | POA: Diagnosis not present

## 2016-04-26 DIAGNOSIS — M542 Cervicalgia: Secondary | ICD-10-CM | POA: Diagnosis not present

## 2016-05-17 DIAGNOSIS — N390 Urinary tract infection, site not specified: Secondary | ICD-10-CM | POA: Diagnosis not present

## 2016-05-17 DIAGNOSIS — R319 Hematuria, unspecified: Secondary | ICD-10-CM | POA: Diagnosis not present

## 2016-05-17 DIAGNOSIS — H6241 Otitis externa in other diseases classified elsewhere, right ear: Secondary | ICD-10-CM | POA: Diagnosis not present

## 2016-06-21 DIAGNOSIS — E1165 Type 2 diabetes mellitus with hyperglycemia: Secondary | ICD-10-CM | POA: Diagnosis not present

## 2016-06-21 DIAGNOSIS — E559 Vitamin D deficiency, unspecified: Secondary | ICD-10-CM | POA: Diagnosis not present

## 2016-06-21 DIAGNOSIS — I10 Essential (primary) hypertension: Secondary | ICD-10-CM | POA: Diagnosis not present

## 2016-06-21 DIAGNOSIS — Z0001 Encounter for general adult medical examination with abnormal findings: Secondary | ICD-10-CM | POA: Diagnosis not present

## 2016-06-24 DIAGNOSIS — I6523 Occlusion and stenosis of bilateral carotid arteries: Secondary | ICD-10-CM | POA: Diagnosis not present

## 2016-06-24 DIAGNOSIS — H60513 Acute actinic otitis externa, bilateral: Secondary | ICD-10-CM | POA: Diagnosis not present

## 2016-06-24 DIAGNOSIS — H821 Vertiginous syndromes in diseases classified elsewhere, right ear: Secondary | ICD-10-CM | POA: Diagnosis not present

## 2016-06-24 DIAGNOSIS — N390 Urinary tract infection, site not specified: Secondary | ICD-10-CM | POA: Diagnosis not present

## 2016-06-24 DIAGNOSIS — E1165 Type 2 diabetes mellitus with hyperglycemia: Secondary | ICD-10-CM | POA: Diagnosis not present

## 2016-07-10 DIAGNOSIS — I6522 Occlusion and stenosis of left carotid artery: Secondary | ICD-10-CM | POA: Diagnosis not present

## 2016-08-05 DIAGNOSIS — E782 Mixed hyperlipidemia: Secondary | ICD-10-CM | POA: Diagnosis not present

## 2016-08-05 DIAGNOSIS — N390 Urinary tract infection, site not specified: Secondary | ICD-10-CM | POA: Diagnosis not present

## 2016-08-05 DIAGNOSIS — I10 Essential (primary) hypertension: Secondary | ICD-10-CM | POA: Diagnosis not present

## 2016-08-05 DIAGNOSIS — E039 Hypothyroidism, unspecified: Secondary | ICD-10-CM | POA: Diagnosis not present

## 2016-08-05 DIAGNOSIS — J309 Allergic rhinitis, unspecified: Secondary | ICD-10-CM | POA: Diagnosis not present

## 2016-08-05 DIAGNOSIS — R5381 Other malaise: Secondary | ICD-10-CM | POA: Diagnosis not present

## 2016-08-05 DIAGNOSIS — I6523 Occlusion and stenosis of bilateral carotid arteries: Secondary | ICD-10-CM | POA: Diagnosis not present

## 2016-08-05 DIAGNOSIS — H6241 Otitis externa in other diseases classified elsewhere, right ear: Secondary | ICD-10-CM | POA: Diagnosis not present

## 2016-08-05 DIAGNOSIS — Z23 Encounter for immunization: Secondary | ICD-10-CM | POA: Diagnosis not present

## 2016-08-06 DIAGNOSIS — J189 Pneumonia, unspecified organism: Secondary | ICD-10-CM | POA: Diagnosis not present

## 2016-08-06 DIAGNOSIS — J449 Chronic obstructive pulmonary disease, unspecified: Secondary | ICD-10-CM | POA: Diagnosis not present

## 2016-08-06 DIAGNOSIS — R0602 Shortness of breath: Secondary | ICD-10-CM | POA: Diagnosis not present

## 2016-08-27 DIAGNOSIS — E039 Hypothyroidism, unspecified: Secondary | ICD-10-CM | POA: Diagnosis not present

## 2016-08-27 DIAGNOSIS — I10 Essential (primary) hypertension: Secondary | ICD-10-CM | POA: Diagnosis not present

## 2016-08-27 DIAGNOSIS — N390 Urinary tract infection, site not specified: Secondary | ICD-10-CM | POA: Diagnosis not present

## 2016-08-27 DIAGNOSIS — J449 Chronic obstructive pulmonary disease, unspecified: Secondary | ICD-10-CM | POA: Diagnosis not present

## 2016-08-27 DIAGNOSIS — H6501 Acute serous otitis media, right ear: Secondary | ICD-10-CM | POA: Diagnosis not present

## 2016-10-29 DIAGNOSIS — E039 Hypothyroidism, unspecified: Secondary | ICD-10-CM | POA: Diagnosis not present

## 2016-10-29 DIAGNOSIS — N390 Urinary tract infection, site not specified: Secondary | ICD-10-CM | POA: Diagnosis not present

## 2016-10-29 DIAGNOSIS — E1165 Type 2 diabetes mellitus with hyperglycemia: Secondary | ICD-10-CM | POA: Diagnosis not present

## 2016-10-29 DIAGNOSIS — H6241 Otitis externa in other diseases classified elsewhere, right ear: Secondary | ICD-10-CM | POA: Diagnosis not present

## 2016-10-29 DIAGNOSIS — I10 Essential (primary) hypertension: Secondary | ICD-10-CM | POA: Diagnosis not present

## 2016-11-15 ENCOUNTER — Other Ambulatory Visit: Payer: Self-pay | Admitting: Otolaryngology

## 2016-11-15 DIAGNOSIS — H9201 Otalgia, right ear: Secondary | ICD-10-CM | POA: Diagnosis not present

## 2016-11-15 DIAGNOSIS — H9209 Otalgia, unspecified ear: Secondary | ICD-10-CM

## 2016-11-20 ENCOUNTER — Ambulatory Visit
Admission: RE | Admit: 2016-11-20 | Discharge: 2016-11-20 | Disposition: A | Discharge status: Home / Self Care | Payer: Medicare Other | Source: Ambulatory Visit | Attending: Otolaryngology | Admitting: Otolaryngology

## 2016-11-20 DIAGNOSIS — I700 Atherosclerosis of aorta: Secondary | ICD-10-CM | POA: Insufficient documentation

## 2016-11-20 DIAGNOSIS — M4802 Spinal stenosis, cervical region: Secondary | ICD-10-CM | POA: Diagnosis not present

## 2016-11-20 DIAGNOSIS — F17210 Nicotine dependence, cigarettes, uncomplicated: Secondary | ICD-10-CM | POA: Diagnosis not present

## 2016-11-20 DIAGNOSIS — H9209 Otalgia, unspecified ear: Secondary | ICD-10-CM

## 2016-11-20 LAB — POCT I-STAT CREATININE: CREATININE: 1.2 mg/dL — AB (ref 0.44–1.00)

## 2016-11-20 MED ORDER — IOPAMIDOL (ISOVUE-300) INJECTION 61%: 75.0000 mL | Freq: Once | INTRAVENOUS | Status: DC | PRN

## 2016-11-20 MED ORDER — IOPAMIDOL (ISOVUE-300) INJECTION 61%
60.0000 mL | Freq: Once | INTRAVENOUS | Status: AC | PRN
  Administered 2016-11-20: 60 mL via INTRAVENOUS

## 2017-02-02 ENCOUNTER — Emergency Department
Admission: EM | Admit: 2017-02-02 | Discharge: 2017-02-02 | Disposition: A | Discharge status: Home / Self Care | Payer: Medicare Other | Attending: Emergency Medicine | Admitting: Emergency Medicine

## 2017-02-02 ENCOUNTER — Emergency Department: Payer: Medicare Other

## 2017-02-02 DIAGNOSIS — R251 Tremor, unspecified: Secondary | ICD-10-CM | POA: Diagnosis not present

## 2017-02-02 DIAGNOSIS — E119 Type 2 diabetes mellitus without complications: Secondary | ICD-10-CM | POA: Diagnosis not present

## 2017-02-02 DIAGNOSIS — I10 Essential (primary) hypertension: Secondary | ICD-10-CM | POA: Diagnosis not present

## 2017-02-02 DIAGNOSIS — R410 Disorientation, unspecified: Secondary | ICD-10-CM | POA: Insufficient documentation

## 2017-02-02 DIAGNOSIS — E039 Hypothyroidism, unspecified: Secondary | ICD-10-CM | POA: Diagnosis not present

## 2017-02-02 DIAGNOSIS — Z7982 Long term (current) use of aspirin: Secondary | ICD-10-CM | POA: Insufficient documentation

## 2017-02-02 DIAGNOSIS — R531 Weakness: Secondary | ICD-10-CM | POA: Diagnosis not present

## 2017-02-02 DIAGNOSIS — Z79899 Other long term (current) drug therapy: Secondary | ICD-10-CM | POA: Insufficient documentation

## 2017-02-02 DIAGNOSIS — E860 Dehydration: Secondary | ICD-10-CM | POA: Diagnosis not present

## 2017-02-02 DIAGNOSIS — J449 Chronic obstructive pulmonary disease, unspecified: Secondary | ICD-10-CM | POA: Insufficient documentation

## 2017-02-02 DIAGNOSIS — R0602 Shortness of breath: Secondary | ICD-10-CM | POA: Diagnosis not present

## 2017-02-02 LAB — CBC
HEMATOCRIT: 34.8 % — AB (ref 35.0–47.0)
HEMOGLOBIN: 11.3 g/dL — AB (ref 12.0–16.0)
MCH: 24.4 pg — ABNORMAL LOW (ref 26.0–34.0)
MCHC: 32.5 g/dL (ref 32.0–36.0)
MCV: 75.2 fL — AB (ref 80.0–100.0)
Platelets: 277 10*3/uL (ref 150–440)
RBC: 4.62 MIL/uL (ref 3.80–5.20)
RDW: 17.8 % — ABNORMAL HIGH (ref 11.5–14.5)
WBC: 9.8 10*3/uL (ref 3.6–11.0)

## 2017-02-02 LAB — BASIC METABOLIC PANEL
ANION GAP: 11 (ref 5–15)
BUN: 15 mg/dL (ref 6–20)
CHLORIDE: 107 mmol/L (ref 101–111)
CO2: 22 mmol/L (ref 22–32)
Calcium: 8.7 mg/dL — ABNORMAL LOW (ref 8.9–10.3)
Creatinine, Ser: 1.04 mg/dL — ABNORMAL HIGH (ref 0.44–1.00)
GFR calc Af Amer: 55 mL/min — ABNORMAL LOW (ref >60)
GFR, EST NON AFRICAN AMERICAN: 48 mL/min — AB (ref >60)
Glucose, Bld: 133 mg/dL — ABNORMAL HIGH (ref 65–99)
POTASSIUM: 3.2 mmol/L — AB (ref 3.5–5.1)
SODIUM: 140 mmol/L (ref 135–145)

## 2017-02-02 LAB — URINALYSIS, COMPLETE (UACMP) WITH MICROSCOPIC
BACTERIA UA: NONE SEEN
BILIRUBIN URINE: NEGATIVE
GLUCOSE, UA: NEGATIVE mg/dL
Ketones, ur: NEGATIVE mg/dL
LEUKOCYTES UA: NEGATIVE
NITRITE: NEGATIVE
PROTEIN: NEGATIVE mg/dL
SPECIFIC GRAVITY, URINE: 1.009 (ref 1.005–1.030)
pH: 5 (ref 5.0–8.0)

## 2017-02-02 LAB — GLUCOSE, CAPILLARY: Glucose-Capillary: 153 mg/dL — ABNORMAL HIGH (ref 65–99)

## 2017-02-02 LAB — CK: CK TOTAL: 46 U/L (ref 38–234)

## 2017-02-02 LAB — TROPONIN I

## 2017-02-02 MED ORDER — SODIUM CHLORIDE 0.9 % IV BOLUS (SEPSIS)
1000.0000 mL | Freq: Once | INTRAVENOUS | Status: AC
  Administered 2017-02-02: 1000 mL via INTRAVENOUS

## 2017-02-02 NOTE — Discharge Instructions (Signed)
Please return to the emergency department for any new or worsening symptoms such as chest pain, shortness of breath, or for any other concerns whatsoever. Otherwise please follow-up with your primary care physician as scheduled. Please make sure you remain well-hydrated.  It was a pleasure to take care of you today, and thank you for coming to our emergency department.  If you have any questions or concerns before leaving please ask the nurse to grab me and I'm more than happy to go through your aftercare instructions again.  If you were prescribed any opioid pain medication today such as Norco, Vicodin, Percocet, morphine, hydrocodone, or oxycodone please make sure you do not drive when you are taking this medication as it can alter your ability to drive safely.  If you have any concerns once you are home that you are not improving or are in fact getting worse before you can make it to your follow-up appointment, please do not hesitate to call 911 and come back for further evaluation.  Darel Hong MD  Results for orders placed or performed during the hospital encounter of 38/25/05  Basic metabolic panel  Result Value Ref Range   Sodium 140 135 - 145 mmol/L   Potassium 3.2 (L) 3.5 - 5.1 mmol/L   Chloride 107 101 - 111 mmol/L   CO2 22 22 - 32 mmol/L   Glucose, Bld 133 (H) 65 - 99 mg/dL   BUN 15 6 - 20 mg/dL   Creatinine, Ser 1.04 (H) 0.44 - 1.00 mg/dL   Calcium 8.7 (L) 8.9 - 10.3 mg/dL   GFR calc non Af Amer 48 (L) >60 mL/min   GFR calc Af Amer 55 (L) >60 mL/min   Anion gap 11 5 - 15  CBC  Result Value Ref Range   WBC 9.8 3.6 - 11.0 K/uL   RBC 4.62 3.80 - 5.20 MIL/uL   Hemoglobin 11.3 (L) 12.0 - 16.0 g/dL   HCT 34.8 (L) 35.0 - 47.0 %   MCV 75.2 (L) 80.0 - 100.0 fL   MCH 24.4 (L) 26.0 - 34.0 pg   MCHC 32.5 32.0 - 36.0 g/dL   RDW 17.8 (H) 11.5 - 14.5 %   Platelets 277 150 - 440 K/uL  Urinalysis, Complete w Microscopic  Result Value Ref Range   Color, Urine YELLOW (A) YELLOW   APPearance CLEAR (A) CLEAR   Specific Gravity, Urine 1.009 1.005 - 1.030   pH 5.0 5.0 - 8.0   Glucose, UA NEGATIVE NEGATIVE mg/dL   Hgb urine dipstick MODERATE (A) NEGATIVE   Bilirubin Urine NEGATIVE NEGATIVE   Ketones, ur NEGATIVE NEGATIVE mg/dL   Protein, ur NEGATIVE NEGATIVE mg/dL   Nitrite NEGATIVE NEGATIVE   Leukocytes, UA NEGATIVE NEGATIVE   RBC / HPF 0-5 0 - 5 RBC/hpf   WBC, UA 0-5 0 - 5 WBC/hpf   Bacteria, UA NONE SEEN NONE SEEN   Squamous Epithelial / LPF 0-5 (A) NONE SEEN   Mucous PRESENT   Troponin I  Result Value Ref Range   Troponin I <0.03 <0.03 ng/mL  CK  Result Value Ref Range   Total CK 46 38 - 234 U/L   Dg Chest 2 View  Result Date: 02/02/2017 CLINICAL DATA:  Shortness of breath.  History of lung carcinoma EXAM: CHEST  2 VIEW COMPARISON:  November 21, 2015 FINDINGS: Postoperative changes noted on the right with mild volume loss and scarring. There is no edema or consolidation. No mass or adenopathy evident. There is aortic atherosclerosis.  There is mild degenerative change in the thoracic spine. IMPRESSION: Postoperative change on the right with volume loss and scarring. No edema or consolidation. Stable cardiac silhouette. There is aortic atherosclerosis. Electronically Signed   By: Lowella Grip III M.D.   On: 02/02/2017 11:01   Ct Head Wo Contrast  Result Date: 02/02/2017 CLINICAL DATA:  Patient with diffuse weakness.  Confusion. EXAM: CT HEAD WITHOUT CONTRAST TECHNIQUE: Contiguous axial images were obtained from the base of the skull through the vertex without intravenous contrast. COMPARISON:  Neck CT 11/20/2016 FINDINGS: Brain: Ventricles and sulci are prominent compatible with atrophy. Periventricular subcortical white matter hypodensity compatible with chronic microvascular ischemic changes. No evidence for acute cortically based infarct, intracranial hemorrhage, mass lesion or mass-effect. Vascular: Unremarkable Skull: Intact. Sinuses/Orbits: Paranasal  sinuses are well aerated. Mastoid air cells are unremarkable. Other: None. IMPRESSION: No acute intracranial process. Cortical atrophy and chronic microvascular ischemic changes. Electronically Signed   By: Lovey Newcomer M.D.   On: 02/02/2017 12:38

## 2017-02-02 NOTE — ED Triage Notes (Signed)
Pt presents via EMS c/o "jitteriness" and SOB for a couple days. Poor skin turgor per EMS. Pt reports decreased PO intake.

## 2017-02-02 NOTE — ED Notes (Signed)
ED Provider at bedside. 

## 2017-02-02 NOTE — ED Provider Notes (Signed)
[facility] Emergency Department Provider Note  ____________________________________________   First MD Initiated Contact with Patient 02/02/17 1118     (approximate)  I have reviewed the triage vital signs and the nursing notes.   HISTORY  Chief Complaint Shortness of Breath; Weakness; and Dehydration    HPI Samantha Franco is a 83 y.o. female who comes to the emergency department with her family for roughly 24 hours of generalized fatigue. No shortness of breath. No chest pain. No abdominal pain nausea or vomiting. She just feels generally lightheaded. Denies vertigo. Denies fevers or chills.  No headache. No numbness or weakness. No vertigo. No sore throat. No ear pain. No myalgias. No dysuria frequency or hesitancy. She does report decreased oral intake.   Past Medical History:  Diagnosis Date  . Arthritis   . Chorea   . COPD (chronic obstructive pulmonary disease) ([place])   . Diabetes mellitus without complication ([place])   . Ear infection   . Environmental allergies   . GERD (gastroesophageal reflux disease)   . Hx of rheumatic fever   . Hx: UTI (urinary tract infection)   . Hyperlipidemia   . Hyperlipidemia   . Hypertension   . Hypothyroidism   . Kidney disease   . Lung cancer ([place])   . Mini stroke ([place])   . Stroke ([place])    x's 2  . Vertigo     Patient Active Problem List   Diagnosis Date Noted  . Pain in the chest 03/21/2015  . Essential hypertension 03/21/2015    Past Surgical History:  Procedure Laterality Date  . CARDIAC CATHETERIZATION     [facility]  . CATARACT EXTRACTION    . LUNG LOBECTOMY    . THYROID SURGERY      Prior to Admission medications   Medication Sig Start Date End Date Taking? Authorizing Provider  allopurinol (ZYLOPRIM) 100 MG tablet Take 100-200 mg by mouth daily.   Yes Historical Provider, MD  amLODipine (NORVASC) 5 MG tablet Take 5 mg by mouth daily.   Yes Historical Provider, MD  aspirin 325 MG tablet  Take 325 mg by mouth daily.   Yes Historical Provider, MD  bisoprolol-hydrochlorothiazide (ZIAC) 5-6.25 MG per tablet Take 1 tablet by mouth daily.   Yes Historical Provider, MD  citalopram (CELEXA) 20 MG tablet Take 20 mg by mouth daily.   Yes Historical Provider, MD  docusate sodium (COLACE) 100 MG capsule Take 100 mg by mouth daily.   Yes Historical Provider, MD  levothyroxine (SYNTHROID, LEVOTHROID) 75 MCG tablet Take 75 mcg by mouth daily. 12/26/16  Yes Historical Provider, MD  meloxicam (MOBIC) 7.5 MG tablet Take 7.5 mg by mouth daily.   Yes Historical Provider, MD  NOVOLOG MIX 70/30 FLEXPEN (70-30) 100 UNIT/ML FlexPen Inject 8 Units into the skin at bedtime. 11/22/16  Yes Historical Provider, MD  zolpidem (AMBIEN) 5 MG tablet Take 5 mg by mouth at bedtime.    Yes Historical Provider, MD    Allergies Sulfa antibiotics; Vasotec [enalapril]; and Prednisone  Family History  Problem Relation Age of Onset  . Heart attack Father   . Heart disease Father     Social History Social History  Substance Use Topics  . Smoking status: Never Smoker  . Smokeless tobacco: Never Used  . Alcohol use No    Review of Systems Constitutional: No fever/chills Eyes: No visual changes. ENT: No sore throat. Cardiovascular: Denies chest pain. Respiratory: Denies shortness of breath. Gastrointestinal: No abdominal pain.  No nausea, no vomiting.  No diarrhea.  No constipation. Genitourinary: Negative for dysuria. Musculoskeletal: Negative for back pain. Skin: Negative for rash. Neurological: Negative for headaches, focal weakness or numbness.  10-point ROS otherwise negative.  ____________________________________________   PHYSICAL EXAM:  VITAL SIGNS: ED Triage Vitals  Enc Vitals Group     BP 02/02/17 1019 (!) 172/51     Pulse Rate 02/02/17 1019 72     Resp 02/02/17 1019 16     Temp 02/02/17 1019 98.4 F (36.9 C)     Temp Source 02/02/17 1019 Oral     SpO2 02/02/17 1019 99 %     Weight  02/02/17 1023 144 lb (65.3 kg)     Height 02/02/17 1023 '5\' 2"'$  (1.575 m)     Head Circumference --      Peak Flow --      Pain Score --      Pain Loc --      Pain Edu? --      Excl. in [place]? --     Constitutional: Alert and oriented x 4 Pleasant cooperative no acute distress Eyes: PERRL EOMI. no nystagmus Head: Atraumatic. Nose: No congestion/rhinnorhea. Mouth/Throat: No trismus Neck: No stridor.   Cardiovascular: Normal rate, regular rhythm. Grossly normal heart sounds.  Good peripheral circulation. Respiratory: Normal respiratory effort.  No retractions. Lungs CTAB and moving good air Gastrointestinal: Soft nondistended nontender no rebound no guarding no peritonitis no McBurney's tenderness negative Rovsing's no costovertebral tenderness negative Murphy's Musculoskeletal: No lower extremity edema   Neurologic:  Normal speech and language. No gross focal neurologic deficits are appreciated. No pronator drift cranial nerves II through XII intact normal finger-nose-finger no dysdiadochokinesis Skin:  Skin is warm, dry and intact. No rash noted. Psychiatric: Mood and affect are normal. Speech and behavior are normal.    ____________________________________________   DIFFERENTIAL  Acute coronary syndrome, dehydration, fatigue, stroke, hypothyroidism, influenza, rhabdomyolysis ____________________________________________   LABS (all labs ordered are listed, but only abnormal results are displayed)  Labs Reviewed  BASIC METABOLIC PANEL - Abnormal; Notable for the following:       Result Value   Potassium 3.2 (*)    Glucose, Bld 133 (*)    Creatinine, Ser 1.04 (*)    Calcium 8.7 (*)    GFR calc non Af Amer 48 (*)    GFR calc Af Amer 55 (*)    All other components within normal limits  CBC - Abnormal; Notable for the following:    Hemoglobin 11.3 (*)    HCT 34.8 (*)    MCV 75.2 (*)    MCH 24.4 (*)    RDW 17.8 (*)    All other components within normal limits  URINALYSIS,  COMPLETE (UACMP) WITH MICROSCOPIC - Abnormal; Notable for the following:    Color, Urine YELLOW (*)    APPearance CLEAR (*)    Hgb urine dipstick MODERATE (*)    Squamous Epithelial / LPF 0-5 (*)    All other components within normal limits  GLUCOSE, CAPILLARY - Abnormal; Notable for the following:    Glucose-Capillary 153 (*)    All other components within normal limits  TROPONIN I  CK    Ketones in the urine are consistent with dehydration chronic kidney disease unchanged from previous  EKG  ED ECG REPORT I, Darel Hong, the attending physician, personally viewed and interpreted this ECG.  Date: 02/02/2017 Rate: 77 Rhythm: normal sinus rhythm QRS Axis: normal Intervals: normal ST/T Wave abnormalities: normal Conduction  Disturbances: none Narrative Interpretation: unremarkable  ____________________________________________  RADIOLOGY  Chest x-ray with no acute disease head CT with no acute disease chest chronic changes ____________________________________________   PROCEDURES  Procedure(s) performed: no  Procedures  Critical Care performed: no  ____________________________________________   INITIAL IMPRESSION / ASSESSMENT AND PLAN / ED COURSE  Pertinent labs & imaging results that were available during my care of the patient were reviewed by me and considered in my medical decision making (see chart for details).  The patient appears tired, although is hemodynamically stable with a nonfocal exam. By the time I'd seen her she had R he had basic labs drawn which are essentially unremarkable. Given her generalized fatigue and advanced age and we'll add on a troponin and head CT and reevaluate.  After 1 L of fluid the patient feels improved. I had a lengthy discussion with the patient and her family at bedside regarding her diagnosis and everyone together feels comfortable having her treated as an outpatient.  Just prior to discharge the patient had a brief  episode of shaking but was able to talk throughout. She reports no new symptoms aside from persistent fatigue. One more liter will be given and then she will remain stable for discharge.   ____________________________________________   FINAL CLINICAL IMPRESSION(S) / ED DIAGNOSES  Final diagnoses:  Weakness  Dehydration      NEW MEDICATIONS STARTED DURING THIS VISIT:  New Prescriptions   No medications on file     Note:  This document was prepared using Dragon voice recognition software and may include unintentional dictation errors.     Darel Hong, MD 02/02/17 [phone]

## 2017-02-02 NOTE — ED Notes (Signed)
Patient transported to X-ray 

## 2017-02-18 DIAGNOSIS — E039 Hypothyroidism, unspecified: Secondary | ICD-10-CM | POA: Diagnosis not present

## 2017-02-18 DIAGNOSIS — R4182 Altered mental status, unspecified: Secondary | ICD-10-CM | POA: Diagnosis not present

## 2017-02-18 DIAGNOSIS — M1A0390 Idiopathic chronic gout, unspecified wrist, without tophus (tophi): Secondary | ICD-10-CM | POA: Diagnosis not present

## 2017-02-18 DIAGNOSIS — I10 Essential (primary) hypertension: Secondary | ICD-10-CM | POA: Diagnosis not present

## 2017-02-18 DIAGNOSIS — E1122 Type 2 diabetes mellitus with diabetic chronic kidney disease: Secondary | ICD-10-CM | POA: Diagnosis not present

## 2017-02-27 ENCOUNTER — Other Ambulatory Visit: Payer: Self-pay | Admitting: Internal Medicine

## 2017-02-27 DIAGNOSIS — R4182 Altered mental status, unspecified: Secondary | ICD-10-CM

## 2017-03-03 DIAGNOSIS — Z0001 Encounter for general adult medical examination with abnormal findings: Secondary | ICD-10-CM | POA: Diagnosis not present

## 2017-03-03 DIAGNOSIS — N390 Urinary tract infection, site not specified: Secondary | ICD-10-CM | POA: Diagnosis not present

## 2017-03-03 DIAGNOSIS — F419 Anxiety disorder, unspecified: Secondary | ICD-10-CM | POA: Diagnosis not present

## 2017-03-03 DIAGNOSIS — E876 Hypokalemia: Secondary | ICD-10-CM | POA: Diagnosis not present

## 2017-03-03 DIAGNOSIS — E860 Dehydration: Secondary | ICD-10-CM | POA: Diagnosis not present

## 2017-03-03 DIAGNOSIS — E039 Hypothyroidism, unspecified: Secondary | ICD-10-CM | POA: Diagnosis not present

## 2017-03-03 DIAGNOSIS — I10 Essential (primary) hypertension: Secondary | ICD-10-CM | POA: Diagnosis not present

## 2017-03-03 DIAGNOSIS — E1165 Type 2 diabetes mellitus with hyperglycemia: Secondary | ICD-10-CM | POA: Diagnosis not present

## 2017-03-10 ENCOUNTER — Ambulatory Visit
Admission: RE | Admit: 2017-03-10 | Discharge: 2017-03-10 | Disposition: A | Discharge status: Home / Self Care | Payer: Medicare Other | Source: Ambulatory Visit | Attending: Internal Medicine | Admitting: Internal Medicine

## 2017-03-10 DIAGNOSIS — R4182 Altered mental status, unspecified: Secondary | ICD-10-CM | POA: Diagnosis not present

## 2017-03-10 DIAGNOSIS — I739 Peripheral vascular disease, unspecified: Secondary | ICD-10-CM | POA: Insufficient documentation

## 2017-04-01 DIAGNOSIS — F331 Major depressive disorder, recurrent, moderate: Secondary | ICD-10-CM | POA: Diagnosis not present

## 2017-04-01 DIAGNOSIS — N390 Urinary tract infection, site not specified: Secondary | ICD-10-CM | POA: Diagnosis not present

## 2017-04-01 DIAGNOSIS — E119 Type 2 diabetes mellitus without complications: Secondary | ICD-10-CM | POA: Diagnosis not present

## 2017-04-01 DIAGNOSIS — I10 Essential (primary) hypertension: Secondary | ICD-10-CM | POA: Diagnosis not present

## 2017-06-08 DIAGNOSIS — S0990XA Unspecified injury of head, initial encounter: Secondary | ICD-10-CM | POA: Diagnosis not present

## 2017-06-08 DIAGNOSIS — Z8673 Personal history of transient ischemic attack (TIA), and cerebral infarction without residual deficits: Secondary | ICD-10-CM | POA: Diagnosis not present

## 2017-06-08 DIAGNOSIS — N179 Acute kidney failure, unspecified: Secondary | ICD-10-CM | POA: Diagnosis not present

## 2017-06-08 DIAGNOSIS — R600 Localized edema: Secondary | ICD-10-CM | POA: Diagnosis not present

## 2017-06-08 DIAGNOSIS — S134XXA Sprain of ligaments of cervical spine, initial encounter: Secondary | ICD-10-CM | POA: Diagnosis present

## 2017-06-08 DIAGNOSIS — S0993XA Unspecified injury of face, initial encounter: Secondary | ICD-10-CM | POA: Diagnosis not present

## 2017-06-08 DIAGNOSIS — S12190A Other displaced fracture of second cervical vertebra, initial encounter for closed fracture: Secondary | ICD-10-CM | POA: Diagnosis not present

## 2017-06-08 DIAGNOSIS — W19XXXA Unspecified fall, initial encounter: Secondary | ICD-10-CM | POA: Diagnosis not present

## 2017-06-08 DIAGNOSIS — N189 Chronic kidney disease, unspecified: Secondary | ICD-10-CM | POA: Diagnosis present

## 2017-06-08 DIAGNOSIS — S12300D Unspecified displaced fracture of fourth cervical vertebra, subsequent encounter for fracture with routine healing: Secondary | ICD-10-CM | POA: Diagnosis not present

## 2017-06-08 DIAGNOSIS — M546 Pain in thoracic spine: Secondary | ICD-10-CM | POA: Diagnosis not present

## 2017-06-08 DIAGNOSIS — M1990 Unspecified osteoarthritis, unspecified site: Secondary | ICD-10-CM | POA: Diagnosis not present

## 2017-06-08 DIAGNOSIS — Z85118 Personal history of other malignant neoplasm of bronchus and lung: Secondary | ICD-10-CM | POA: Diagnosis not present

## 2017-06-08 DIAGNOSIS — R2689 Other abnormalities of gait and mobility: Secondary | ICD-10-CM | POA: Diagnosis not present

## 2017-06-08 DIAGNOSIS — W109XXA Fall (on) (from) unspecified stairs and steps, initial encounter: Secondary | ICD-10-CM | POA: Diagnosis not present

## 2017-06-08 DIAGNOSIS — E785 Hyperlipidemia, unspecified: Secondary | ICD-10-CM | POA: Diagnosis present

## 2017-06-08 DIAGNOSIS — Z7982 Long term (current) use of aspirin: Secondary | ICD-10-CM | POA: Diagnosis not present

## 2017-06-08 DIAGNOSIS — S0512XA Contusion of eyeball and orbital tissues, left eye, initial encounter: Secondary | ICD-10-CM | POA: Diagnosis not present

## 2017-06-08 DIAGNOSIS — S12100D Unspecified displaced fracture of second cervical vertebra, subsequent encounter for fracture with routine healing: Secondary | ICD-10-CM | POA: Diagnosis not present

## 2017-06-08 DIAGNOSIS — S0083XA Contusion of other part of head, initial encounter: Secondary | ICD-10-CM | POA: Diagnosis not present

## 2017-06-08 DIAGNOSIS — S4992XA Unspecified injury of left shoulder and upper arm, initial encounter: Secondary | ICD-10-CM | POA: Diagnosis not present

## 2017-06-08 DIAGNOSIS — I129 Hypertensive chronic kidney disease with stage 1 through stage 4 chronic kidney disease, or unspecified chronic kidney disease: Secondary | ICD-10-CM | POA: Diagnosis present

## 2017-06-08 DIAGNOSIS — M4802 Spinal stenosis, cervical region: Secondary | ICD-10-CM | POA: Diagnosis not present

## 2017-06-08 DIAGNOSIS — M6281 Muscle weakness (generalized): Secondary | ICD-10-CM | POA: Diagnosis not present

## 2017-06-08 DIAGNOSIS — G8911 Acute pain due to trauma: Secondary | ICD-10-CM | POA: Diagnosis not present

## 2017-06-08 DIAGNOSIS — M25512 Pain in left shoulder: Secondary | ICD-10-CM | POA: Diagnosis not present

## 2017-06-08 DIAGNOSIS — E039 Hypothyroidism, unspecified: Secondary | ICD-10-CM | POA: Diagnosis not present

## 2017-06-08 DIAGNOSIS — S12200D Unspecified displaced fracture of third cervical vertebra, subsequent encounter for fracture with routine healing: Secondary | ICD-10-CM | POA: Diagnosis not present

## 2017-06-08 DIAGNOSIS — I10 Essential (primary) hypertension: Secondary | ICD-10-CM | POA: Diagnosis not present

## 2017-06-08 DIAGNOSIS — R262 Difficulty in walking, not elsewhere classified: Secondary | ICD-10-CM | POA: Diagnosis not present

## 2017-06-08 DIAGNOSIS — M11231 Other chondrocalcinosis, right wrist: Secondary | ICD-10-CM | POA: Diagnosis not present

## 2017-06-08 DIAGNOSIS — M85851 Other specified disorders of bone density and structure, right thigh: Secondary | ICD-10-CM | POA: Diagnosis not present

## 2017-06-08 DIAGNOSIS — Z87891 Personal history of nicotine dependence: Secondary | ICD-10-CM | POA: Diagnosis not present

## 2017-06-08 DIAGNOSIS — E1122 Type 2 diabetes mellitus with diabetic chronic kidney disease: Secondary | ICD-10-CM | POA: Diagnosis present

## 2017-06-08 DIAGNOSIS — S12100A Unspecified displaced fracture of second cervical vertebra, initial encounter for closed fracture: Secondary | ICD-10-CM | POA: Diagnosis not present

## 2017-06-08 DIAGNOSIS — S12290A Other displaced fracture of third cervical vertebra, initial encounter for closed fracture: Secondary | ICD-10-CM | POA: Diagnosis not present

## 2017-06-08 DIAGNOSIS — S199XXA Unspecified injury of neck, initial encounter: Secondary | ICD-10-CM | POA: Diagnosis not present

## 2017-06-08 DIAGNOSIS — Z882 Allergy status to sulfonamides status: Secondary | ICD-10-CM | POA: Diagnosis not present

## 2017-06-08 DIAGNOSIS — Z043 Encounter for examination and observation following other accident: Secondary | ICD-10-CM | POA: Diagnosis not present

## 2017-06-08 DIAGNOSIS — M2578 Osteophyte, vertebrae: Secondary | ICD-10-CM | POA: Diagnosis not present

## 2017-06-08 DIAGNOSIS — M79644 Pain in right finger(s): Secondary | ICD-10-CM | POA: Diagnosis not present

## 2017-06-08 DIAGNOSIS — R9431 Abnormal electrocardiogram [ECG] [EKG]: Secondary | ICD-10-CM | POA: Diagnosis not present

## 2017-06-11 DIAGNOSIS — E1122 Type 2 diabetes mellitus with diabetic chronic kidney disease: Secondary | ICD-10-CM | POA: Diagnosis not present

## 2017-06-11 DIAGNOSIS — M6281 Muscle weakness (generalized): Secondary | ICD-10-CM | POA: Diagnosis not present

## 2017-06-11 DIAGNOSIS — N189 Chronic kidney disease, unspecified: Secondary | ICD-10-CM | POA: Diagnosis not present

## 2017-06-11 DIAGNOSIS — R600 Localized edema: Secondary | ICD-10-CM | POA: Diagnosis not present

## 2017-06-11 DIAGNOSIS — M1990 Unspecified osteoarthritis, unspecified site: Secondary | ICD-10-CM | POA: Diagnosis not present

## 2017-06-11 DIAGNOSIS — R262 Difficulty in walking, not elsewhere classified: Secondary | ICD-10-CM | POA: Diagnosis not present

## 2017-06-11 DIAGNOSIS — E119 Type 2 diabetes mellitus without complications: Secondary | ICD-10-CM | POA: Diagnosis not present

## 2017-06-11 DIAGNOSIS — R2689 Other abnormalities of gait and mobility: Secondary | ICD-10-CM | POA: Diagnosis not present

## 2017-06-11 DIAGNOSIS — S12200D Unspecified displaced fracture of third cervical vertebra, subsequent encounter for fracture with routine healing: Secondary | ICD-10-CM | POA: Diagnosis not present

## 2017-06-11 DIAGNOSIS — S12100D Unspecified displaced fracture of second cervical vertebra, subsequent encounter for fracture with routine healing: Secondary | ICD-10-CM | POA: Diagnosis not present

## 2017-06-11 DIAGNOSIS — I10 Essential (primary) hypertension: Secondary | ICD-10-CM | POA: Diagnosis not present

## 2017-06-11 DIAGNOSIS — E039 Hypothyroidism, unspecified: Secondary | ICD-10-CM | POA: Diagnosis not present

## 2017-06-11 DIAGNOSIS — M7981 Nontraumatic hematoma of soft tissue: Secondary | ICD-10-CM | POA: Diagnosis not present

## 2017-06-11 DIAGNOSIS — M2578 Osteophyte, vertebrae: Secondary | ICD-10-CM | POA: Diagnosis not present

## 2017-06-11 DIAGNOSIS — S12300D Unspecified displaced fracture of fourth cervical vertebra, subsequent encounter for fracture with routine healing: Secondary | ICD-10-CM | POA: Diagnosis not present

## 2017-06-11 DIAGNOSIS — S129XXD Fracture of neck, unspecified, subsequent encounter: Secondary | ICD-10-CM | POA: Diagnosis not present

## 2017-06-11 DIAGNOSIS — E785 Hyperlipidemia, unspecified: Secondary | ICD-10-CM | POA: Diagnosis not present

## 2017-06-12 DIAGNOSIS — M7981 Nontraumatic hematoma of soft tissue: Secondary | ICD-10-CM | POA: Diagnosis not present

## 2017-06-12 DIAGNOSIS — E119 Type 2 diabetes mellitus without complications: Secondary | ICD-10-CM | POA: Diagnosis not present

## 2017-06-12 DIAGNOSIS — I10 Essential (primary) hypertension: Secondary | ICD-10-CM | POA: Diagnosis not present

## 2017-06-12 DIAGNOSIS — S129XXD Fracture of neck, unspecified, subsequent encounter: Secondary | ICD-10-CM | POA: Diagnosis not present

## 2017-06-12 DIAGNOSIS — E039 Hypothyroidism, unspecified: Secondary | ICD-10-CM | POA: Diagnosis not present

## 2017-07-10 DIAGNOSIS — S1980XS Other specified injuries of unspecified part of neck, sequela: Secondary | ICD-10-CM | POA: Diagnosis not present

## 2017-07-10 DIAGNOSIS — E1165 Type 2 diabetes mellitus with hyperglycemia: Secondary | ICD-10-CM | POA: Diagnosis not present

## 2017-07-10 DIAGNOSIS — E876 Hypokalemia: Secondary | ICD-10-CM | POA: Diagnosis not present

## 2017-07-10 DIAGNOSIS — I10 Essential (primary) hypertension: Secondary | ICD-10-CM | POA: Diagnosis not present

## 2017-07-23 DIAGNOSIS — S12101D Unspecified nondisplaced fracture of second cervical vertebra, subsequent encounter for fracture with routine healing: Secondary | ICD-10-CM | POA: Insufficient documentation

## 2017-07-23 DIAGNOSIS — M47812 Spondylosis without myelopathy or radiculopathy, cervical region: Secondary | ICD-10-CM | POA: Diagnosis not present

## 2017-07-23 DIAGNOSIS — M4712 Other spondylosis with myelopathy, cervical region: Secondary | ICD-10-CM | POA: Insufficient documentation

## 2017-07-23 DIAGNOSIS — Y33XXXD Other specified events, undetermined intent, subsequent encounter: Secondary | ICD-10-CM | POA: Diagnosis not present

## 2017-08-13 DIAGNOSIS — J019 Acute sinusitis, unspecified: Secondary | ICD-10-CM | POA: Diagnosis not present

## 2017-08-13 DIAGNOSIS — F419 Anxiety disorder, unspecified: Secondary | ICD-10-CM | POA: Diagnosis not present

## 2017-08-13 DIAGNOSIS — I10 Essential (primary) hypertension: Secondary | ICD-10-CM | POA: Diagnosis not present

## 2017-08-13 DIAGNOSIS — S1980XS Other specified injuries of unspecified part of neck, sequela: Secondary | ICD-10-CM | POA: Diagnosis not present

## 2017-08-13 DIAGNOSIS — Z23 Encounter for immunization: Secondary | ICD-10-CM | POA: Diagnosis not present

## 2017-08-13 DIAGNOSIS — F0390 Unspecified dementia without behavioral disturbance: Secondary | ICD-10-CM | POA: Diagnosis not present

## 2017-09-09 DIAGNOSIS — M542 Cervicalgia: Secondary | ICD-10-CM | POA: Diagnosis not present

## 2017-09-11 DIAGNOSIS — M542 Cervicalgia: Secondary | ICD-10-CM | POA: Diagnosis not present

## 2017-09-16 DIAGNOSIS — M542 Cervicalgia: Secondary | ICD-10-CM | POA: Diagnosis not present

## 2017-09-18 DIAGNOSIS — M542 Cervicalgia: Secondary | ICD-10-CM | POA: Diagnosis not present

## 2017-09-20 ENCOUNTER — Other Ambulatory Visit: Payer: Self-pay

## 2017-09-20 ENCOUNTER — Emergency Department
Admission: EM | Admit: 2017-09-20 | Discharge: 2017-09-20 | Disposition: A | Discharge status: Home / Self Care | Payer: Medicare Other | Attending: Emergency Medicine | Admitting: Emergency Medicine

## 2017-09-20 DIAGNOSIS — Z794 Long term (current) use of insulin: Secondary | ICD-10-CM | POA: Diagnosis not present

## 2017-09-20 DIAGNOSIS — J449 Chronic obstructive pulmonary disease, unspecified: Secondary | ICD-10-CM | POA: Diagnosis not present

## 2017-09-20 DIAGNOSIS — E119 Type 2 diabetes mellitus without complications: Secondary | ICD-10-CM | POA: Insufficient documentation

## 2017-09-20 DIAGNOSIS — E039 Hypothyroidism, unspecified: Secondary | ICD-10-CM | POA: Insufficient documentation

## 2017-09-20 DIAGNOSIS — Z85110 Personal history of malignant carcinoid tumor of bronchus and lung: Secondary | ICD-10-CM | POA: Insufficient documentation

## 2017-09-20 DIAGNOSIS — Z7982 Long term (current) use of aspirin: Secondary | ICD-10-CM | POA: Diagnosis not present

## 2017-09-20 DIAGNOSIS — Z79899 Other long term (current) drug therapy: Secondary | ICD-10-CM | POA: Diagnosis not present

## 2017-09-20 DIAGNOSIS — E860 Dehydration: Secondary | ICD-10-CM | POA: Diagnosis not present

## 2017-09-20 DIAGNOSIS — F05 Delirium due to known physiological condition: Secondary | ICD-10-CM | POA: Diagnosis not present

## 2017-09-20 DIAGNOSIS — R531 Weakness: Secondary | ICD-10-CM | POA: Diagnosis present

## 2017-09-20 DIAGNOSIS — Z8673 Personal history of transient ischemic attack (TIA), and cerebral infarction without residual deficits: Secondary | ICD-10-CM | POA: Insufficient documentation

## 2017-09-20 DIAGNOSIS — N390 Urinary tract infection, site not specified: Secondary | ICD-10-CM

## 2017-09-20 DIAGNOSIS — I10 Essential (primary) hypertension: Secondary | ICD-10-CM | POA: Diagnosis not present

## 2017-09-20 DIAGNOSIS — R5381 Other malaise: Secondary | ICD-10-CM | POA: Diagnosis not present

## 2017-09-20 LAB — URINALYSIS, COMPLETE (UACMP) WITH MICROSCOPIC
Bilirubin Urine: NEGATIVE
Glucose, UA: NEGATIVE mg/dL
Ketones, ur: NEGATIVE mg/dL
NITRITE: NEGATIVE
PROTEIN: NEGATIVE mg/dL
Specific Gravity, Urine: 1.011 (ref 1.005–1.030)
pH: 6 (ref 5.0–8.0)

## 2017-09-20 LAB — CBC WITH DIFFERENTIAL/PLATELET
BASOS ABS: 0.1 10*3/uL (ref 0–0.1)
BASOS PCT: 1 %
Eosinophils Absolute: 0.3 10*3/uL (ref 0–0.7)
Eosinophils Relative: 3 %
HEMATOCRIT: 37.9 % (ref 35.0–47.0)
HEMOGLOBIN: 11.9 g/dL — AB (ref 12.0–16.0)
Lymphocytes Relative: 19 %
Lymphs Abs: 1.6 10*3/uL (ref 1.0–3.6)
MCH: 24.9 pg — ABNORMAL LOW (ref 26.0–34.0)
MCHC: 31.5 g/dL — ABNORMAL LOW (ref 32.0–36.0)
MCV: 78.8 fL — ABNORMAL LOW (ref 80.0–100.0)
MONO ABS: 0.6 10*3/uL (ref 0.2–0.9)
MONOS PCT: 7 %
NEUTROS ABS: 5.7 10*3/uL (ref 1.4–6.5)
Neutrophils Relative %: 70 %
Platelets: 287 10*3/uL (ref 150–440)
RBC: 4.8 MIL/uL (ref 3.80–5.20)
RDW: 17.5 % — ABNORMAL HIGH (ref 11.5–14.5)
WBC: 8.2 10*3/uL (ref 3.6–11.0)

## 2017-09-20 LAB — COMPREHENSIVE METABOLIC PANEL
ALBUMIN: 3.9 g/dL (ref 3.5–5.0)
ALK PHOS: 56 U/L (ref 38–126)
ALT: 11 U/L — AB (ref 14–54)
AST: 21 U/L (ref 15–41)
Anion gap: 13 (ref 5–15)
BILIRUBIN TOTAL: 0.7 mg/dL (ref 0.3–1.2)
BUN: 28 mg/dL — AB (ref 6–20)
CALCIUM: 9.6 mg/dL (ref 8.9–10.3)
CO2: 25 mmol/L (ref 22–32)
CREATININE: 1.21 mg/dL — AB (ref 0.44–1.00)
Chloride: 100 mmol/L — ABNORMAL LOW (ref 101–111)
GFR calc Af Amer: 46 mL/min — ABNORMAL LOW (ref >60)
GFR calc non Af Amer: 39 mL/min — ABNORMAL LOW (ref >60)
GLUCOSE: 117 mg/dL — AB (ref 65–99)
POTASSIUM: 3.7 mmol/L (ref 3.5–5.1)
Sodium: 138 mmol/L (ref 135–145)
TOTAL PROTEIN: 7.3 g/dL (ref 6.5–8.1)

## 2017-09-20 LAB — TROPONIN I: Troponin I: <0.03 ng/mL (ref <0.03)

## 2017-09-20 LAB — LIPASE, BLOOD: Lipase: 34 U/L (ref 11–51)

## 2017-09-20 LAB — TSH: TSH: 2.574 u[IU]/mL (ref 0.350–4.500)

## 2017-09-20 LAB — GLUCOSE, CAPILLARY: Glucose-Capillary: 100 mg/dL — ABNORMAL HIGH (ref 65–99)

## 2017-09-20 MED ORDER — CEPHALEXIN 500 MG PO CAPS
500.0000 mg | ORAL_CAPSULE | Freq: Once | ORAL | Status: AC
  Administered 2017-09-20: 500 mg via ORAL
  Filled 2017-09-20: qty 1

## 2017-09-20 MED ORDER — SODIUM CHLORIDE 0.9 % IV BOLUS (SEPSIS)
1000.0000 mL | Freq: Once | INTRAVENOUS | Status: AC
  Administered 2017-09-20: 1000 mL via INTRAVENOUS

## 2017-09-20 MED ORDER — CEPHALEXIN 500 MG PO CAPS: 500.0000 mg | ORAL_CAPSULE | Freq: Two times a day (BID) | ORAL | 0 refills | Status: AC

## 2017-09-20 NOTE — ED Provider Notes (Addendum)
[facility] Emergency Department Provider Note  ____________________________________________   First MD Initiated Contact with Patient 09/20/17 [phone]     (approximate)  I have reviewed the triage vital signs and the nursing notes.   HISTORY  Chief Complaint Weakness   HPI Samantha Franco is a 83 y.o. female with a history of COPD and diabetes who is presenting to the emergency department today with weakness.  She says that she woke up feeling weak all over with some mild lightheadedness.  She denies any nausea or vomiting.  Denies any diarrhea.  Says that she is concerned that she may not be drinking enough water lately.  Denying any pain.  Denies any dysuria.  Similar symptoms during her last ED visit this past March.  At that time she was given a liter of fluids and had reassuring imaging as well as blood work and was discharged home.    Past Medical History:  Diagnosis Date  . Arthritis   . Chorea   . COPD (chronic obstructive pulmonary disease) ([place])   . Diabetes mellitus without complication ([place])   . Ear infection   . Environmental allergies   . GERD (gastroesophageal reflux disease)   . Hx of rheumatic fever   . Hx: UTI (urinary tract infection)   . Hyperlipidemia   . Hyperlipidemia   . Hypertension   . Hypothyroidism   . Kidney disease   . Lung cancer ([place])   . Mini stroke ([place])   . Stroke ([place])    x's 2  . Vertigo     Patient Active Problem List   Diagnosis Date Noted  . Pain in the chest 03/21/2015  . Essential hypertension 03/21/2015    Past Surgical History:  Procedure Laterality Date  . CARDIAC CATHETERIZATION     [facility]  . CATARACT EXTRACTION    . LUNG LOBECTOMY    . THYROID SURGERY      Prior to Admission medications   Medication Sig Start Date End Date Taking? Authorizing Provider  allopurinol (ZYLOPRIM) 100 MG tablet Take 100-200 mg by mouth daily.    [provider]  amLODipine (NORVASC) 5 MG tablet Take  5 mg by mouth daily.    [provider]  aspirin 325 MG tablet Take 325 mg by mouth daily.    [provider]  bisoprolol-hydrochlorothiazide (ZIAC) 5-6.25 MG per tablet Take 1 tablet by mouth daily.    [provider]  citalopram (CELEXA) 20 MG tablet Take 20 mg by mouth daily.    [provider]  docusate sodium (COLACE) 100 MG capsule Take 100 mg by mouth daily.    [provider]  levothyroxine (SYNTHROID, LEVOTHROID) 75 MCG tablet Take 75 mcg by mouth daily. 12/26/16   [provider]  meloxicam (MOBIC) 7.5 MG tablet Take 7.5 mg by mouth daily.    [provider]  NOVOLOG MIX 70/30 FLEXPEN (70-30) 100 UNIT/ML FlexPen Inject 8 Units into the skin at bedtime. 11/22/16   [provider]  zolpidem (AMBIEN) 5 MG tablet Take 5 mg by mouth at bedtime.     [provider]    Allergies Sulfa antibiotics; Vasotec [enalapril]; and Prednisone  Family History  Problem Relation Age of Onset  . Heart attack Father   . Heart disease Father     Social History Social History   Tobacco Use  . Smoking status: Never Smoker  . Smokeless tobacco: Never Used  Substance Use Topics  . Alcohol use:  No  . Drug use: No    Review of Systems  Constitutional: No fever/chills Eyes: No visual changes. ENT: No sore throat. Cardiovascular: Denies chest pain. Respiratory: Denies shortness of breath. Gastrointestinal: No abdominal pain.  No nausea, no vomiting.  No diarrhea.  No constipation. Genitourinary: Negative for dysuria. Musculoskeletal: Negative for back pain. Skin: Negative for rash. Neurological: Negative for headaches, focal weakness or numbness.   ____________________________________________   PHYSICAL EXAM:  VITAL SIGNS: ED Triage Vitals [09/20/17 0723]  Enc Vitals Group     BP (!) 159/52     Pulse Rate 67     Resp 16     Temp 97.9 F (36.6 C)     Temp Source Oral     SpO2 100 %     Weight       Height      Head Circumference      Peak Flow      Pain Score      Pain Loc      Pain Edu?      Excl. in [place]?     Constitutional: Alert and oriented. Well appearing and in no acute distress. Eyes: Conjunctivae are normal.  Head: Atraumatic. Nose: No congestion/rhinnorhea. Mouth/Throat: Mucous membranes are moist.  Neck: No stridor.   Cardiovascular: Normal rate, regular rhythm. Grossly normal heart sounds.   Respiratory: Normal respiratory effort.  No retractions. Lungs CTAB. Gastrointestinal: Soft and nontender. No distention. No CVA tenderness. Musculoskeletal: No lower extremity tenderness nor edema.  No joint effusions. Neurologic:  Normal speech and language. No gross focal neurologic deficits are appreciated. Skin:  Skin is warm, dry and intact. No rash noted. Psychiatric: Mood and affect are normal. Speech and behavior are normal.  ____________________________________________   LABS (all labs ordered are listed, but only abnormal results are displayed)  Labs Reviewed - No data to display ____________________________________________  EKG  ED ECG REPORT I, Schaevitz,  Youlanda Roys, the attending physician, personally viewed and interpreted this ECG.   Date: 09/20/2017  EKG Time: 0729  Rate: 62  Rhythm: normal sinus rhythm  Axis: Normal  Intervals:none  ST&T Change: No ST segment elevation or depression.  No abnormal T wave inversion.  ____________________________________________  RADIOLOGY   ____________________________________________   PROCEDURES  Procedure(s) performed:   Procedures  Critical Care performed:   ____________________________________________   INITIAL IMPRESSION / ASSESSMENT AND PLAN / ED COURSE  Pertinent labs & imaging results that were available during my care of the patient were reviewed by me and considered in my medical decision making (see chart for details).  Differential diagnosis includes, but is not limited to, alcohol,  illicit or prescription medications, or other toxic ingestion; intracranial pathology such as stroke or intracerebral hemorrhage; fever or infectious causes including sepsis; hypoxemia and/or hypercarbia; uremia; trauma; endocrine related disorders such as diabetes, hypoglycemia, and thyroid-related diseases; hypertensive encephalopathy; etc.  As part of my medical decision making, I reviewed the following data within the electronic MEDICAL RECORD NUMBER Notes from prior ED visits  ----------------------------------------- 8:47 AM on 09/20/2017 -----------------------------------------  Patient feeling much improved after 1 L of fluids.  Labs do indicate what appears to be a mild prerenal pattern.  Possible also UTI versus contamination.  Patient to receive a course of Keflex.  Will send culture.  Patient will be discharged at this time.     ____________________________________________   FINAL CLINICAL IMPRESSION(S) / ED DIAGNOSES  UTI.  Dehydration.    NEW MEDICATIONS STARTED DURING THIS VISIT:  This  SmartLink is deprecated. Use AVSMEDLIST instead to display the medication list for a patient.   Note:  This document was prepared using Dragon voice recognition software and may include unintentional dictation errors.     Schaevitz, Randall An, MD 09/20/17 [phone]  Patient became shaky after discharge.  Repeat blood glucose is 100.  Patient given some juice that she has not had anything to eat or drink this morning.  Son is now at the bedside and had concerned that the patient may have given herself too much insulin this morning.  However, the patient has maintained her blood glucose over the past several hours without anything by mouth.  We will observe the patient briefly and if she improves and feels better we will continue with the plan to discharge to home.    Orbie Pyo, MD 09/20/17 [phone]  I had a lengthy discussion with the patient's son and granddaughter.  The son  says the patient has shown some signs of decline in cognitive function over the past several months.  He says that she is also been very agitated over the past several weeks.  The patient has home health for 2 hours a day but the family is moving to getting 24-hour home health.  Patient is tolerating juice at this time.  Says that when she gets up she is feeling dizzy and weak.  However, was able to ambulate to the door and back to her bed.  I discussed with the family doing further testing versus observation at home and make sure the patient is hydrated.  The family would prefer to take the patient home.  The son will be staying with her today and make sure the patient is eating and drinking and resting properly.  The patient will be brought back to the hospital for any worsening or concerning symptoms.  No focal neurological deficits.    Orbie Pyo, MD 09/20/17 1000

## 2017-09-20 NOTE — ED Notes (Addendum)
MD talking to family and discussing plan

## 2017-09-20 NOTE — ED Triage Notes (Signed)
Pt came to ED via EMS from home c/o "not feeling right" Reports head feels "woozy." Denies fall, injury or any medication changes. Vs stable at this time.

## 2017-09-20 NOTE — ED Notes (Signed)
Went to discharge patient and pt reports she is still feeling shaking and not sure if she should go home. MD notified.

## 2017-09-21 LAB — URINE CULTURE: Culture: <10000 — AB

## 2017-09-23 DIAGNOSIS — E1165 Type 2 diabetes mellitus with hyperglycemia: Secondary | ICD-10-CM | POA: Diagnosis not present

## 2017-09-23 DIAGNOSIS — I10 Essential (primary) hypertension: Secondary | ICD-10-CM | POA: Diagnosis not present

## 2017-09-23 DIAGNOSIS — F0390 Unspecified dementia without behavioral disturbance: Secondary | ICD-10-CM | POA: Diagnosis not present

## 2017-09-23 DIAGNOSIS — F331 Major depressive disorder, recurrent, moderate: Secondary | ICD-10-CM | POA: Diagnosis not present

## 2017-09-23 DIAGNOSIS — E039 Hypothyroidism, unspecified: Secondary | ICD-10-CM | POA: Diagnosis not present

## 2017-09-23 DIAGNOSIS — E860 Dehydration: Secondary | ICD-10-CM | POA: Diagnosis not present

## 2017-09-23 DIAGNOSIS — N390 Urinary tract infection, site not specified: Secondary | ICD-10-CM | POA: Diagnosis not present

## 2017-09-30 DIAGNOSIS — M542 Cervicalgia: Secondary | ICD-10-CM | POA: Diagnosis not present

## 2017-10-06 ENCOUNTER — Encounter: Payer: Self-pay | Admitting: Intensive Care

## 2017-10-06 ENCOUNTER — Emergency Department
Admission: EM | Admit: 2017-10-06 | Discharge: 2017-10-06 | Disposition: A | Discharge status: Home / Self Care | Payer: Medicare Other | Attending: Emergency Medicine | Admitting: Emergency Medicine

## 2017-10-06 DIAGNOSIS — E119 Type 2 diabetes mellitus without complications: Secondary | ICD-10-CM | POA: Insufficient documentation

## 2017-10-06 DIAGNOSIS — Z7982 Long term (current) use of aspirin: Secondary | ICD-10-CM | POA: Diagnosis not present

## 2017-10-06 DIAGNOSIS — Z79899 Other long term (current) drug therapy: Secondary | ICD-10-CM | POA: Insufficient documentation

## 2017-10-06 DIAGNOSIS — L299 Pruritus, unspecified: Secondary | ICD-10-CM | POA: Insufficient documentation

## 2017-10-06 DIAGNOSIS — J449 Chronic obstructive pulmonary disease, unspecified: Secondary | ICD-10-CM | POA: Insufficient documentation

## 2017-10-06 DIAGNOSIS — I10 Essential (primary) hypertension: Secondary | ICD-10-CM | POA: Diagnosis not present

## 2017-10-06 DIAGNOSIS — Z87891 Personal history of nicotine dependence: Secondary | ICD-10-CM | POA: Insufficient documentation

## 2017-10-06 DIAGNOSIS — T7840XA Allergy, unspecified, initial encounter: Secondary | ICD-10-CM | POA: Insufficient documentation

## 2017-10-06 DIAGNOSIS — Z8673 Personal history of transient ischemic attack (TIA), and cerebral infarction without residual deficits: Secondary | ICD-10-CM | POA: Insufficient documentation

## 2017-10-06 DIAGNOSIS — Z85118 Personal history of other malignant neoplasm of bronchus and lung: Secondary | ICD-10-CM | POA: Diagnosis not present

## 2017-10-06 DIAGNOSIS — R220 Localized swelling, mass and lump, head: Secondary | ICD-10-CM | POA: Diagnosis present

## 2017-10-06 DIAGNOSIS — E039 Hypothyroidism, unspecified: Secondary | ICD-10-CM | POA: Diagnosis not present

## 2017-10-06 LAB — GLUCOSE, CAPILLARY: Glucose-Capillary: 151 mg/dL — ABNORMAL HIGH (ref 65–99)

## 2017-10-06 MED ORDER — DIPHENHYDRAMINE HCL 25 MG PO CAPS: 25.0000 mg | ORAL_CAPSULE | ORAL | 0 refills | Status: DC | PRN

## 2017-10-06 MED ORDER — EPINEPHRINE 0.3 MG/0.3ML IJ SOAJ: 0.3000 mg | Freq: Once | INTRAMUSCULAR | 0 refills | Status: AC

## 2017-10-06 MED ORDER — FAMOTIDINE 40 MG PO TABS: 40.0000 mg | ORAL_TABLET | Freq: Every day | ORAL | 0 refills | Status: DC

## 2017-10-06 MED ORDER — FAMOTIDINE IN NACL 20-0.9 MG/50ML-% IV SOLN
INTRAVENOUS | Status: AC
  Administered 2017-10-06: 20 mg via INTRAVENOUS
  Filled 2017-10-06: qty 50

## 2017-10-06 MED ORDER — DIPHENHYDRAMINE HCL 50 MG/ML IJ SOLN
INTRAMUSCULAR | Status: AC
  Administered 2017-10-06: 25 mg via INTRAVENOUS
  Filled 2017-10-06: qty 1

## 2017-10-06 MED ORDER — METHYLPREDNISOLONE SODIUM SUCC 125 MG IJ SOLR
INTRAMUSCULAR | Status: AC
  Filled 2017-10-06: qty 2

## 2017-10-06 MED ORDER — FAMOTIDINE IN NACL 20-0.9 MG/50ML-% IV SOLN
20.0000 mg | Freq: Once | INTRAVENOUS | Status: AC
  Administered 2017-10-06: 20 mg via INTRAVENOUS

## 2017-10-06 MED ORDER — DIPHENHYDRAMINE HCL 50 MG/ML IJ SOLN
25.0000 mg | Freq: Once | INTRAMUSCULAR | Status: AC
  Administered 2017-10-06: 25 mg via INTRAVENOUS

## 2017-10-06 NOTE — ED Provider Notes (Signed)
[facility] Emergency Department Provider Note   ____________________________________________    I have reviewed the triage vital signs and the nursing notes.   HISTORY  Chief Complaint Allergic Reaction     HPI Samantha Franco is a 83 y.o. female who presents with apparent allergic reaction.  Patient reports she was eating a sweet potato casserole with nuts in it and soon afterwards she developed a strange sensation in her face which then apparently started to swell, she then developed diffuse itching all over.  She denies difficult he breathing.  She had some mild dizziness which has improved.  No history of nut allergy in the past.   Past Medical History:  Diagnosis Date  . Arthritis   . Chorea   . COPD (chronic obstructive pulmonary disease) ([place])   . Diabetes mellitus without complication ([place])   . Ear infection   . Environmental allergies   . GERD (gastroesophageal reflux disease)   . Hx of rheumatic fever   . Hx: UTI (urinary tract infection)   . Hyperlipidemia   . Hyperlipidemia   . Hypertension   . Hypothyroidism   . Kidney disease   . Lung cancer ([place])   . Mini stroke ([place])   . Stroke ([place])    x's 2  . Vertigo     Patient Active Problem List   Diagnosis Date Noted  . Pain in the chest 03/21/2015  . Essential hypertension 03/21/2015    Past Surgical History:  Procedure Laterality Date  . CARDIAC CATHETERIZATION     [facility]  . CATARACT EXTRACTION    . LUNG LOBECTOMY    . THYROID SURGERY      Prior to Admission medications   Medication Sig Start Date End Date Taking? Authorizing Provider  amLODipine (NORVASC) 5 MG tablet Take 5 mg by mouth daily.   Yes [provider]  aspirin 325 MG tablet Take 325 mg by mouth daily.   Yes [provider]  bisoprolol-hydrochlorothiazide (ZIAC) 5-6.25 MG per tablet Take 1 tablet by mouth daily.   Yes [provider]  citalopram (CELEXA) 20 MG tablet Take 20 mg  by mouth daily.   Yes [provider]  docusate sodium (COLACE) 100 MG capsule Take 100 mg by mouth daily.   Yes [provider]  LEVEMIR FLEXTOUCH 100 UNIT/ML Pen Inject 15 Units into the skin daily. 09/20/17  Yes [provider]  levothyroxine (SYNTHROID, LEVOTHROID) 75 MCG tablet Take 75 mcg by mouth daily. 12/26/16  Yes [provider]  memantine (NAMENDA) 5 MG tablet Take 1 tablet by mouth 2 (two) times daily. 09/23/17  Yes [provider]  zolpidem (AMBIEN) 5 MG tablet Take 5 mg by mouth at bedtime.    Yes [provider]  allopurinol (ZYLOPRIM) 100 MG tablet Take 150 mg by mouth daily.     [provider]     Allergies Sulfa antibiotics; Vasotec [enalapril]; and Prednisone  Family History  Problem Relation Age of Onset  . Heart attack Father   . Heart disease Father     Social History Social History   Tobacco Use  . Smoking status: Former Smoker    Types: Cigarettes  . Smokeless tobacco: Never Used  . Tobacco comment: Quit X30 years ago  Substance Use Topics  . Alcohol use: No  . Drug use: No    Review of Systems  Constitutional: No fever/chills Eyes: No visual changes.  ENT: No sore throat. Cardiovascular: Denies chest  pain. Respiratory: Denies shortness of breath. Gastrointestinal: No abdominal pain.  No nausea, no vomiting.   Genitourinary: Negative for dysuria. Musculoskeletal: Negative for back pain. Skin: Itchy skin Neurological: Negative for headaches    ____________________________________________   PHYSICAL EXAM:  VITAL SIGNS: ED Triage Vitals [10/06/17 1336]  Enc Vitals Group     BP (!) 152/47     Pulse Rate 64     Resp 16     Temp 97.8 F (36.6 C)     Temp Source Oral     SpO2 98 %     Weight 63.5 kg (140 lb)     Height 1.575 m (5\' 2" )     Head Circumference      Peak Flow      Pain Score      Pain Loc      Pain Edu?      Excl. in [place]?     Constitutional: Alert and  oriented. No acute distress. Pleasant and interactive Eyes: Conjunctivae are normal.  . Nose: No congestion/rhinnorhea. Mouth/Throat: Mucous membranes are moist.  Pharynx is normal, some puffiness around the lips Neck:  Painless ROM Cardiovascular: Normal rate, regular rhythm. Grossly normal heart sounds.  Good peripheral circulation. Respiratory: Normal respiratory effort.  No retractions. Lungs CTAB. Gastrointestinal: Soft and nontender. No distention.  No CVA tenderness. Genitourinary: deferred Musculoskeletal:   Warm and well perfused Neurologic:  Normal speech and language. No gross focal neurologic deficits are appreciated.  Skin:  Skin is warm, dry and intact. No rash noted. Psychiatric: Mood and affect are normal. Speech and behavior are normal.  ____________________________________________   LABS (all labs ordered are listed, but only abnormal results are displayed)  Labs Reviewed - No data to display ____________________________________________  EKG  None ____________________________________________  RADIOLOGY  None ____________________________________________   PROCEDURES  Procedure(s) performed: No  Procedures   Critical Care performed: No ____________________________________________   INITIAL IMPRESSION / ASSESSMENT AND PLAN / ED COURSE  Pertinent labs & imaging results that were available during my care of the patient were reviewed by me and considered in my medical decision making (see chart for details).  Patient presents with acute onset of facial swelling and diffuse pruritus.  Questionable allergy to prednisone, given IV Pepcid and IV Benadryl with significant improvement.  We will continue to monitor in the emergency department to ensure continued improvement    ____________________________________________   FINAL CLINICAL IMPRESSION(S) / ED DIAGNOSES  Final diagnoses:  Allergic reaction, initial encounter        Note:  This  document was prepared using Dragon voice recognition software and may include unintentional dictation errors.    Lavonia Drafts, MD 10/06/17 [phone]

## 2017-10-06 NOTE — Discharge Instructions (Addendum)
We do not know the cause of your allergic reaction today, or if your swelling even was an allergic reaction at all.  However, please take all precautions and do not take your Namenda until you talk to your primary care doctor, I do not eat any foods with pecans in them until you have seeing your primary care physician.  Please follow-up with your primary care doctor tomorrow, and bring all your medications with you as they can be reviewed.  Please also bring all the ingredients from the casserole that you ate.  Return to the emergency department if you develop severe pain, difficulty swallowing, difficulty breathing, lightheadedness or fainting, or any other symptoms concerning to you.  Please read all the instructions about EpiPen usage.

## 2017-10-06 NOTE — ED Notes (Signed)
Collected lav top, light green top tubes and sent to lab pending orders.

## 2017-10-06 NOTE — ED Provider Notes (Signed)
This patient was signed out to me by Dr. Edgar Frisk.  83 year old female who had the acute onset of facial swelling today after eating a squash and pecan casserole.  Overall, the patient continues to be hemodynamically stable, and I have reevaluated her at this time.  Her facial swelling has significantly improved and both she and her entire family agree that she is looking much better and more comfortable at this time.  The patient has an allergy to prednisone, so we will discharge her home with scheduled Pepcid and Benadryl as needed.  She will also go home with an EpiPen and instructions on when to use this, which I have reviewed with her and the family.  She has a new medication, Namenda, which she will hold until she speaks to her primary care physician although the likelihood about allergy to this is less due to being on this medicine for 1 month.  I personally reviewed her medication list which she keeps in her purse, and I do not see any ACE inhibitors listed.  At this time, the patient is safe for discharge and understands return precautions as well as follow-up instructions.   Eula Listen, MD 10/06/17 [phone]

## 2017-10-06 NOTE — ED Triage Notes (Addendum)
Patient is here for allergic reaction to nuts in July 2018. Patient had pecans today and now her face is swelling, blurry vision and experiencing itching all over. Denies trouble breathing/swallowing. HX dementia. In triage patient is A&O x3 to situation, place, and person.

## 2017-10-07 DIAGNOSIS — T7840XA Allergy, unspecified, initial encounter: Secondary | ICD-10-CM | POA: Diagnosis not present

## 2017-10-07 DIAGNOSIS — I10 Essential (primary) hypertension: Secondary | ICD-10-CM | POA: Diagnosis not present

## 2017-10-07 DIAGNOSIS — N390 Urinary tract infection, site not specified: Secondary | ICD-10-CM | POA: Diagnosis not present

## 2017-10-15 DIAGNOSIS — M542 Cervicalgia: Secondary | ICD-10-CM | POA: Diagnosis not present

## 2017-10-23 ENCOUNTER — Ambulatory Visit: Payer: Self-pay | Admitting: Internal Medicine

## 2017-10-24 ENCOUNTER — Ambulatory Visit: Payer: Self-pay | Admitting: Internal Medicine

## 2017-11-03 ENCOUNTER — Other Ambulatory Visit: Payer: Self-pay | Admitting: Internal Medicine

## 2017-11-03 MED ORDER — CIPROFLOXACIN HCL 500 MG PO TABS: ORAL_TABLET | ORAL | 0 refills | Status: DC

## 2017-11-04 DIAGNOSIS — R509 Fever, unspecified: Secondary | ICD-10-CM | POA: Diagnosis not present

## 2017-11-04 DIAGNOSIS — N189 Chronic kidney disease, unspecified: Secondary | ICD-10-CM | POA: Diagnosis not present

## 2017-11-04 DIAGNOSIS — N3000 Acute cystitis without hematuria: Secondary | ICD-10-CM | POA: Diagnosis not present

## 2017-11-04 DIAGNOSIS — W19XXXA Unspecified fall, initial encounter: Secondary | ICD-10-CM | POA: Diagnosis not present

## 2017-11-04 DIAGNOSIS — I129 Hypertensive chronic kidney disease with stage 1 through stage 4 chronic kidney disease, or unspecified chronic kidney disease: Secondary | ICD-10-CM | POA: Diagnosis not present

## 2017-11-04 DIAGNOSIS — R531 Weakness: Secondary | ICD-10-CM | POA: Diagnosis not present

## 2017-11-04 DIAGNOSIS — I498 Other specified cardiac arrhythmias: Secondary | ICD-10-CM | POA: Diagnosis not present

## 2017-11-04 DIAGNOSIS — N390 Urinary tract infection, site not specified: Secondary | ICD-10-CM | POA: Diagnosis not present

## 2017-11-04 DIAGNOSIS — I4581 Long QT syndrome: Secondary | ICD-10-CM | POA: Diagnosis not present

## 2017-11-04 DIAGNOSIS — R350 Frequency of micturition: Secondary | ICD-10-CM | POA: Diagnosis not present

## 2017-11-04 DIAGNOSIS — M25559 Pain in unspecified hip: Secondary | ICD-10-CM | POA: Diagnosis not present

## 2017-11-04 DIAGNOSIS — E1122 Type 2 diabetes mellitus with diabetic chronic kidney disease: Secondary | ICD-10-CM | POA: Diagnosis not present

## 2017-11-04 DIAGNOSIS — M25551 Pain in right hip: Secondary | ICD-10-CM | POA: Diagnosis not present

## 2017-11-06 DIAGNOSIS — Z8673 Personal history of transient ischemic attack (TIA), and cerebral infarction without residual deficits: Secondary | ICD-10-CM | POA: Diagnosis not present

## 2017-11-06 DIAGNOSIS — R5381 Other malaise: Secondary | ICD-10-CM | POA: Diagnosis not present

## 2017-11-06 DIAGNOSIS — I498 Other specified cardiac arrhythmias: Secondary | ICD-10-CM | POA: Diagnosis not present

## 2017-11-06 DIAGNOSIS — R109 Unspecified abdominal pain: Secondary | ICD-10-CM | POA: Diagnosis not present

## 2017-11-06 DIAGNOSIS — Z79899 Other long term (current) drug therapy: Secondary | ICD-10-CM | POA: Diagnosis not present

## 2017-11-06 DIAGNOSIS — M8580 Other specified disorders of bone density and structure, unspecified site: Secondary | ICD-10-CM | POA: Diagnosis not present

## 2017-11-06 DIAGNOSIS — R531 Weakness: Secondary | ICD-10-CM | POA: Diagnosis not present

## 2017-11-06 DIAGNOSIS — M545 Low back pain: Secondary | ICD-10-CM | POA: Diagnosis not present

## 2017-11-06 DIAGNOSIS — R5383 Other fatigue: Secondary | ICD-10-CM | POA: Diagnosis not present

## 2017-11-06 DIAGNOSIS — M25551 Pain in right hip: Secondary | ICD-10-CM | POA: Diagnosis not present

## 2017-11-06 DIAGNOSIS — Z87891 Personal history of nicotine dependence: Secondary | ICD-10-CM | POA: Diagnosis not present

## 2017-11-06 DIAGNOSIS — Z85118 Personal history of other malignant neoplasm of bronchus and lung: Secondary | ICD-10-CM | POA: Diagnosis not present

## 2017-11-06 DIAGNOSIS — N390 Urinary tract infection, site not specified: Secondary | ICD-10-CM | POA: Diagnosis not present

## 2017-11-06 DIAGNOSIS — R509 Fever, unspecified: Secondary | ICD-10-CM | POA: Diagnosis not present

## 2017-11-06 DIAGNOSIS — R300 Dysuria: Secondary | ICD-10-CM | POA: Diagnosis not present

## 2017-11-14 ENCOUNTER — Ambulatory Visit: Payer: Self-pay | Admitting: Internal Medicine

## 2017-11-18 ENCOUNTER — Encounter: Payer: Self-pay | Admitting: Emergency Medicine

## 2017-11-18 ENCOUNTER — Emergency Department
Admission: EM | Admit: 2017-11-18 | Discharge: 2017-11-18 | Disposition: A | Discharge status: Home / Self Care | Payer: Medicare Other | Attending: Emergency Medicine | Admitting: Emergency Medicine

## 2017-11-18 ENCOUNTER — Emergency Department: Payer: Medicare Other

## 2017-11-18 DIAGNOSIS — Z794 Long term (current) use of insulin: Secondary | ICD-10-CM | POA: Diagnosis not present

## 2017-11-18 DIAGNOSIS — J449 Chronic obstructive pulmonary disease, unspecified: Secondary | ICD-10-CM | POA: Insufficient documentation

## 2017-11-18 DIAGNOSIS — R531 Weakness: Secondary | ICD-10-CM

## 2017-11-18 DIAGNOSIS — E039 Hypothyroidism, unspecified: Secondary | ICD-10-CM | POA: Diagnosis not present

## 2017-11-18 DIAGNOSIS — I10 Essential (primary) hypertension: Secondary | ICD-10-CM | POA: Insufficient documentation

## 2017-11-18 DIAGNOSIS — Z8673 Personal history of transient ischemic attack (TIA), and cerebral infarction without residual deficits: Secondary | ICD-10-CM | POA: Diagnosis not present

## 2017-11-18 DIAGNOSIS — Z85118 Personal history of other malignant neoplasm of bronchus and lung: Secondary | ICD-10-CM | POA: Diagnosis not present

## 2017-11-18 DIAGNOSIS — R404 Transient alteration of awareness: Secondary | ICD-10-CM | POA: Diagnosis not present

## 2017-11-18 DIAGNOSIS — Z79899 Other long term (current) drug therapy: Secondary | ICD-10-CM | POA: Insufficient documentation

## 2017-11-18 DIAGNOSIS — R5381 Other malaise: Secondary | ICD-10-CM | POA: Diagnosis not present

## 2017-11-18 DIAGNOSIS — E119 Type 2 diabetes mellitus without complications: Secondary | ICD-10-CM | POA: Diagnosis not present

## 2017-11-18 LAB — COMPREHENSIVE METABOLIC PANEL
ALBUMIN: 3.9 g/dL (ref 3.5–5.0)
ALT: 10 U/L — AB (ref 14–54)
AST: 25 U/L (ref 15–41)
Alkaline Phosphatase: 51 U/L (ref 38–126)
Anion gap: 10 (ref 5–15)
BUN: 22 mg/dL — AB (ref 6–20)
CHLORIDE: 105 mmol/L (ref 101–111)
CO2: 26 mmol/L (ref 22–32)
CREATININE: 1.17 mg/dL — AB (ref 0.44–1.00)
Calcium: 9.3 mg/dL (ref 8.9–10.3)
GFR calc Af Amer: 47 mL/min — ABNORMAL LOW (ref >60)
GFR, EST NON AFRICAN AMERICAN: 41 mL/min — AB (ref >60)
GLUCOSE: 111 mg/dL — AB (ref 65–99)
Potassium: 3.9 mmol/L (ref 3.5–5.1)
Sodium: 141 mmol/L (ref 135–145)
Total Bilirubin: 0.7 mg/dL (ref 0.3–1.2)
Total Protein: 7.1 g/dL (ref 6.5–8.1)

## 2017-11-18 LAB — URINALYSIS, COMPLETE (UACMP) WITH MICROSCOPIC
BACTERIA UA: NONE SEEN
Bilirubin Urine: NEGATIVE
GLUCOSE, UA: NEGATIVE mg/dL
Ketones, ur: NEGATIVE mg/dL
Leukocytes, UA: NEGATIVE
Nitrite: NEGATIVE
PH: 5 (ref 5.0–8.0)
Protein, ur: NEGATIVE mg/dL
Specific Gravity, Urine: 1.009 (ref 1.005–1.030)

## 2017-11-18 LAB — CBC WITH DIFFERENTIAL/PLATELET
BASOS ABS: 0.1 10*3/uL (ref 0–0.1)
Basophils Relative: 1 %
Eosinophils Absolute: 0.1 10*3/uL (ref 0–0.7)
Eosinophils Relative: 1 %
HEMATOCRIT: 37.3 % (ref 35.0–47.0)
Hemoglobin: 11.9 g/dL — ABNORMAL LOW (ref 12.0–16.0)
LYMPHS ABS: 1.2 10*3/uL (ref 1.0–3.6)
LYMPHS PCT: 12 %
MCH: 25.3 pg — AB (ref 26.0–34.0)
MCHC: 31.9 g/dL — AB (ref 32.0–36.0)
MCV: 79.3 fL — AB (ref 80.0–100.0)
MONO ABS: 0.6 10*3/uL (ref 0.2–0.9)
MONOS PCT: 6 %
Neutro Abs: 8.7 10*3/uL — ABNORMAL HIGH (ref 1.4–6.5)
Neutrophils Relative %: 80 %
Platelets: 289 10*3/uL (ref 150–440)
RBC: 4.7 MIL/uL (ref 3.80–5.20)
RDW: 17.4 % — AB (ref 11.5–14.5)
WBC: 10.7 10*3/uL (ref 3.6–11.0)

## 2017-11-18 LAB — TROPONIN I

## 2017-11-18 MED ORDER — SODIUM CHLORIDE 0.9 % IV BOLUS (SEPSIS)
500.0000 mL | Freq: Once | INTRAVENOUS | Status: AC
  Administered 2017-11-18: 500 mL via INTRAVENOUS

## 2017-11-18 MED ORDER — ACETAMINOPHEN 500 MG PO TABS
1000.0000 mg | ORAL_TABLET | Freq: Once | ORAL | Status: AC
  Administered 2017-11-18: 1000 mg via ORAL
  Filled 2017-11-18: qty 2

## 2017-11-18 NOTE — ED Provider Notes (Signed)
[facility] Emergency Department Provider Note  ____________________________________________   First MD Initiated Contact with Patient 11/18/17 1256     (approximate)  I have reviewed the triage vital signs and the nursing notes.   HISTORY  Chief Complaint Weakness   HPI Samantha Franco is a 84 y.o. female who comes the emergency department by EMS with 2-3 days of generalized malaise.  She feels like she has decreased energy.  She denies fevers or chills.  She denies chest pain or shortness of breath.  She denies cough.  She denies flulike symptoms.  She denies dysuria frequency or hesitancy although she does report frequent urinary incontinence.  She does report some mild severity chronic throbbing aching pain in her right hip.  She has not fallen recently.  Her symptoms had insidious onset has been constant ever since.  Nothing seems to make them better or worse.  She feels generally "tired".  She did get a flu shot this year.  Past Medical History:  Diagnosis Date  . Arthritis   . Chorea   . COPD (chronic obstructive pulmonary disease) ([place])   . Diabetes mellitus without complication ([place])   . Ear infection   . Environmental allergies   . GERD (gastroesophageal reflux disease)   . Hx of rheumatic fever   . Hx: UTI (urinary tract infection)   . Hyperlipidemia   . Hyperlipidemia   . Hypertension   . Hypothyroidism   . Kidney disease   . Lung cancer ([place])   . Mini stroke ([place])   . Stroke ([place])    x's 2  . Vertigo     Patient Active Problem List   Diagnosis Date Noted  . Pain in the chest 03/21/2015  . Essential hypertension 03/21/2015    Past Surgical History:  Procedure Laterality Date  . CARDIAC CATHETERIZATION     [facility]  . CATARACT EXTRACTION    . LUNG LOBECTOMY    . THYROID SURGERY      Prior to Admission medications   Medication Sig Start Date End Date Taking? Authorizing Provider  allopurinol (ZYLOPRIM) 100 MG tablet Take 150  mg by mouth daily.     [provider]  amLODipine (NORVASC) 5 MG tablet Take 5 mg by mouth daily.    [provider]  aspirin 325 MG tablet Take 325 mg by mouth daily.    [provider]  bisoprolol-hydrochlorothiazide (ZIAC) 5-6.25 MG per tablet Take 1 tablet by mouth daily.    [provider]  ciprofloxacin (CIPRO) 500 MG tablet TAKE 1 TABLET BY MOUTH TWICE DAILY FOR 14 DAYS 11/03/17   Ronnell Freshwater, NP  citalopram (CELEXA) 20 MG tablet Take 20 mg by mouth daily.    [provider]  diphenhydrAMINE (BENADRYL) 25 mg capsule Take 1 capsule (25 mg total) by mouth every 4 (four) hours as needed. 10/06/17 10/06/18  Eula Listen, MD  docusate sodium (COLACE) 100 MG capsule Take 100 mg by mouth daily.    [provider]  famotidine (PEPCID) 40 MG tablet Take 1 tablet (40 mg total) by mouth daily. 10/06/17 10/06/18  Eula Listen, MD  LEVEMIR FLEXTOUCH 100 UNIT/ML Pen Inject 15 Units into the skin daily. 09/20/17   [provider]  levothyroxine (SYNTHROID, LEVOTHROID) 75 MCG tablet Take 75 mcg by mouth daily. 12/26/16   [provider]  memantine (NAMENDA) 5 MG tablet Take 1 tablet by mouth 2 (two) times daily. 09/23/17   [provider]  zolpidem (AMBIEN) 5 MG tablet Take 5 mg by mouth at bedtime.     [provider]    Allergies Shellfish allergy; Sulfa antibiotics; Vasotec [enalapril]; and Prednisone  Family History  Problem Relation Age of Onset  . Heart attack Father   . Heart disease Father     Social History Social History   Tobacco Use  . Smoking status: Former Smoker    Types: Cigarettes  . Smokeless tobacco: Never Used  . Tobacco comment: Quit X30 years ago  Substance Use Topics  . Alcohol use: No  . Drug use: No    Review of Systems Constitutional: No fever/chills Eyes: No visual changes. ENT: No sore throat. Cardiovascular: Denies chest pain. Respiratory:  Denies shortness of breath. Gastrointestinal: No abdominal pain.  No nausea, no vomiting.  No diarrhea.  No constipation. Genitourinary: Negative for dysuria. Musculoskeletal: Negative for back pain. Skin: Negative for rash. Neurological: Negative for headaches, focal weakness or numbness.   ____________________________________________   PHYSICAL EXAM:  VITAL SIGNS: ED Triage Vitals [11/18/17 1255]  Enc Vitals Group     BP      Pulse      Resp 16     Temp      Temp Source Oral     SpO2      Weight      Height      Head Circumference      Peak Flow      Pain Score 0     Pain Loc      Pain Edu?      Excl. in [place]?     Constitutional: Alert and oriented x4 well-appearing nontoxic no diaphoresis speaks in full clear sentences Eyes: PERRL EOMI. Head: Atraumatic. Nose: No congestion/rhinnorhea. Mouth/Throat: No trismus uvula midline no pharyngeal erythema or exudate Neck: No stridor.   Cardiovascular: Normal rate, regular rhythm. Grossly normal heart sounds.  Good peripheral circulation. Respiratory: Normal respiratory effort.  No retractions. Lungs CTAB and moving good air Gastrointestinal: Soft nontender Musculoskeletal: No lower extremity edema   Neurologic:  Normal speech and language. No gross focal neurologic deficits are appreciated. Skin:  Skin is warm, dry and intact. No rash noted. Psychiatric: Mood and affect are normal. Speech and behavior are normal.    ____________________________________________   DIFFERENTIAL includes but not limited to  Acute coronary syndrome, urinary tract infection, dehydration, metabolic pneumonia, influenza ____________________________________________   LABS (all labs ordered are listed, but only abnormal results are displayed)  Labs Reviewed  URINALYSIS, COMPLETE (UACMP) WITH MICROSCOPIC - Abnormal; Notable for the following components:      Result Value   Color, Urine STRAW (*)    APPearance CLEAR (*)    Hgb urine  dipstick MODERATE (*)    Squamous Epithelial / LPF 0-5 (*)    All other components within normal limits  COMPREHENSIVE METABOLIC PANEL - Abnormal; Notable for the following components:   Glucose, Bld 111 (*)    BUN 22 (*)    Creatinine, Ser 1.17 (*)    ALT 10 (*)    GFR calc non Af Amer 41 (*)    GFR calc Af Amer 47 (*)    All other components within normal limits  CBC WITH DIFFERENTIAL/PLATELET - Abnormal; Notable for the following components:   Hemoglobin 11.9 (*)    MCV 79.3 (*)    MCH 25.3 (*)    MCHC 31.9 (*)    RDW 17.4 (*)    Neutro Abs 8.7 (*)  All other components within normal limits  TROPONIN I    Lab work reviewed by me with no acute disease __________________________________________  EKG  ED ECG REPORT I, Darel Hong, the attending physician, personally viewed and interpreted this ECG.  Date: 11/18/2017 EKG Time:  Rate: 65 Rhythm: normal sinus rhythm QRS Axis: normal Intervals: normal ST/T Wave abnormalities: normal Narrative Interpretation: no evidence of acute ischemia  ____________________________________________  RADIOLOGY  Chest x-ray reviewed by me with no acute disease ____________________________________________   PROCEDURES  Procedure(s) performed: no  Procedures  Critical Care performed: no  Observation: no ____________________________________________   INITIAL IMPRESSION / ASSESSMENT AND PLAN / ED COURSE  Pertinent labs & imaging results that were available during my care of the patient were reviewed by me and considered in my medical decision making (see chart for details).  Patient arrives quite well-appearing with a normal blood sugar and hemodynamically stable.  Unclear etiology of her generalized malaise but an 84 year old patient differentials extremely broad including acute coronary syndrome as well as urinary tract infection.  Gentle fluids while workup is pending.    After fluids the patient feels remarkably  improved.  I had a lengthy discussion with the patient and her sister at bedside regarding the results and that I felt she was stable for outpatient management.  The patient and her sister both agreed.  Strict return precautions have been given and the patient verbalizes understanding and agreement the plan. ____________________________________________   FINAL CLINICAL IMPRESSION(S) / ED DIAGNOSES  Final diagnoses:  Weakness      NEW MEDICATIONS STARTED DURING THIS VISIT:  This SmartLink is deprecated. Use AVSMEDLIST instead to display the medication list for a patient.   Note:  This document was prepared using Dragon voice recognition software and may include unintentional dictation errors.     Darel Hong, MD 11/18/17 2034

## 2017-11-18 NOTE — ED Triage Notes (Signed)
Pt to ED via EMS from home with c/o weakness xfew days, pt A&O to self . MD at bedside

## 2017-11-18 NOTE — Discharge Instructions (Signed)
Fortunately today your blood work, your urinalysis, your chest x-ray, and your EKG were all very read please make sure you remain well-hydrated and follow-up with your primary care physician within 1 week as needed.  Return to the emergency department sooner for any new or worsening symptoms such as fevers, chills, worsening pain, or for any other issues whatsoever.  It was a pleasure to take care of you today, and thank you for coming to our emergency department.  If you have any questions or concerns before leaving please ask the nurse to grab me and I'm more than happy to go through your aftercare instructions again.  If you were prescribed any opioid pain medication today such as Norco, Vicodin, Percocet, morphine, hydrocodone, or oxycodone please make sure you do not drive when you are taking this medication as it can alter your ability to drive safely.  If you have any concerns once you are home that you are not improving or are in fact getting worse before you can make it to your follow-up appointment, please do not hesitate to call 911 and come back for further evaluation.  Darel Hong, MD  Results for orders placed or performed during the hospital encounter of 11/18/17  Urinalysis, Complete w Microscopic  Result Value Ref Range   Color, Urine STRAW (A) YELLOW   APPearance CLEAR (A) CLEAR   Specific Gravity, Urine 1.009 1.005 - 1.030   pH 5.0 5.0 - 8.0   Glucose, UA NEGATIVE NEGATIVE mg/dL   Hgb urine dipstick MODERATE (A) NEGATIVE   Bilirubin Urine NEGATIVE NEGATIVE   Ketones, ur NEGATIVE NEGATIVE mg/dL   Protein, ur NEGATIVE NEGATIVE mg/dL   Nitrite NEGATIVE NEGATIVE   Leukocytes, UA NEGATIVE NEGATIVE   RBC / HPF 0-5 0 - 5 RBC/hpf   WBC, UA 0-5 0 - 5 WBC/hpf   Bacteria, UA NONE SEEN NONE SEEN   Squamous Epithelial / LPF 0-5 (A) NONE SEEN  Comprehensive metabolic panel  Result Value Ref Range   Sodium 141 135 - 145 mmol/L   Potassium 3.9 3.5 - 5.1 mmol/L   Chloride 105 101  - 111 mmol/L   CO2 26 22 - 32 mmol/L   Glucose, Bld 111 (H) 65 - 99 mg/dL   BUN 22 (H) 6 - 20 mg/dL   Creatinine, Ser 1.17 (H) 0.44 - 1.00 mg/dL   Calcium 9.3 8.9 - 10.3 mg/dL   Total Protein 7.1 6.5 - 8.1 g/dL   Albumin 3.9 3.5 - 5.0 g/dL   AST 25 15 - 41 U/L   ALT 10 (L) 14 - 54 U/L   Alkaline Phosphatase 51 38 - 126 U/L   Total Bilirubin 0.7 0.3 - 1.2 mg/dL   GFR calc non Af Amer 41 (L) >60 mL/min   GFR calc Af Amer 47 (L) >60 mL/min   Anion gap 10 5 - 15  Troponin I  Result Value Ref Range   Troponin I <0.03 <0.03 ng/mL  CBC with Differential  Result Value Ref Range   WBC 10.7 3.6 - 11.0 K/uL   RBC 4.70 3.80 - 5.20 MIL/uL   Hemoglobin 11.9 (L) 12.0 - 16.0 g/dL   HCT 37.3 35.0 - 47.0 %   MCV 79.3 (L) 80.0 - 100.0 fL   MCH 25.3 (L) 26.0 - 34.0 pg   MCHC 31.9 (L) 32.0 - 36.0 g/dL   RDW 17.4 (H) 11.5 - 14.5 %   Platelets 289 150 - 440 K/uL   Neutrophils Relative % 80 %  Neutro Abs 8.7 (H) 1.4 - 6.5 K/uL   Lymphocytes Relative 12 %   Lymphs Abs 1.2 1.0 - 3.6 K/uL   Monocytes Relative 6 %   Monocytes Absolute 0.6 0.2 - 0.9 K/uL   Eosinophils Relative 1 %   Eosinophils Absolute 0.1 0 - 0.7 K/uL   Basophils Relative 1 %   Basophils Absolute 0.1 0 - 0.1 K/uL   Dg Chest Port 1 View  Result Date: 11/18/2017 CLINICAL DATA:  Weakness. EXAM: PORTABLE CHEST 1 VIEW COMPARISON:  Chest x-ray dated February 02, 2017. FINDINGS: The cardiomediastinal silhouette is normal in size. Normal pulmonary vascularity. Postsurgical changes and scarring in the right lung near the hilum are unchanged. No focal consolidation, pleural effusion, or pneumothorax. No acute osseous abnormality. IMPRESSION: No active disease. Electronically Signed   By: Titus Dubin M.D.   On: 11/18/2017 13:21

## 2017-11-18 NOTE — ED Notes (Signed)
Pt in NAD at time of d/c, VS stable. Pt verbalizes d/c understanding and follow up, Pt family at bedside. Pt to wheelchair

## 2017-11-27 ENCOUNTER — Other Ambulatory Visit: Payer: Self-pay

## 2017-11-27 MED ORDER — ALLOPURINOL 100 MG PO TABS: ORAL_TABLET | ORAL | 3 refills | Status: DC

## 2017-11-27 MED ORDER — MEMANTINE HCL 5 MG PO TABS: 5.0000 mg | ORAL_TABLET | Freq: Two times a day (BID) | ORAL | 3 refills | Status: DC

## 2017-12-01 ENCOUNTER — Telehealth: Payer: Self-pay

## 2017-12-01 ENCOUNTER — Other Ambulatory Visit: Payer: Self-pay | Admitting: Nurse Practitioner

## 2017-12-01 DIAGNOSIS — M5386 Other specified dorsopathies, lumbar region: Secondary | ICD-10-CM

## 2017-12-01 MED ORDER — TRAMADOL HCL 50 MG PO TABS: 50.0000 mg | ORAL_TABLET | Freq: Three times a day (TID) | ORAL | 0 refills | Status: DC | PRN

## 2017-12-01 NOTE — Progress Notes (Signed)
Sent in rx for tramadol 50mg  TID prn pain. #45 tablets sent to [address]. Will see Dr. Humphrey Rolls next week and will need order for physical therapy.

## 2017-12-02 NOTE — Telephone Encounter (Signed)
Pt walk in she having pain ih her right hip and goes to her leg and send reminder heather and gave appt next week and and heather send pain med for now /np

## 2017-12-09 ENCOUNTER — Ambulatory Visit: Payer: Self-pay | Admitting: Internal Medicine

## 2017-12-17 ENCOUNTER — Ambulatory Visit (INDEPENDENT_AMBULATORY_CARE_PROVIDER_SITE_OTHER): Payer: Medicare Other | Admitting: Internal Medicine

## 2017-12-17 ENCOUNTER — Encounter: Payer: Self-pay | Admitting: Internal Medicine

## 2017-12-17 VITALS — BP 115/49 | HR 64 | Resp 16 | Ht 63.0 in | Wt 127.4 lb

## 2017-12-17 DIAGNOSIS — M25551 Pain in right hip: Secondary | ICD-10-CM | POA: Diagnosis not present

## 2017-12-17 DIAGNOSIS — M1610 Unilateral primary osteoarthritis, unspecified hip: Secondary | ICD-10-CM | POA: Insufficient documentation

## 2017-12-17 DIAGNOSIS — I10 Essential (primary) hypertension: Secondary | ICD-10-CM | POA: Diagnosis not present

## 2017-12-17 DIAGNOSIS — E1165 Type 2 diabetes mellitus with hyperglycemia: Secondary | ICD-10-CM | POA: Diagnosis not present

## 2017-12-17 DIAGNOSIS — F0280 Dementia in other diseases classified elsewhere without behavioral disturbance: Secondary | ICD-10-CM

## 2017-12-17 DIAGNOSIS — M7070 Other bursitis of hip, unspecified hip: Secondary | ICD-10-CM | POA: Insufficient documentation

## 2017-12-17 DIAGNOSIS — G301 Alzheimer's disease with late onset: Secondary | ICD-10-CM | POA: Diagnosis not present

## 2017-12-17 DIAGNOSIS — M1710 Unilateral primary osteoarthritis, unspecified knee: Secondary | ICD-10-CM | POA: Insufficient documentation

## 2017-12-17 DIAGNOSIS — M179 Osteoarthritis of knee, unspecified: Secondary | ICD-10-CM | POA: Insufficient documentation

## 2017-12-17 DIAGNOSIS — R531 Weakness: Secondary | ICD-10-CM

## 2017-12-17 LAB — POCT GLYCOSYLATED HEMOGLOBIN (HGB A1C): HEMOGLOBIN A1C: 6.1

## 2017-12-17 MED ORDER — MEMANTINE HCL 5 MG PO TABS: 10.0000 mg | ORAL_TABLET | Freq: Two times a day (BID) | ORAL | 12 refills | Status: DC

## 2017-12-17 MED ORDER — DONEPEZIL HCL 5 MG PO TABS: 5.0000 mg | ORAL_TABLET | Freq: Every day | ORAL | 6 refills | Status: DC

## 2017-12-17 NOTE — Progress Notes (Signed)
[facility] [place], [place] [address]  Internal MEDICINE  Office Visit Note  Patient Name: Samantha Franco  [id number]  [id number]  Date of Service: 12/17/2017  Chief Complaint  Patient presents with  . Hospitalization Follow-up    several times in hospital   HPI  Pt is here for routine follow up. C/O memory loss. Pt is here with her son in the room. bp has been on the low side    Mental Health Problem  The primary symptoms include delusions and bizarre behavior.  The degree of incapacity that she is experiencing as a consequence of her illness is mild. Additional symptoms of the illness do not include unexpected weight change, fatigue or abdominal pain. She does not admit to suicidal ideas.  Other  This is a recurrent (Right hip pain ) problem. The current episode started in the past 7 days. The problem occurs daily. The problem has been resolved. Associated symptoms include joint swelling and numbness. Pertinent negatives include no abdominal pain, arthralgias, chest pain, chills, congestion, coughing, fatigue, nausea, neck pain, rash, sore throat or vomiting. The symptoms are aggravated by exertion. The treatment provided mild relief.       Current Medication: Outpatient Encounter Medications as of 12/17/2017  Medication Sig  . allopurinol (ZYLOPRIM) 100 MG tablet Take one and one-half tablets at night  For gout  . ALPRAZolam (XANAX) 0.25 MG tablet Take 0.25 mg by mouth at bedtime as needed for anxiety.  Marland Kitchen amLODipine (NORVASC) 5 MG tablet Take 5 mg by mouth daily.  Marland Kitchen aspirin 325 MG tablet Take 325 mg by mouth daily.  . bisoprolol-hydrochlorothiazide (ZIAC) 5-6.25 MG per tablet Take 1 tablet by mouth daily.  . citalopram (CELEXA) 40 MG tablet Take 40 mg by mouth daily.   . diphenhydrAMINE (BENADRYL) 25 mg capsule Take 1 capsule (25 mg total) by mouth every 4 (four) hours as needed.  . docusate sodium (COLACE) 100 MG capsule Take 100 mg by mouth  daily.  . famotidine (PEPCID) 40 MG tablet Take 1 tablet (40 mg total) by mouth daily.  Marland Kitchen LEVEMIR FLEXTOUCH 100 UNIT/ML Pen Inject 15 Units into the skin daily.  Marland Kitchen levothyroxine (SYNTHROID, LEVOTHROID) 75 MCG tablet Take 75 mcg by mouth daily.  . memantine (NAMENDA) 5 MG tablet Take 1 tablet (5 mg total) by mouth 2 (two) times daily.  . traMADol (ULTRAM) 50 MG tablet Take 1 tablet (50 mg total) by mouth every 8 (eight) hours as needed for moderate pain.  Marland Kitchen zolpidem (AMBIEN) 5 MG tablet Take 5 mg by mouth at bedtime.   . [DISCONTINUED] DOCUSATE CALCIUM PO Take 100 mg by mouth.   . [DISCONTINUED] ciprofloxacin (CIPRO) 500 MG tablet TAKE 1 TABLET BY MOUTH TWICE DAILY FOR 14 DAYS   No facility-administered encounter medications on file as of 12/17/2017.     Surgical History: Past Surgical History:  Procedure Laterality Date  . CARDIAC CATHETERIZATION     [facility]  . CATARACT EXTRACTION    . LUNG LOBECTOMY    . THYROID SURGERY      Medical History: Past Medical History:  Diagnosis Date  . Arthritis   . Chorea   . COPD (chronic obstructive pulmonary disease) ([place])   . Diabetes mellitus without complication ([place])   . Ear infection   . Environmental allergies   . GERD (gastroesophageal reflux disease)   . Hx of rheumatic fever   . Hx: UTI (urinary tract infection)   . Hyperlipidemia   .  Hyperlipidemia   . Hypertension   . Hypothyroidism   . Kidney disease   . Lung cancer ([place])   . Mini stroke ([place])   . Stroke ([place])    x's 2  . Vertigo     Family History: Family History  Problem Relation Age of Onset  . Heart attack Father   . Heart disease Father     Social History   Socioeconomic History  . Marital status: Widowed    Spouse name: Not on file  . Number of children: Not on file  . Years of education: Not on file  . Highest education level: Not on file  Social Needs  . Financial resource strain: Not on file  . Food insecurity - worry: Not on file  . Food insecurity -  inability: Not on file  . Transportation needs - medical: Not on file  . Transportation needs - non-medical: Not on file  Occupational History  . Not on file  Tobacco Use  . Smoking status: Former Smoker    Types: Cigarettes  . Smokeless tobacco: Never Used  . Tobacco comment: Quit X30 years ago  Substance and Sexual Activity  . Alcohol use: No  . Drug use: No  . Sexual activity: Not on file  Other Topics Concern  . Not on file  Social History Narrative  . Not on file      Review of Systems  Constitutional: Negative for chills, fatigue and unexpected weight change.  HENT: Positive for postnasal drip. Negative for congestion, rhinorrhea, sneezing and sore throat.   Eyes: Negative for redness.  Respiratory: Negative for cough, chest tightness and shortness of breath.   Cardiovascular: Negative for chest pain and palpitations.  Gastrointestinal: Negative for abdominal pain, constipation, diarrhea, nausea and vomiting.  Genitourinary: Negative for dysuria and frequency.  Musculoskeletal: Positive for joint swelling. Negative for arthralgias, back pain and neck pain.  Skin: Negative for rash.  Neurological: Positive for numbness. Negative for tremors.  Hematological: Negative for adenopathy. Does not bruise/bleed easily.  Psychiatric/Behavioral: Positive for behavioral problems (Depression). Negative for sleep disturbance and suicidal ideas. The patient is not nervous/anxious.     Vital Signs: BP (!) 115/49 (BP Location: Left Arm, Patient Position: Sitting)   Pulse 64   Resp 16   Ht 5\' 3"  (1.6 m)   Wt 127 lb 6.4 oz (57.8 kg)   SpO2 96%   BMI 22.57 kg/m    Physical Exam  Constitutional: She is oriented to person, place, and time. She appears well-developed and well-nourished. No distress.  HENT:  Head: Normocephalic and atraumatic.  Mouth/Throat: Oropharynx is clear and moist. No oropharyngeal exudate.  Eyes: EOM are normal. Pupils are equal, round, and reactive to  light.  Neck: Normal range of motion. Neck supple. No JVD present. No tracheal deviation present. No thyromegaly present.  Cardiovascular: Normal rate, regular rhythm and normal heart sounds. Exam reveals no gallop and no friction rub.  No murmur heard. Pulmonary/Chest: Effort normal. No respiratory distress. She has no wheezes. She has no rales. She exhibits no tenderness.  Abdominal: Soft. Bowel sounds are normal.  Musculoskeletal: Normal range of motion.  Lymphadenopathy:    She has no cervical adenopathy.  Neurological: She is alert and oriented to person, place, and time. No cranial nerve deficit.  Skin: Skin is warm and dry. She is not diaphoretic.  Psychiatric: She has a normal mood and affect. Her behavior is normal. Judgment and thought content normal.    Assessment/Plan: 1. Uncontrolled  type 2 diabetes mellitus with hyperglycemia (HCC) - POCT HgB A1C ( 6.10  2. Late onset Alzheimer's disease without behavioral disturbance - Increase Nmaenda to 10 mg po bid and Add Aricept 5 mg po qd  - Ambulatory referral to Neurology  3. Weakness generalized - Might be related to hypotension  4. Essential hypertension, benign - Decrease / Stop Norvasc if bp is below 110  5. Pain of right hip joint - Ambulatory referral to Orthopedic Surgery  General Counseling: etsuko dierolf understanding of the findings of todays visit and agrees with plan of treatment. I have discussed any further diagnostic evaluation that may be needed or ordered today. We also reviewed her medications today. she has been encouraged to call the office with any questions or concerns that should arise related to todays visit  No orders of the defined types were placed in this encounter.  Time spent: [place] [facility]

## 2017-12-22 ENCOUNTER — Encounter: Payer: Self-pay | Admitting: Internal Medicine

## 2017-12-29 DIAGNOSIS — M7061 Trochanteric bursitis, right hip: Secondary | ICD-10-CM | POA: Diagnosis not present

## 2017-12-29 DIAGNOSIS — M150 Primary generalized (osteo)arthritis: Secondary | ICD-10-CM | POA: Diagnosis not present

## 2018-01-02 ENCOUNTER — Telehealth: Payer: Self-pay

## 2018-01-02 NOTE — Telephone Encounter (Signed)
RETURNED PT'S SON MESSAGE FROM MY CHART. LMOM ADVISING HIM OF PT REFER APPTS AND TO CAL LBACKMONDAY OF HE HAS ANY FURTHER QUESTIONS/BR

## 2018-01-19 ENCOUNTER — Encounter: Payer: Self-pay | Admitting: Internal Medicine

## 2018-01-21 ENCOUNTER — Encounter: Payer: Self-pay | Admitting: Emergency Medicine

## 2018-01-21 ENCOUNTER — Emergency Department
Admission: EM | Admit: 2018-01-21 | Discharge: 2018-01-21 | Disposition: A | Discharge status: Home / Self Care | Payer: Medicare Other | Attending: Emergency Medicine | Admitting: Emergency Medicine

## 2018-01-21 ENCOUNTER — Other Ambulatory Visit: Payer: Self-pay

## 2018-01-21 DIAGNOSIS — I129 Hypertensive chronic kidney disease with stage 1 through stage 4 chronic kidney disease, or unspecified chronic kidney disease: Secondary | ICD-10-CM | POA: Insufficient documentation

## 2018-01-21 DIAGNOSIS — Z7982 Long term (current) use of aspirin: Secondary | ICD-10-CM | POA: Insufficient documentation

## 2018-01-21 DIAGNOSIS — J449 Chronic obstructive pulmonary disease, unspecified: Secondary | ICD-10-CM | POA: Insufficient documentation

## 2018-01-21 DIAGNOSIS — E039 Hypothyroidism, unspecified: Secondary | ICD-10-CM | POA: Insufficient documentation

## 2018-01-21 DIAGNOSIS — E1122 Type 2 diabetes mellitus with diabetic chronic kidney disease: Secondary | ICD-10-CM | POA: Diagnosis not present

## 2018-01-21 DIAGNOSIS — Z87891 Personal history of nicotine dependence: Secondary | ICD-10-CM | POA: Diagnosis not present

## 2018-01-21 DIAGNOSIS — R531 Weakness: Secondary | ICD-10-CM | POA: Diagnosis present

## 2018-01-21 DIAGNOSIS — R42 Dizziness and giddiness: Secondary | ICD-10-CM

## 2018-01-21 DIAGNOSIS — Z85118 Personal history of other malignant neoplasm of bronchus and lung: Secondary | ICD-10-CM | POA: Insufficient documentation

## 2018-01-21 DIAGNOSIS — Z794 Long term (current) use of insulin: Secondary | ICD-10-CM | POA: Insufficient documentation

## 2018-01-21 DIAGNOSIS — N182 Chronic kidney disease, stage 2 (mild): Secondary | ICD-10-CM | POA: Insufficient documentation

## 2018-01-21 DIAGNOSIS — Z79899 Other long term (current) drug therapy: Secondary | ICD-10-CM | POA: Diagnosis not present

## 2018-01-21 DIAGNOSIS — R9082 White matter disease, unspecified: Secondary | ICD-10-CM | POA: Diagnosis not present

## 2018-01-21 LAB — URINALYSIS, COMPLETE (UACMP) WITH MICROSCOPIC
Bacteria, UA: NONE SEEN
Bilirubin Urine: NEGATIVE
Glucose, UA: NEGATIVE mg/dL
Ketones, ur: NEGATIVE mg/dL
Nitrite: NEGATIVE
PROTEIN: NEGATIVE mg/dL
SPECIFIC GRAVITY, URINE: 1.013 (ref 1.005–1.030)
pH: 5 (ref 5.0–8.0)

## 2018-01-21 LAB — CBC
HCT: 36.4 % (ref 35.0–47.0)
Hemoglobin: 11.6 g/dL — ABNORMAL LOW (ref 12.0–16.0)
MCH: 25.7 pg — AB (ref 26.0–34.0)
MCHC: 31.8 g/dL — AB (ref 32.0–36.0)
MCV: 80.9 fL (ref 80.0–100.0)
PLATELETS: 224 10*3/uL (ref 150–440)
RBC: 4.5 MIL/uL (ref 3.80–5.20)
RDW: 17.6 % — AB (ref 11.5–14.5)
WBC: 11 10*3/uL (ref 3.6–11.0)

## 2018-01-21 LAB — BASIC METABOLIC PANEL
Anion gap: 9 (ref 5–15)
BUN: 30 mg/dL — AB (ref 6–20)
CALCIUM: 9 mg/dL (ref 8.9–10.3)
CO2: 26 mmol/L (ref 22–32)
CREATININE: 1.17 mg/dL — AB (ref 0.44–1.00)
Chloride: 106 mmol/L (ref 101–111)
GFR calc Af Amer: 47 mL/min — ABNORMAL LOW (ref >60)
GFR calc non Af Amer: 41 mL/min — ABNORMAL LOW (ref >60)
Glucose, Bld: 103 mg/dL — ABNORMAL HIGH (ref 65–99)
Potassium: 4.3 mmol/L (ref 3.5–5.1)
Sodium: 141 mmol/L (ref 135–145)

## 2018-01-21 LAB — TROPONIN I: Troponin I: <0.03 ng/mL (ref <0.03)

## 2018-01-21 NOTE — ED Triage Notes (Signed)
Here for generalized weakness and dizziness since yesterday AM.  Denies pain other than her chronic hip pain.  Pt unable to give good history of current symptoms. Speech clear. NAD. VSS. No fevers.

## 2018-01-21 NOTE — ED Provider Notes (Signed)
[facility] Emergency Department Provider Note  ___________________________________________   First MD Initiated Contact with Patient 01/21/18 1518     (approximate)  I have reviewed the triage vital signs and the nursing notes.   HISTORY  Chief Complaint Weakness  HPI LEVAEH VICE is a 84 y.o. female with history of COPD as well as diabetes and stroke x2 who was presented to the emergency department with dizziness/being off balance when she goes from laying to sitting position or sitting to standing position.  The patient says that she has had this ongoing for several years and was sent over from the neurologist office earlier today for evaluation in the emergency department for concern for stroke.  However, the patient says that this is her baseline and she denies any other weakness.  Says that she is eating and drinking well lately.  Does not report any dysuria.  I reviewed the note from the medical assistant earlier today that reported what appears to be radiology findings.  However, the patient denies having any radiological studies over the past week or any new symptoms.  Patient also states that she has a history of vertigo and uses meclizine as needed at home.   Past Medical History:  Diagnosis Date  . Arthritis   . Chorea   . COPD (chronic obstructive pulmonary disease) ([place])   . Diabetes mellitus without complication ([place])   . Ear infection   . Environmental allergies   . GERD (gastroesophageal reflux disease)   . Hx of rheumatic fever   . Hx: UTI (urinary tract infection)   . Hyperlipidemia   . Hyperlipidemia   . Hypertension   . Hypothyroidism   . Kidney disease   . Lung cancer ([place])   . Mini stroke ([place])   . Stroke ([place])    x's 2  . Vertigo     Patient Active Problem List   Diagnosis Date Noted  . Bursitis of hip 12/17/2017  . Osteoarthritis of knee 12/17/2017  . Primary localized osteoarthritis of pelvic region and thigh  12/17/2017  . Cervical spondylosis with myelopathy 07/23/2017  . Closed nondisplaced fracture of second cervical vertebra with routine healing 07/23/2017  . CAP (community acquired pneumonia) 11/11/2015  . Pain in the chest 03/21/2015  . Essential hypertension 03/21/2015  . Malignant neoplasm of unspecified part of unspecified bronchus or lung ([place]) 02/14/2014  . Chronic cystitis 08/05/2013  . Chronic kidney disease, stage II (mild) 08/05/2013  . Incomplete emptying of bladder 08/05/2013  . Kidney stone 08/05/2013  . Microscopic hematuria 08/05/2013  . Mixed incontinence 08/05/2013  . Uncertain tumor of kidney and ureter 08/05/2013    Past Surgical History:  Procedure Laterality Date  . CARDIAC CATHETERIZATION     [facility]  . CATARACT EXTRACTION    . LUNG LOBECTOMY    . THYROID SURGERY      Prior to Admission medications   Medication Sig Start Date End Date Taking? Authorizing Provider  allopurinol (ZYLOPRIM) 100 MG tablet Take one and one-half tablets at night  For gout 11/27/17   Ronnell Freshwater, NP  ALPRAZolam Duanne Moron) 0.25 MG tablet Take 0.25 mg by mouth at bedtime as needed for anxiety.    [provider]  amLODipine (NORVASC) 5 MG tablet Take 5 mg by mouth daily.    [provider]  aspirin 325 MG tablet Take 325 mg by mouth daily.    [provider]  bisoprolol-hydrochlorothiazide (ZIAC) 5-6.25 MG per tablet Take 1 tablet  by mouth daily.    [provider]  citalopram (CELEXA) 40 MG tablet Take 40 mg by mouth daily.     [provider]  diphenhydrAMINE (BENADRYL) 25 mg capsule Take 1 capsule (25 mg total) by mouth every 4 (four) hours as needed. 10/06/17 10/06/18  Eula Listen, MD  docusate sodium (COLACE) 100 MG capsule Take 100 mg by mouth daily.    [provider]  donepezil (ARICEPT) 5 MG tablet Take 1 tablet (5 mg total) by mouth at bedtime. 12/17/17   Lavera Guise, MD  famotidine (PEPCID) 40 MG tablet Take 1  tablet (40 mg total) by mouth daily. 10/06/17 10/06/18  Eula Listen, MD  LEVEMIR FLEXTOUCH 100 UNIT/ML Pen Inject 15 Units into the skin daily. 09/20/17   [provider]  levothyroxine (SYNTHROID, LEVOTHROID) 75 MCG tablet Take 75 mcg by mouth daily. 12/26/16   [provider]  memantine (NAMENDA) 5 MG tablet Take 2 tablets (10 mg total) by mouth 2 (two) times daily. 12/17/17   Lavera Guise, MD  traMADol (ULTRAM) 50 MG tablet Take 1 tablet (50 mg total) by mouth every 8 (eight) hours as needed for moderate pain. 12/01/17   Ronnell Freshwater, NP  zolpidem (AMBIEN) 5 MG tablet Take 5 mg by mouth at bedtime.     [provider]    Allergies Shellfish allergy; Aspirin; Iodine; Sulfa antibiotics; Vasotec [enalapril]; and Prednisone  Family History  Problem Relation Age of Onset  . Heart attack Father   . Heart disease Father     Social History Social History   Tobacco Use  . Smoking status: Former Smoker    Types: Cigarettes  . Smokeless tobacco: Never Used  . Tobacco comment: Quit X30 years ago  Substance Use Topics  . Alcohol use: No  . Drug use: No    Review of Systems  Constitutional: No fever/chills Eyes: No visual changes. ENT: No sore throat. Cardiovascular: Denies chest pain. Respiratory: Denies shortness of breath. Gastrointestinal: No abdominal pain.  No nausea, no vomiting.  No diarrhea.  No constipation. Genitourinary: Negative for dysuria. Musculoskeletal: Negative for back pain. Skin: Negative for rash. Neurological: Negative for headaches, focal weakness or numbness.   ____________________________________________   PHYSICAL EXAM:  VITAL SIGNS: ED Triage Vitals  Enc Vitals Group     BP 01/21/18 1235 (!) 158/59     Pulse Rate 01/21/18 1234 68     Resp 01/21/18 1234 16     Temp 01/21/18 1234 97.7 F (36.5 C)     Temp Source 01/21/18 1234 Oral     SpO2 01/21/18 1234 99 %     Weight 01/21/18 1235 128 lb (58.1 kg)      Height 01/21/18 1235 5\' 2"  (1.575 m)     Head Circumference --      Peak Flow --      Pain Score 01/21/18 1234 5     Pain Loc --      Pain Edu? --      Excl. in [place]? --     Constitutional: Alert and oriented. Well appearing and in no acute distress. Eyes: Conjunctivae are normal.  Head: Atraumatic. Nose: No congestion/rhinnorhea. Mouth/Throat: Mucous membranes are moist.  Neck: No stridor.   Cardiovascular: Normal rate, regular rhythm. Grossly normal heart sounds.  Good peripheral circulation. Respiratory: Normal respiratory effort.  No retractions. Lungs CTAB. Gastrointestinal: Soft and nontender. No distention. No CVA tenderness. Musculoskeletal: No lower extremity tenderness nor edema.  No joint  effusions. Neurologic:  Normal speech and language. No gross focal neurologic deficits are appreciated.  No nystagmus.  No ataxia on finger-to-nose testing. Skin:  Skin is warm, dry and intact. No rash noted. Psychiatric: Mood and affect are normal. Speech and behavior are normal.  ____________________________________________   LABS (all labs ordered are listed, but only abnormal results are displayed)  Labs Reviewed  BASIC METABOLIC PANEL - Abnormal; Notable for the following components:      Result Value   Glucose, Bld 103 (*)    BUN 30 (*)    Creatinine, Ser 1.17 (*)    GFR calc non Af Amer 41 (*)    GFR calc Af Amer 47 (*)    All other components within normal limits  CBC - Abnormal; Notable for the following components:   Hemoglobin 11.6 (*)    MCH 25.7 (*)    MCHC 31.8 (*)    RDW 17.6 (*)    All other components within normal limits  URINALYSIS, COMPLETE (UACMP) WITH MICROSCOPIC - Abnormal; Notable for the following components:   Color, Urine YELLOW (*)    APPearance CLEAR (*)    Hgb urine dipstick SMALL (*)    Leukocytes, UA SMALL (*)    Squamous Epithelial / LPF 0-5 (*)    All other components within normal limits  TROPONIN I    ____________________________________________  EKG  ED ECG REPORT I, Doran Stabler, the attending physician, personally viewed and interpreted this ECG.   Date: 01/21/2018  EKG Time: 1242  Rate: 67  Rhythm: normal sinus rhythm  Axis: Normal  Intervals:none  ST&T Change: No ST segment elevation or depression.  No abnormal T wave inversion.  ____________________________________________  RADIOLOGY   ____________________________________________   PROCEDURES  Procedure(s) performed:   Procedures  Critical Care performed:   ____________________________________________   INITIAL IMPRESSION / ASSESSMENT AND PLAN / ED COURSE  Pertinent labs & imaging results that were available during my care of the patient were reviewed by me and considered in my medical decision making (see chart for details).  DDX: Weakness, CVA, near-syncope, peripheral vertigo, UTI, anemia, renal failure, likely disturbance As part of my medical decision making, I reviewed the following data within the [facility] chart reviewed and Notes from prior ED visits  Patient at her baseline mentation.  Her caretakers at the bedside and says that she does not report any change in behavior or focal neurologic findings.  Likely ongoing symptoms.  I recommend that the patient reschedule her appointment with the neurologist at the [facility] clinic.  She is understanding of this plan and willing to comply.     ____________________________________________   FINAL CLINICAL IMPRESSION(S) / ED DIAGNOSES  Vertigo.    NEW MEDICATIONS STARTED DURING THIS VISIT:  New Prescriptions   No medications on file     Note:  This document was prepared using Dragon voice recognition software and may include unintentional dictation errors.     Orbie Pyo, MD 01/21/18 [phone]

## 2018-01-21 NOTE — ED Notes (Signed)
Pt A/O to baseline. C/o dizziness when changing position quickly. Pt understands not to jump up when dizzy.

## 2018-01-22 ENCOUNTER — Telehealth: Payer: Self-pay

## 2018-01-22 NOTE — Telephone Encounter (Signed)
PT SON SEND MESSAGE  THROUGH  MYCHART I CALLED BACK GAVE TO [facility] FOR REFERRAL

## 2018-01-25 ENCOUNTER — Encounter: Payer: Self-pay | Admitting: Internal Medicine

## 2018-01-27 ENCOUNTER — Telehealth: Payer: Self-pay

## 2018-01-27 NOTE — Telephone Encounter (Signed)
Alprazolam to [facility] with 2 refills.  dbs

## 2018-01-28 ENCOUNTER — Telehealth: Payer: Self-pay

## 2018-01-28 NOTE — Telephone Encounter (Signed)
[place] from [place] called in regards to pt levothyroxine manufacturer. She wanted a verbal ok to change the manufacturer for pt rx from sandus to mylan because they do not carry sandus. I spoke with Leretha Pol and she said it was okay to do that. I let [place] know that Nira Conn gave the okay to change manufacturer.

## 2018-02-05 DIAGNOSIS — G8929 Other chronic pain: Secondary | ICD-10-CM | POA: Diagnosis not present

## 2018-02-05 DIAGNOSIS — M1611 Unilateral primary osteoarthritis, right hip: Secondary | ICD-10-CM | POA: Diagnosis not present

## 2018-02-05 DIAGNOSIS — M545 Low back pain: Secondary | ICD-10-CM | POA: Diagnosis not present

## 2018-02-06 DIAGNOSIS — R9082 White matter disease, unspecified: Secondary | ICD-10-CM | POA: Insufficient documentation

## 2018-02-24 DIAGNOSIS — M1611 Unilateral primary osteoarthritis, right hip: Secondary | ICD-10-CM | POA: Diagnosis not present

## 2018-03-17 ENCOUNTER — Ambulatory Visit: Payer: Self-pay | Admitting: Internal Medicine

## 2018-03-20 DIAGNOSIS — M1611 Unilateral primary osteoarthritis, right hip: Secondary | ICD-10-CM | POA: Diagnosis not present

## 2018-03-20 DIAGNOSIS — M5136 Other intervertebral disc degeneration, lumbar region: Secondary | ICD-10-CM | POA: Diagnosis not present

## 2018-04-02 DIAGNOSIS — M5136 Other intervertebral disc degeneration, lumbar region: Secondary | ICD-10-CM | POA: Diagnosis not present

## 2018-04-02 DIAGNOSIS — M79651 Pain in right thigh: Secondary | ICD-10-CM | POA: Diagnosis not present

## 2018-04-02 DIAGNOSIS — M25551 Pain in right hip: Secondary | ICD-10-CM | POA: Diagnosis not present

## 2018-04-07 ENCOUNTER — Encounter: Payer: Self-pay | Admitting: Internal Medicine

## 2018-04-07 ENCOUNTER — Ambulatory Visit (INDEPENDENT_AMBULATORY_CARE_PROVIDER_SITE_OTHER): Payer: Medicare Other | Admitting: Internal Medicine

## 2018-04-07 VITALS — BP 150/78 | HR 74 | Resp 16 | Ht 63.0 in | Wt 131.8 lb

## 2018-04-07 DIAGNOSIS — E1165 Type 2 diabetes mellitus with hyperglycemia: Secondary | ICD-10-CM | POA: Diagnosis not present

## 2018-04-07 DIAGNOSIS — G301 Alzheimer's disease with late onset: Secondary | ICD-10-CM | POA: Diagnosis not present

## 2018-04-07 DIAGNOSIS — I10 Essential (primary) hypertension: Secondary | ICD-10-CM | POA: Diagnosis not present

## 2018-04-07 DIAGNOSIS — F0280 Dementia in other diseases classified elsewhere without behavioral disturbance: Secondary | ICD-10-CM | POA: Diagnosis not present

## 2018-04-07 DIAGNOSIS — M25551 Pain in right hip: Secondary | ICD-10-CM

## 2018-04-07 LAB — POCT GLYCOSYLATED HEMOGLOBIN (HGB A1C): HEMOGLOBIN A1C: 6.7 % — AB (ref 4.0–5.6)

## 2018-04-07 NOTE — Progress Notes (Signed)
[facility] [place], [place] [address]  Internal MEDICINE  Office Visit Note  Patient Name: Samantha Franco  [id number]  [id number]  Date of Service: 04/07/2018  Chief Complaint  Patient presents with  . Hip Pain    going on several weeks   . Diabetes  . Dementia   Pt is here for routine follow up. She is feeling better,    Hip Pain   The incident occurred more than 1 week ago. There was no injury mechanism. The pain is present in the right hip. The pain is at a severity of 5/10. The pain is moderate. The pain has been fluctuating since onset.  Diabetes  She presents for her follow-up diabetic visit. She has type 2 diabetes mellitus. Her disease course has been stable. Pertinent negatives for hypoglycemia include no dizziness or headaches. Pertinent negatives for diabetes include no chest pain and no fatigue.  Other  Chronicity: pt is here alone, keeps talking about people are staying with her, she does not like it, pt is comfortable with STM loss, keeps talking about her husband  Pertinent negatives include no abdominal pain, arthralgias, chest pain, chills, coughing, diaphoresis, fatigue, headaches, nausea, neck pain or vomiting.      Current Medication: Outpatient Encounter Medications as of 04/07/2018  Medication Sig  . allopurinol (ZYLOPRIM) 100 MG tablet Take one and one-half tablets at night  For gout  . ALPRAZolam (XANAX) 0.25 MG tablet Take 0.25 mg by mouth at bedtime as needed for anxiety.  Marland Kitchen amLODipine (NORVASC) 5 MG tablet Take 5 mg by mouth daily.  Marland Kitchen aspirin 325 MG tablet Take 325 mg by mouth daily.  . bisoprolol-hydrochlorothiazide (ZIAC) 5-6.25 MG per tablet Take 1 tablet by mouth daily.  . citalopram (CELEXA) 40 MG tablet Take 40 mg by mouth daily.   . diphenhydrAMINE (BENADRYL) 25 mg capsule Take 1 capsule (25 mg total) by mouth every 4 (four) hours as needed.  . docusate sodium (COLACE) 100 MG capsule Take 100 mg by mouth daily.  Marland Kitchen  donepezil (ARICEPT) 5 MG tablet Take 1 tablet (5 mg total) by mouth at bedtime.  . famotidine (PEPCID) 40 MG tablet Take 1 tablet (40 mg total) by mouth daily.  Marland Kitchen LEVEMIR FLEXTOUCH 100 UNIT/ML Pen Inject 15 Units into the skin daily.  Marland Kitchen levothyroxine (SYNTHROID, LEVOTHROID) 75 MCG tablet Take 75 mcg by mouth daily.  . memantine (NAMENDA) 5 MG tablet Take 2 tablets (10 mg total) by mouth 2 (two) times daily.  . traMADol (ULTRAM) 50 MG tablet Take 1 tablet (50 mg total) by mouth every 8 (eight) hours as needed for moderate pain.  Marland Kitchen zolpidem (AMBIEN) 5 MG tablet Take 5 mg by mouth at bedtime.    No facility-administered encounter medications on file as of 04/07/2018.     Surgical History: Past Surgical History:  Procedure Laterality Date  . CARDIAC CATHETERIZATION     [facility]  . CATARACT EXTRACTION    . LUNG LOBECTOMY    . THYROID SURGERY      Medical History: Past Medical History:  Diagnosis Date  . Arthritis   . Chorea   . COPD (chronic obstructive pulmonary disease) ([place])   . Diabetes mellitus without complication ([place])   . Ear infection   . Environmental allergies   . GERD (gastroesophageal reflux disease)   . Hx of rheumatic fever   . Hx: UTI (urinary tract infection)   . Hyperlipidemia   . Hyperlipidemia   . Hypertension   .  Hypothyroidism   . Kidney disease   . Lung cancer ([place])   . Mini stroke ([place])   . Stroke ([place])    x's 2  . Vertigo     Family History: Family History  Problem Relation Age of Onset  . Heart attack Father   . Heart disease Father     Social History   Socioeconomic History  . Marital status: Widowed    Spouse name: Not on file  . Number of children: Not on file  . Years of education: Not on file  . Highest education level: Not on file  Occupational History  . Not on file  Social Needs  . Financial resource strain: Not on file  . Food insecurity:    Worry: Not on file    Inability: Not on file  . Transportation needs:    Medical:  Not on file    Non-medical: Not on file  Tobacco Use  . Smoking status: Former Smoker    Types: Cigarettes  . Smokeless tobacco: Never Used  . Tobacco comment: Quit X30 years ago  Substance and Sexual Activity  . Alcohol use: No  . Drug use: No  . Sexual activity: Not on file  Lifestyle  . Physical activity:    Days per week: Not on file    Minutes per session: Not on file  . Stress: Not on file  Relationships  . Social connections:    Talks on phone: Not on file    Gets together: Not on file    Attends religious service: Not on file    Active member of club or organization: Not on file    Attends meetings of clubs or organizations: Not on file    Relationship status: Not on file  . Intimate partner violence:    Fear of current or ex partner: Not on file    Emotionally abused: Not on file    Physically abused: Not on file    Forced sexual activity: Not on file  Other Topics Concern  . Not on file  Social History Narrative  . Not on file    Review of Systems  Constitutional: Negative for chills, diaphoresis and fatigue.  HENT: Negative for ear pain, postnasal drip and sinus pressure.   Eyes: Negative for photophobia, discharge, redness, itching and visual disturbance.  Respiratory: Negative for cough, shortness of breath and wheezing.   Cardiovascular: Negative for chest pain, palpitations and leg swelling.  Gastrointestinal: Negative for abdominal pain, constipation, diarrhea, nausea and vomiting.  Genitourinary: Negative for dysuria and flank pain.  Musculoskeletal: Negative for arthralgias, back pain, gait problem and neck pain.  Skin: Negative for color change.  Allergic/Immunologic: Negative for environmental allergies and food allergies.  Neurological: Negative for dizziness and headaches.  Hematological: Does not bruise/bleed easily.  Psychiatric/Behavioral: Negative for agitation, behavioral problems (depression) and hallucinations.    Vital Signs: BP (!)  150/78   Pulse 74   Resp 16   Ht 5\' 3"  (1.6 m)   Wt 131 lb 12.8 oz (59.8 kg)   SpO2 96%   BMI 23.35 kg/m    Physical Exam  Constitutional: She is oriented to person, place, and time. She appears well-developed and well-nourished. No distress.  HENT:  Head: Normocephalic and atraumatic.  Mouth/Throat: Oropharynx is clear and moist. No oropharyngeal exudate.  Eyes: Pupils are equal, round, and reactive to light. EOM are normal.  Neck: Normal range of motion. Neck supple. No JVD present. No tracheal deviation present. No  thyromegaly present.  Cardiovascular: Normal rate, regular rhythm and normal heart sounds. Exam reveals no gallop and no friction rub.  No murmur heard. Pulmonary/Chest: Effort normal. No respiratory distress. She has no wheezes. She has no rales. She exhibits no tenderness.  Abdominal: Soft. Bowel sounds are normal.  Musculoskeletal: Normal range of motion.  Lymphadenopathy:    She has no cervical adenopathy.  Neurological: She is alert and oriented to person, place, and time. No cranial nerve deficit.  Skin: Skin is warm and dry. She is not diaphoretic.  Psychiatric: She has a normal mood and affect. Her behavior is normal. Judgment and thought content normal.   Assessment/Plan: 1. Uncontrolled type 2 diabetes mellitus with hyperglycemia (HCC) - POCT HgB A1C, Improved   2. Late onset Alzheimer's disease without behavioral disturbance - Seems to be stable, pt is alone in the office today, will like to increase aricept however will not be able to do so  3. Essential hypertension, benign - Controlled with meds  4. Pain of right hip joint - Per ortho  General Counseling: Shonia verbalizes understanding of the findings of todays visit and agrees with plan of treatment. I have discussed any further diagnostic evaluation that may be needed or ordered today. We also reviewed her medications today. she has been encouraged to call the office with any questions or  concerns that should arise related to todays visit.  Orders Placed This Encounter  Procedures  . POCT HgB A1C    Time spent:025 Minutes   Dr Lavera Guise Internal medicine

## 2018-04-09 ENCOUNTER — Telehealth: Payer: Self-pay

## 2018-04-09 NOTE — Telephone Encounter (Signed)
Pt son sent a message on mychart in concern of pt erratic behavior. Spoke with Dr. Clayborn Bigness and called pt son to let him know of our encounter with pt and that we are going to get a referral done for psychiatry. Will be sending message to beth.

## 2018-04-10 ENCOUNTER — Telehealth: Payer: Self-pay

## 2018-04-13 ENCOUNTER — Other Ambulatory Visit: Payer: Self-pay | Admitting: Internal Medicine

## 2018-04-14 ENCOUNTER — Other Ambulatory Visit: Payer: Self-pay

## 2018-04-14 MED ORDER — GLUCOSE BLOOD VI STRP: ORAL_STRIP | 5 refills | Status: DC

## 2018-04-22 ENCOUNTER — Encounter: Payer: Self-pay | Admitting: Internal Medicine

## 2018-04-22 DIAGNOSIS — G309 Alzheimer's disease, unspecified: Secondary | ICD-10-CM | POA: Diagnosis not present

## 2018-04-22 DIAGNOSIS — R9082 White matter disease, unspecified: Secondary | ICD-10-CM | POA: Diagnosis not present

## 2018-04-22 DIAGNOSIS — F0151 Vascular dementia with behavioral disturbance: Secondary | ICD-10-CM | POA: Diagnosis not present

## 2018-04-23 NOTE — Telephone Encounter (Signed)
Can we discuss this please

## 2018-04-24 ENCOUNTER — Telehealth: Payer: Self-pay

## 2018-04-24 ENCOUNTER — Other Ambulatory Visit: Payer: Self-pay

## 2018-04-24 MED ORDER — CONTINUOUS BLOOD GLUC SENSOR MISC: 11 refills | Status: AC

## 2018-04-24 NOTE — Telephone Encounter (Signed)
lmom for Samantha Franco son that we send new monitor to [facility]

## 2018-05-05 ENCOUNTER — Encounter: Payer: Self-pay | Admitting: Internal Medicine

## 2018-05-06 ENCOUNTER — Other Ambulatory Visit: Payer: Self-pay

## 2018-05-06 MED ORDER — ALPRAZOLAM 0.25 MG PO TABS: 0.2500 mg | ORAL_TABLET | Freq: Every evening | ORAL | 2 refills | Status: DC | PRN

## 2018-05-06 NOTE — Telephone Encounter (Signed)
Called in [facility] alprazolam take 1 tab po qhs at bedtime 30 with 2 refill as per dfk and also advised pt son that we called to [facility]

## 2018-05-06 NOTE — Telephone Encounter (Signed)
Can u check how many pills and refills

## 2018-05-07 ENCOUNTER — Encounter: Payer: Self-pay | Admitting: Internal Medicine

## 2018-05-07 ENCOUNTER — Other Ambulatory Visit: Payer: Self-pay | Admitting: Internal Medicine

## 2018-05-08 ENCOUNTER — Other Ambulatory Visit: Payer: Self-pay

## 2018-05-08 MED ORDER — BISOPROLOL-HYDROCHLOROTHIAZIDE 5-6.25 MG PO TABS: 1.0000 | ORAL_TABLET | Freq: Every day | ORAL | 1 refills | Status: DC

## 2018-05-08 MED ORDER — AMLODIPINE BESYLATE 5 MG PO TABS: 5.0000 mg | ORAL_TABLET | Freq: Every day | ORAL | 1 refills | Status: DC

## 2018-05-22 DIAGNOSIS — M1611 Unilateral primary osteoarthritis, right hip: Secondary | ICD-10-CM | POA: Diagnosis not present

## 2018-05-22 DIAGNOSIS — M5136 Other intervertebral disc degeneration, lumbar region: Secondary | ICD-10-CM | POA: Diagnosis not present

## 2018-05-24 ENCOUNTER — Encounter: Payer: Self-pay | Admitting: Internal Medicine

## 2018-05-25 ENCOUNTER — Encounter: Payer: Self-pay | Admitting: Internal Medicine

## 2018-05-26 ENCOUNTER — Telehealth: Payer: Self-pay

## 2018-05-26 NOTE — Telephone Encounter (Signed)
PT SON REACHED OUT TO [place] THROUGH MYCHART IN REGARDS TO SENDING CITALOPRAM AND SEROQUIL, WANTING TO POSSIBLY INCREASE DOSAGE OF SEROQUIL AND REFILL THESE RX.   SPOKE WITH DR. Clayborn Bigness AND SHE SAID THAT SINCE SHE SAW [facility] THEY SHOULD BE ABLE TO SEND BOTH OF THOSE IN.   LET PT SON TIM KNOW OF THE ABOVE. HE SAID SHE SAW A NEUROLOGIST. AND I LET HIM KNOW THEY SHOULD BE ABLE TO SEND THOSE IN.

## 2018-05-26 NOTE — Telephone Encounter (Signed)
They were seen by psych, they should call them

## 2018-05-26 NOTE — Telephone Encounter (Signed)
Patient did keep neurology appt and she is schd with dr Humphrey Rolls at [facility] for routine follow up.Samantha Franco

## 2018-06-17 DIAGNOSIS — M1611 Unilateral primary osteoarthritis, right hip: Secondary | ICD-10-CM | POA: Diagnosis not present

## 2018-06-30 ENCOUNTER — Telehealth: Payer: Self-pay

## 2018-06-30 ENCOUNTER — Ambulatory Visit (INDEPENDENT_AMBULATORY_CARE_PROVIDER_SITE_OTHER): Payer: Medicare Other | Admitting: Internal Medicine

## 2018-06-30 ENCOUNTER — Encounter: Payer: Self-pay | Admitting: Internal Medicine

## 2018-06-30 VITALS — BP 131/51 | HR 71 | Resp 16 | Ht 62.0 in | Wt 133.8 lb

## 2018-06-30 DIAGNOSIS — Z0001 Encounter for general adult medical examination with abnormal findings: Secondary | ICD-10-CM

## 2018-06-30 DIAGNOSIS — F0280 Dementia in other diseases classified elsewhere without behavioral disturbance: Secondary | ICD-10-CM

## 2018-06-30 DIAGNOSIS — G301 Alzheimer's disease with late onset: Secondary | ICD-10-CM | POA: Diagnosis not present

## 2018-06-30 DIAGNOSIS — E1165 Type 2 diabetes mellitus with hyperglycemia: Secondary | ICD-10-CM

## 2018-06-30 DIAGNOSIS — I10 Essential (primary) hypertension: Secondary | ICD-10-CM | POA: Diagnosis not present

## 2018-06-30 DIAGNOSIS — Z1231 Encounter for screening mammogram for malignant neoplasm of breast: Secondary | ICD-10-CM

## 2018-06-30 DIAGNOSIS — R300 Dysuria: Secondary | ICD-10-CM | POA: Diagnosis not present

## 2018-06-30 DIAGNOSIS — Z1239 Encounter for other screening for malignant neoplasm of breast: Secondary | ICD-10-CM

## 2018-06-30 LAB — POCT GLYCOSYLATED HEMOGLOBIN (HGB A1C): Hemoglobin A1C: 6.7 % — AB (ref 4.0–5.6)

## 2018-06-30 NOTE — Telephone Encounter (Signed)
Scheduled diabetic eye exam for patient at [facility] for January 8 at 12pm. Called and left voicemail with patients son (preferred contact) with the appointment information

## 2018-06-30 NOTE — Progress Notes (Signed)
[facility] [place], [place] [address]  Internal MEDICINE  Office Visit Note  Patient Name: Samantha Franco  [id number]  [id number]  Date of Service: 07/07/2018  Chief Complaint  Patient presents with  . Annual Exam  . Diabetes    3 month follow up  . Quality Metric Gaps    eye exam, foot exam     HPI Pt is here for routine health maintenance examination. Pt is here with ehr son in the room, denies any complaints at this time. DM is well controlled. Her memory loss/dementia is stable  Current Medication: Outpatient Encounter Medications as of 06/30/2018  Medication Sig  . allopurinol (ZYLOPRIM) 100 MG tablet Take one and one-half tablets at night  For gout  . ALPRAZolam (XANAX) 0.25 MG tablet Take 1 tablet (0.25 mg total) by mouth at bedtime as needed for anxiety.  Marland Kitchen amLODipine (NORVASC) 5 MG tablet Take 1 tablet (5 mg total) by mouth daily.  Marland Kitchen aspirin 325 MG tablet Take 325 mg by mouth daily.  . bisoprolol-hydrochlorothiazide (ZIAC) 5-6.25 MG tablet TAKE 1 TABLET BY MOUTH ONCE DAILY  . bisoprolol-hydrochlorothiazide (ZIAC) 5-6.25 MG tablet Take 1 tablet by mouth daily.  . citalopram (CELEXA) 40 MG tablet Take 40 mg by mouth daily.   . Continuous Blood Gluc Sensor MISC Use as directed every 14 days. May dispense FreeStyle [employer] or similar. diag E11.65  . diphenhydrAMINE (BENADRYL) 25 mg capsule Take 1 capsule (25 mg total) by mouth every 4 (four) hours as needed.  . docusate sodium (COLACE) 100 MG capsule Take 100 mg by mouth daily.  Marland Kitchen donepezil (ARICEPT) 5 MG tablet Take 1 tablet (5 mg total) by mouth at bedtime.  . famotidine (PEPCID) 40 MG tablet Take 1 tablet (40 mg total) by mouth daily.  Marland Kitchen glucose blood (ACCU-CHEK AVIVA PLUS) test strip USE 1 STRIP TO CHECK GLUCOSE TWICE DAILY diag E11.65  . LEVEMIR FLEXTOUCH 100 UNIT/ML Pen Inject 15 Units into the skin daily.  Marland Kitchen levothyroxine (SYNTHROID, LEVOTHROID) 75 MCG tablet TAKE 1 TABLET  BY MOUTH ONCE DAILY  . memantine (NAMENDA) 5 MG tablet Take 2 tablets (10 mg total) by mouth 2 (two) times daily.  . traMADol (ULTRAM) 50 MG tablet Take 1 tablet (50 mg total) by mouth every 8 (eight) hours as needed for moderate pain.  Marland Kitchen zolpidem (AMBIEN) 5 MG tablet Take 5 mg by mouth at bedtime.    No facility-administered encounter medications on file as of 06/30/2018.     Surgical History: Past Surgical History:  Procedure Laterality Date  . CARDIAC CATHETERIZATION     [facility]  . CATARACT EXTRACTION    . LUNG LOBECTOMY    . THYROID SURGERY      Medical History: Past Medical History:  Diagnosis Date  . Arthritis   . Chorea   . COPD (chronic obstructive pulmonary disease) ([place])   . Diabetes mellitus without complication ([place])   . Ear infection   . Environmental allergies   . GERD (gastroesophageal reflux disease)   . Hx of rheumatic fever   . Hx: UTI (urinary tract infection)   . Hyperlipidemia   . Hyperlipidemia   . Hypertension   . Hypothyroidism   . Kidney disease   . Lung cancer ([place])   . Mini stroke ([place])   . Stroke ([place])    x's 2  . Vertigo     Family History: Family History  Problem Relation Age of Onset  . Heart  attack Father   . Heart disease Father       Review of Systems  Constitutional: Negative for chills, diaphoresis and fatigue.  HENT: Negative for ear pain, postnasal drip and sinus pressure.   Eyes: Negative for photophobia, discharge, redness, itching and visual disturbance.  Respiratory: Negative for cough, shortness of breath and wheezing.   Cardiovascular: Negative for chest pain, palpitations and leg swelling.  Gastrointestinal: Negative for abdominal pain, constipation, diarrhea, nausea and vomiting.  Genitourinary: Negative for dysuria and flank pain.  Musculoskeletal: Negative for arthralgias, back pain, gait problem and neck pain.  Skin: Negative for color change.  Allergic/Immunologic: Negative for environmental allergies and food  allergies.  Neurological: Negative for dizziness and headaches.  Hematological: Does not bruise/bleed easily.  Psychiatric/Behavioral: Negative for agitation, behavioral problems (depression) and hallucinations.     Vital Signs: BP (!) 131/51 (BP Location: Right Arm, Patient Position: Sitting, Cuff Size: Normal)   Pulse 71   Resp 16   Ht 5\' 2"  (1.575 m)   Wt 133 lb 12.8 oz (60.7 kg)   SpO2 95%   BMI 24.47 kg/m    Physical Exam  Constitutional: She is oriented to person, place, and time. She appears well-developed and well-nourished. No distress.  HENT:  Head: Normocephalic and atraumatic.  Mouth/Throat: Oropharynx is clear and moist. No oropharyngeal exudate.  Eyes: Pupils are equal, round, and reactive to light. EOM are normal.  Neck: Normal range of motion. Neck supple. No JVD present. No tracheal deviation present. No thyromegaly present.  Cardiovascular: Normal rate, regular rhythm and normal heart sounds. Exam reveals no gallop and no friction rub.  No murmur heard. Pulmonary/Chest: Effort normal. No respiratory distress. She has no wheezes. She has no rales. She exhibits no tenderness. Left breast exhibits no mass. No breast swelling or tenderness.  Abdominal: Soft. Bowel sounds are normal.  Musculoskeletal: Normal range of motion.  Lymphadenopathy:    She has no cervical adenopathy.  Neurological: She is alert and oriented to person, place, and time. No cranial nerve deficit.  Skin: Skin is warm and dry. She is not diaphoretic.  Psychiatric: She has a normal mood and affect. Her behavior is normal. Judgment and thought content normal.     LABS: Recent Results (from the past 2160 hour(s))  UA/M w/rflx Culture, Routine     Status: Abnormal   Collection Time: 06/30/18 11:53 AM  Result Value Ref Range   Specific Gravity, UA 1.016 1.005 - 1.030   pH, UA 5.0 5.0 - 7.5   Color, UA Yellow Yellow   Appearance Ur Clear Clear   Leukocytes, UA 2+ (A) Negative   Protein, UA  Negative Negative/Trace   Glucose, UA Negative Negative   Ketones, UA Negative Negative   RBC, UA Negative Negative   Bilirubin, UA Negative Negative   Urobilinogen, Ur 0.2 0.2 - 1.0 mg/dL   Nitrite, UA Negative Negative   Microscopic Examination See below:     Comment: Microscopic was indicated and was performed.   Urinalysis Reflex Comment     Comment: This specimen has reflexed to a Urine Culture.  Microscopic Examination     Status: Abnormal   Collection Time: 06/30/18 11:53 AM  Result Value Ref Range   WBC, UA 11-30 (A) 0 - 5 /hpf   RBC, UA 0-2 0 - 2 /hpf   Epithelial Cells (non renal) 0-10 0 - 10 /hpf   Casts Present (A) None seen /lpf   Cast Type Hyaline casts N/A   Mucus,  UA Present Not Estab.   Bacteria, UA Few None seen/Few  Urine Culture, Reflex     Status: Abnormal   Collection Time: 06/30/18 11:53 AM  Result Value Ref Range   Urine Culture, Routine Final report (A)    Organism ID, Bacteria Escherichia coli (A)     Comment: 10,000-25,000 colony forming units per mL Cefazolin <=4 ug/mL Cefazolin with an MIC <=16 predicts susceptibility to the oral agents cefaclor, cefdinir, cefpodoxime, cefprozil, cefuroxime, cephalexin, and loracarbef when used for therapy of uncomplicated urinary tract infections due to E. coli, Klebsiella pneumoniae, and Proteus mirabilis.    Antimicrobial Susceptibility Comment     Comment:       ** S = Susceptible; I = Intermediate; R = Resistant **                    P = Positive; N = Negative             MICS are expressed in micrograms per mL    Antibiotic                 RSLT#1    RSLT#2    RSLT#3    RSLT#4 Amoxicillin/Clavulanic Acid    S Ampicillin                     S Cefepime                       S Ceftriaxone                    S Cefuroxime                     S Ciprofloxacin                  S Ertapenem                      S Gentamicin                     S Imipenem                       S Levofloxacin                    S Meropenem                      S Nitrofurantoin                 S Piperacillin/Tazobactam        S Tetracycline                   S Tobramycin                     S Trimethoprim/Sulfa             S   POCT HgB A1C     Status: Abnormal   Collection Time: 06/30/18 11:58 AM  Result Value Ref Range   Hemoglobin A1C 6.7 (A) 4.0 - 5.6 %   HbA1c POC (<> result, manual entry)     HbA1c, POC (prediabetic range)     HbA1c, POC (controlled diabetic range)       Assessment/Plan: 1. Encounter for general adult medical examination with abnormal findings - PHM is updated   2. Uncontrolled type  2 diabetes mellitus with hyperglycemia ([place]) - Improving  - POCT HgB A1C - Ambulatory referral to Ophthalmology - Ambulatory referral to Podiatry  3. Breast screening - MM DIGITAL SCREENING BILATERAL; Future  4. Dysuria - UA/M w/rflx Culture, Routine  5. Late onset Alzheimer's disease without behavioral disturbance - Continue to see Neurology   6. Essential hypertension, benign - Monitor for now   General Counseling: Noelia verbalizes understanding of the findings of todays visit and agrees with plan of treatment. I have discussed any further diagnostic evaluation that may be needed or ordered today. We also reviewed her medications today. she has been encouraged to call the office with any questions or concerns that should arise related to todays visit.  Orders Placed This Encounter  Procedures  . Microscopic Examination  . Urine Culture, Reflex  . MM DIGITAL SCREENING BILATERAL  . UA/M w/rflx Culture, Routine  . Ambulatory referral to Ophthalmology  . Ambulatory referral to Podiatry  . POCT HgB A1C     Time spent:25 [place], MD  Internal Medicine

## 2018-07-03 LAB — UA/M W/RFLX CULTURE, ROUTINE
BILIRUBIN UA: NEGATIVE
GLUCOSE, UA: NEGATIVE
Ketones, UA: NEGATIVE
NITRITE UA: NEGATIVE
Protein, UA: NEGATIVE
RBC UA: NEGATIVE
SPEC GRAV UA: 1.016 (ref 1.005–1.030)
UUROB: 0.2 mg/dL (ref 0.2–1.0)
pH, UA: 5 (ref 5.0–7.5)

## 2018-07-03 LAB — MICROSCOPIC EXAMINATION

## 2018-07-03 LAB — URINE CULTURE, REFLEX

## 2018-07-07 ENCOUNTER — Telehealth: Payer: Self-pay

## 2018-07-07 ENCOUNTER — Other Ambulatory Visit: Payer: Self-pay

## 2018-07-07 MED ORDER — CIPROFLOXACIN HCL 500 MG PO TABS: ORAL_TABLET | ORAL | 0 refills | Status: DC

## 2018-07-08 ENCOUNTER — Ambulatory Visit: Payer: Self-pay | Admitting: Internal Medicine

## 2018-07-15 ENCOUNTER — Other Ambulatory Visit: Payer: Self-pay | Admitting: Internal Medicine

## 2018-07-15 ENCOUNTER — Encounter: Payer: Self-pay | Admitting: Internal Medicine

## 2018-07-16 ENCOUNTER — Other Ambulatory Visit: Payer: Self-pay | Admitting: Nurse Practitioner

## 2018-07-16 DIAGNOSIS — G301 Alzheimer's disease with late onset: Principal | ICD-10-CM

## 2018-07-16 DIAGNOSIS — F0280 Dementia in other diseases classified elsewhere without behavioral disturbance: Secondary | ICD-10-CM

## 2018-07-16 MED ORDER — CIPROFLOXACIN HCL 500 MG PO TABS: ORAL_TABLET | ORAL | 0 refills | Status: DC

## 2018-07-16 MED ORDER — MEMANTINE HCL 5 MG PO TABS: 10.0000 mg | ORAL_TABLET | Freq: Two times a day (BID) | ORAL | 5 refills | Status: DC

## 2018-07-16 NOTE — Progress Notes (Signed)
Refilled patient's memantine per request. New prescription sent to [facility] on [address].

## 2018-07-18 ENCOUNTER — Emergency Department
Admission: EM | Admit: 2018-07-18 | Discharge: 2018-07-18 | Disposition: A | Discharge status: Home / Self Care | Payer: Medicare Other | Attending: Emergency Medicine | Admitting: Emergency Medicine

## 2018-07-18 ENCOUNTER — Other Ambulatory Visit: Payer: Self-pay

## 2018-07-18 ENCOUNTER — Encounter: Payer: Self-pay | Admitting: Emergency Medicine

## 2018-07-18 ENCOUNTER — Emergency Department: Payer: Medicare Other

## 2018-07-18 DIAGNOSIS — J449 Chronic obstructive pulmonary disease, unspecified: Secondary | ICD-10-CM | POA: Diagnosis not present

## 2018-07-18 DIAGNOSIS — Z7982 Long term (current) use of aspirin: Secondary | ICD-10-CM | POA: Diagnosis not present

## 2018-07-18 DIAGNOSIS — M6520 Calcific tendinitis, unspecified site: Secondary | ICD-10-CM | POA: Diagnosis not present

## 2018-07-18 DIAGNOSIS — Z79899 Other long term (current) drug therapy: Secondary | ICD-10-CM | POA: Diagnosis not present

## 2018-07-18 DIAGNOSIS — Y999 Unspecified external cause status: Secondary | ICD-10-CM | POA: Insufficient documentation

## 2018-07-18 DIAGNOSIS — S99911A Unspecified injury of right ankle, initial encounter: Secondary | ICD-10-CM | POA: Diagnosis not present

## 2018-07-18 DIAGNOSIS — C3490 Malignant neoplasm of unspecified part of unspecified bronchus or lung: Secondary | ICD-10-CM | POA: Diagnosis not present

## 2018-07-18 DIAGNOSIS — N182 Chronic kidney disease, stage 2 (mild): Secondary | ICD-10-CM | POA: Insufficient documentation

## 2018-07-18 DIAGNOSIS — Y9301 Activity, walking, marching and hiking: Secondary | ICD-10-CM | POA: Insufficient documentation

## 2018-07-18 DIAGNOSIS — M7989 Other specified soft tissue disorders: Secondary | ICD-10-CM | POA: Diagnosis not present

## 2018-07-18 DIAGNOSIS — I129 Hypertensive chronic kidney disease with stage 1 through stage 4 chronic kidney disease, or unspecified chronic kidney disease: Secondary | ICD-10-CM | POA: Insufficient documentation

## 2018-07-18 DIAGNOSIS — M7530 Calcific tendinitis of unspecified shoulder: Secondary | ICD-10-CM | POA: Diagnosis not present

## 2018-07-18 DIAGNOSIS — S79911A Unspecified injury of right hip, initial encounter: Secondary | ICD-10-CM | POA: Diagnosis not present

## 2018-07-18 DIAGNOSIS — E039 Hypothyroidism, unspecified: Secondary | ICD-10-CM | POA: Insufficient documentation

## 2018-07-18 DIAGNOSIS — M25511 Pain in right shoulder: Secondary | ICD-10-CM | POA: Diagnosis not present

## 2018-07-18 DIAGNOSIS — W0110XA Fall on same level from slipping, tripping and stumbling with subsequent striking against unspecified object, initial encounter: Secondary | ICD-10-CM | POA: Insufficient documentation

## 2018-07-18 DIAGNOSIS — E119 Type 2 diabetes mellitus without complications: Secondary | ICD-10-CM | POA: Insufficient documentation

## 2018-07-18 DIAGNOSIS — M25551 Pain in right hip: Secondary | ICD-10-CM | POA: Diagnosis not present

## 2018-07-18 DIAGNOSIS — W19XXXA Unspecified fall, initial encounter: Secondary | ICD-10-CM

## 2018-07-18 DIAGNOSIS — R51 Headache: Secondary | ICD-10-CM | POA: Diagnosis present

## 2018-07-18 DIAGNOSIS — Y92512 Supermarket, store or market as the place of occurrence of the external cause: Secondary | ICD-10-CM | POA: Diagnosis not present

## 2018-07-18 DIAGNOSIS — M25571 Pain in right ankle and joints of right foot: Secondary | ICD-10-CM | POA: Diagnosis not present

## 2018-07-18 DIAGNOSIS — S4991XA Unspecified injury of right shoulder and upper arm, initial encounter: Secondary | ICD-10-CM | POA: Diagnosis not present

## 2018-07-18 DIAGNOSIS — S0990XA Unspecified injury of head, initial encounter: Secondary | ICD-10-CM | POA: Diagnosis not present

## 2018-07-18 DIAGNOSIS — Z87891 Personal history of nicotine dependence: Secondary | ICD-10-CM | POA: Insufficient documentation

## 2018-07-18 NOTE — ED Triage Notes (Signed)
Pt tripped over something in the aisle at [employer] just prior to arrival; pt says she fell back onto her right side and hit the side of her head; c/o pain to right ankle, right hip, right shoulder and right side of her head, which she hit on the floor; mild swelling to ankle; ambulatory since fall; pt lifting right arm without difficulty; no swelling to right side of her head; pt takes Aspirin 325mg  daily

## 2018-07-18 NOTE — ED Provider Notes (Signed)
[facility] Emergency Department Provider Note  ____________________________________________   First MD Initiated Contact with Patient 07/18/18 2252     (approximate)  I have reviewed the triage vital signs and the nursing notes.   HISTORY  Chief Complaint Fall   HPI Samantha Franco is a 84 y.o. female who self presents to the emergency department after mechanical fall.  She was at a [employer] when she walked backwards and tripped over a box on the ground and fell onto her right side striking the back of her head.  She was able to get up immediately and ambulate.  No loss of consciousness.  She does take aspirin daily.  No chest pain or shortness of breath.  No elbow pain nausea or vomiting.  No double vision or blurred vision.  Her pain was sudden onset moderate severity and gradually improving with time.  She does not want to take any pain medication here in the emergency department and only wants to know what dose of Tylenol is safe at home.    Past Medical History:  Diagnosis Date  . Arthritis   . Chorea   . COPD (chronic obstructive pulmonary disease) ([place])   . Diabetes mellitus without complication ([place])   . Ear infection   . Environmental allergies   . GERD (gastroesophageal reflux disease)   . Hx of rheumatic fever   . Hx: UTI (urinary tract infection)   . Hyperlipidemia   . Hyperlipidemia   . Hypertension   . Hypothyroidism   . Kidney disease   . Lung cancer ([place])   . Mini stroke ([place])   . Stroke ([place])    x's 2  . Vertigo     Patient Active Problem List   Diagnosis Date Noted  . Bursitis of hip 12/17/2017  . Osteoarthritis of knee 12/17/2017  . Primary localized osteoarthritis of pelvic region and thigh 12/17/2017  . Cervical spondylosis with myelopathy 07/23/2017  . Closed nondisplaced fracture of second cervical vertebra with routine healing 07/23/2017  . CAP (community acquired pneumonia) 11/11/2015  . Pain in the  chest 03/21/2015  . Essential hypertension 03/21/2015  . Malignant neoplasm of unspecified part of unspecified bronchus or lung ([place]) 02/14/2014  . Chronic cystitis 08/05/2013  . Chronic kidney disease, stage II (mild) 08/05/2013  . Incomplete emptying of bladder 08/05/2013  . Kidney stone 08/05/2013  . Microscopic hematuria 08/05/2013  . Mixed incontinence 08/05/2013  . Uncertain tumor of kidney and ureter 08/05/2013    Past Surgical History:  Procedure Laterality Date  . CARDIAC CATHETERIZATION     [facility]  . CATARACT EXTRACTION    . LUNG LOBECTOMY    . THYROID SURGERY      Prior to Admission medications   Medication Sig Start Date End Date Taking? Authorizing Provider  allopurinol (ZYLOPRIM) 100 MG tablet Take one and one-half tablets at night  For gout 11/27/17   Ronnell Freshwater, NP  ALPRAZolam Duanne Moron) 0.25 MG tablet Take 1 tablet (0.25 mg total) by mouth at bedtime as needed for anxiety. 05/06/18   Lavera Guise, MD  amLODipine (NORVASC) 5 MG tablet Take 1 tablet (5 mg total) by mouth daily. 05/08/18   Lavera Guise, MD  aspirin 325 MG tablet Take 325 mg by mouth daily.    [provider]  bisoprolol-hydrochlorothiazide (ZIAC) 5-6.25 MG tablet TAKE 1 TABLET BY MOUTH ONCE DAILY 05/08/18   Ronnell Freshwater, NP  bisoprolol-hydrochlorothiazide (ZIAC) 5-6.25 MG tablet Take 1  tablet by mouth daily. 05/08/18   Lavera Guise, MD  ciprofloxacin (CIPRO) 500 MG tablet Take one tablet by mouth twice daily for 10 days. 07/16/18   Ronnell Freshwater, NP  citalopram (CELEXA) 40 MG tablet Take 40 mg by mouth daily.     [provider]  Continuous Blood Gluc Sensor MISC Use as directed every 14 days. May dispense FreeStyle [employer] or similar. diag E11.65 04/24/18   Lavera Guise, MD  diphenhydrAMINE (BENADRYL) 25 mg capsule Take 1 capsule (25 mg total) by mouth every 4 (four) hours as needed. 10/06/17 10/06/18  Eula Listen, MD  docusate sodium (COLACE) 100 MG  capsule Take 100 mg by mouth daily.    [provider]  donepezil (ARICEPT) 5 MG tablet Take 1 tablet (5 mg total) by mouth at bedtime. 12/17/17   Lavera Guise, MD  famotidine (PEPCID) 40 MG tablet Take 1 tablet (40 mg total) by mouth daily. 10/06/17 10/06/18  Eula Listen, MD  glucose blood (ACCU-CHEK AVIVA PLUS) test strip USE 1 STRIP TO CHECK GLUCOSE TWICE DAILY diag E11.65 04/14/18   Ronnell Freshwater, NP  LEVEMIR FLEXTOUCH 100 UNIT/ML Pen Inject 15 Units into the skin daily. 09/20/17   [provider]  levothyroxine (SYNTHROID, LEVOTHROID) 75 MCG tablet TAKE 1 TABLET BY MOUTH ONCE DAILY 04/14/18   Lavera Guise, MD  memantine [facility]) 5 MG tablet Take 2 tablets (10 mg total) by mouth 2 (two) times daily. 07/16/18   Ronnell Freshwater, NP  traMADol (ULTRAM) 50 MG tablet Take 1 tablet (50 mg total) by mouth every 8 (eight) hours as needed for moderate pain. 12/01/17   Ronnell Freshwater, NP  zolpidem (AMBIEN) 5 MG tablet Take 5 mg by mouth at bedtime.     [provider]    Allergies Sulfa antibiotics and Vasotec [enalapril]  Family History  Problem Relation Age of Onset  . Heart attack Father   . Heart disease Father     Social History Social History   Tobacco Use  . Smoking status: Former Smoker    Types: Cigarettes  . Smokeless tobacco: Never Used  . Tobacco comment: Quit X30 years ago  Substance Use Topics  . Alcohol use: No  . Drug use: No    Review of Systems Constitutional: No fever/chills Eyes: No visual changes. ENT: No sore throat. Cardiovascular: Denies chest pain. Respiratory: Denies shortness of breath. Gastrointestinal: No abdominal pain.  No nausea, no vomiting.  No diarrhea.  No constipation. Genitourinary: Negative for dysuria. Musculoskeletal: Positive for shoulder pain positive for hip pain positive for ankle pain Skin: Negative for rash. Neurological: Positive for  headache   ____________________________________________   PHYSICAL EXAM:  VITAL SIGNS: ED Triage Vitals  Enc Vitals Group     BP 07/18/18 2113 (!) 134/114     Pulse Rate 07/18/18 2113 69     Resp 07/18/18 2113 16     Temp 07/18/18 2113 98.5 F (36.9 C)     Temp Source 07/18/18 2113 Oral     SpO2 07/18/18 2113 97 %     Weight 07/18/18 2114 133 lb 13.1 oz (60.7 kg)     Height 07/18/18 2114 5\' 3"  (1.6 m)     Head Circumference --      Peak Flow --      Pain Score 07/18/18 2121 6     Pain Loc --      Pain Edu? --  Excl. in [place]? --     Constitutional: Alert and oriented x4 joking laughing pleasant cooperative speaks full clear sentences no diaphoresis Eyes: PERRL EOMI. midrange and brisk Head: Atraumatic. Nose: No congestion/rhinnorhea. Mouth/Throat: No trismus Neck: No stridor.  No midline tenderness or step-offs Cardiovascular: Normal rate, regular rhythm. Grossly normal heart sounds.  Good peripheral circulation. Respiratory: Normal respiratory effort.  No retractions. Lungs CTAB and moving good air Gastrointestinal: Soft nontender Musculoskeletal: No lower extremity edema   No bony tenderness.  Full range of motion all 4 extremities Neurologic:  Normal speech and language. No gross focal neurologic deficits are appreciated. Skin:  Skin is warm, dry and intact. No rash noted. Psychiatric: Mood and affect are normal. Speech and behavior are normal.    ____________________________________________   DIFFERENTIAL includes but not limited to  Mechanical fall, syncope, intracerebral hemorrhage, cervical spine fracture, hip fracture, ankle fracture, knee fracture ____________________________________________   LABS (all labs ordered are listed, but only abnormal results are displayed)  Labs Reviewed - No data to display   __________________________________________  EKG   ____________________________________________  RADIOLOGY  CT reviewed by me with no  acute disease X-ray of the right ankle right shoulder right hip reviewed by me with chronic changes but no acute disease ____________________________________________   PROCEDURES  Procedure(s) performed: no  Procedures  Critical Care performed: no  ____________________________________________   INITIAL IMPRESSION / ASSESSMENT AND PLAN / ED COURSE  Pertinent labs & imaging results that were available during my care of the patient were reviewed by me and considered in my medical decision making (see chart for details).   As part of my medical decision making, I reviewed the following data within the [facility] History obtained from family if available, nursing notes, old chart and ekg, as well as notes from prior ED visits.  Patient is very well-appearing hemodynamically stable with an unremarkable exam.  She declines pain medication and is neurovascularly intact.  Imaging is negative.  She is able to get up and ambulate.  Will refer back to primary care.      ____________________________________________   FINAL CLINICAL IMPRESSION(S) / ED DIAGNOSES  Final diagnoses:  Accident due to mechanical fall without injury, initial encounter  Calcific tendonitis      NEW MEDICATIONS STARTED DURING THIS VISIT:  New Prescriptions   No medications on file     Note:  This document was prepared using Dragon voice recognition software and may include unintentional dictation errors.     Darel Hong, MD 07/18/18 [phone]

## 2018-07-18 NOTE — Discharge Instructions (Signed)
It is safe to take a total of 8 extra strength tylenol in a day.  Please follow up with your PMD as needed and return to the ED sooner for any concerns.  It was a pleasure to take care of you today, and thank you for coming to our emergency department.  If you have any questions or concerns before leaving please ask the nurse to grab me and I'm more than happy to go through your aftercare instructions again.  If you were prescribed any opioid pain medication today such as Norco, Vicodin, Percocet, morphine, hydrocodone, or oxycodone please make sure you do not drive when you are taking this medication as it can alter your ability to drive safely.  If you have any concerns once you are home that you are not improving or are in fact getting worse before you can make it to your follow-up appointment, please do not hesitate to call [phone] and come back for further evaluation.  Darel Hong, MD  Results for orders placed or performed in visit on 06/30/18  Microscopic Examination  Result Value Ref Range   WBC, UA 11-30 (A) 0 - 5 /hpf   RBC, UA 0-2 0 - 2 /hpf   Epithelial Cells (non renal) 0-10 0 - 10 /hpf   Casts Present (A) None seen /lpf   Cast Type Hyaline casts N/A   Mucus, UA Present Not Estab.   Bacteria, UA Few None seen/Few  Urine Culture, Reflex  Result Value Ref Range   Urine Culture, Routine Final report (A)    Organism ID, Bacteria Escherichia coli (A)    Antimicrobial Susceptibility Comment   UA/M w/rflx Culture, Routine  Result Value Ref Range   Specific Gravity, UA 1.016 1.005 - 1.030   pH, UA 5.0 5.0 - 7.5   Color, UA Yellow Yellow   Appearance Ur Clear Clear   Leukocytes, UA 2+ (A) Negative   Protein, UA Negative Negative/Trace   Glucose, UA Negative Negative   Ketones, UA Negative Negative   RBC, UA Negative Negative   Bilirubin, UA Negative Negative   Urobilinogen, Ur 0.2 0.2 - 1.0 mg/dL   Nitrite, UA Negative Negative   Microscopic Examination See below:    Urinalysis Reflex Comment   POCT HgB A1C  Result Value Ref Range   Hemoglobin A1C 6.7 (A) 4.0 - 5.6 %   HbA1c POC (<> result, manual entry)     HbA1c, POC (prediabetic range)     HbA1c, POC (controlled diabetic range)     Dg Shoulder Right  Result Date: 07/18/2018 CLINICAL DATA:  Shoulder pain after fall. EXAM: RIGHT SHOULDER - 2+ VIEW COMPARISON:  None. FINDINGS: Osteoarthritis of the [facility] joint. Soft tissue calcifications along the rotator cuff are identified consistent with calcific rotator cuff tendinopathy. No acute fracture of the proximal humerus. Intact glenohumeral joint. Intact adjacent ribs and lung. IMPRESSION: Osteoarthritis of the AC and glenohumeral joints with calcific rotator cuff tendinopathy suggested about the right shoulder. No acute osseous abnormality. Electronically Signed   By: Ashley Royalty M.D.   On: 07/18/2018 22:37   Dg Ankle 2 Views Right  Result Date: 07/18/2018 CLINICAL DATA:  Patient tripped at a store in presents with right ankle pain. EXAM: RIGHT ANKLE - 2 VIEW COMPARISON:  None. FINDINGS: Mild soft tissue swelling about the malleoli. Intact ankle and subtalar joints. Osteoarthritis across the talonavicular joint with dorsal spurring. Calcaneal enthesopathy is seen along the plantar dorsal aspect. Well corticated ossific densities project off the malleoli  and lateral talus likely related to old remote trauma. No acute displaced fracture is identified. No joint dislocation is seen. IMPRESSION: Soft tissue swelling about the malleoli. Age-indeterminate but likely remote posttraumatic ossified densities projecting off the malleoli and lateral talus. Electronically Signed   By: Ashley Royalty M.D.   On: 07/18/2018 22:32   Ct Head Wo Contrast  Result Date: 07/18/2018 CLINICAL DATA:  Patient tripped at a store and fell onto the right side, hitting the side of her head. Dizziness after fall. Head trauma. EXAM: CT HEAD WITHOUT CONTRAST TECHNIQUE: Contiguous axial images were  obtained from the base of the skull through the vertex without intravenous contrast. COMPARISON:  03/10/2017 MRI FINDINGS: Brain: Chronic minimal small vessel ischemic disease. Mild superficial and central atrophy. No acute intracranial hemorrhage, midline shift or edema. No large vascular territory infarction. Midline fourth ventricle and basal cisterns without effacement. Vascular: No hyperdense vessel or unexpected calcification. Skull: Normal. Negative for fracture or focal lesion. Sinuses/Orbits: No acute finding. Other: None IMPRESSION: Atrophy with chronic small vessel ischemia. No acute intracranial abnormality. Electronically Signed   By: Ashley Royalty M.D.   On: 07/18/2018 22:30   Dg Hip Unilat W Or Wo Pelvis 2-3 Views Right  Result Date: 07/18/2018 CLINICAL DATA:  Pain and swelling after fall. EXAM: DG HIP (WITH OR WITHOUT PELVIS) 2-3V RIGHT COMPARISON:  None. FINDINGS: Lower lumbar degenerative disc disease L1 through S1. Intact bony pelvis. Osteoarthritis of the SI joints and pubic symphysis. Degenerative joint space narrowing of both hips. No acute fracture nor joint dislocation. Rounded calcified density in the left hemipelvis may represent a diverticulum with contrast. IMPRESSION: 1. Lumbar spondylosis. 2. Osteoarthritis of the hips, sacroiliac joints and pubic symphysis. 3. No acute fracture nor joint dislocations Electronically Signed   By: Ashley Royalty M.D.   On: 07/18/2018 22:35

## 2018-07-20 ENCOUNTER — Encounter: Payer: Self-pay | Admitting: Podiatry

## 2018-07-20 ENCOUNTER — Ambulatory Visit (INDEPENDENT_AMBULATORY_CARE_PROVIDER_SITE_OTHER): Payer: Medicare Other | Admitting: Podiatry

## 2018-07-20 VITALS — BP 121/51 | HR 69

## 2018-07-20 DIAGNOSIS — S93601A Unspecified sprain of right foot, initial encounter: Secondary | ICD-10-CM

## 2018-07-20 DIAGNOSIS — M7671 Peroneal tendinitis, right leg: Secondary | ICD-10-CM

## 2018-07-20 NOTE — Progress Notes (Addendum)
This patient presents the office with chief complaint of an injury on the outside of her right foot. 3 days ago.  She says that she fell and went to the emergency room and x-rays were taken.  He says there is no break in the bone on  her  xray.  She says that her foot became swollen and purplish and she presents the office today for second opinion on this. Injured foot.  She presents to the office with a caretaker for this visit.  She says that her foot is not very painful and that she is having more pain all over her body than  in her foot. She originally had an appointment at this office for an extended exam of her swollen feet. This patient is a diabetic. She presents the office today for an evaluation of her right foot that was injured 3 days ago.     Vascular  Dorsalis pedis and posterior tibial pulses are weakly  palpable  B/L.  Capillary return  WNL.  Temperature gradient is  WNL.  Skin turgor  WNL  Sensorium  Senn Weinstein monofilament wire  WNL. Normal tactile sensation.  Nail Exam  Patient has normal nails with no evidence of bacterial infection.  Fungus fourth nail right foot.  Orthopedic  Exam  Muscle tone and muscle strength  WNL.  No limitations of motion feet  B/L.  No crepitus or joint effusion noted.  Foot type is unremarkable and digits show no abnormalities. Midfoot  DJD.  There is an enlargement noted at the lateral malleolus of the right ankle.  There appears to be no increased temperature or swelling at the site.  Palpable pain noted in the sinus tarsi of the right foot.    Skin  No open lesions.  Normal skin texture and turgor.  Foot Sprain right foot.  Sinus tarsitis right foot.  Peroneal tendinitis right ankle.  IE.  Reviewed the x-rays that were taken previously an revealed no evidence of any bony pathololgy.  The lateral malleolus, right foot is enlarged, possibly from previous injury.  There is calcification at the insertion of Achilles tendon, right foot.  The anterior  aspect of the calcaneus appears intact on the lateral x-ray.  Discussed this condition with this patient.  Since she says she has little pain in her right foot I suggested a compression  sock to help to support her right ankle and help limit swelling.  RTC prn   Gardiner Barefoot DPM

## 2018-07-24 DIAGNOSIS — G309 Alzheimer's disease, unspecified: Secondary | ICD-10-CM | POA: Diagnosis not present

## 2018-07-24 DIAGNOSIS — R9082 White matter disease, unspecified: Secondary | ICD-10-CM | POA: Diagnosis not present

## 2018-07-24 DIAGNOSIS — F0151 Vascular dementia with behavioral disturbance: Secondary | ICD-10-CM | POA: Diagnosis not present

## 2018-07-24 DIAGNOSIS — R0989 Other specified symptoms and signs involving the circulatory and respiratory systems: Secondary | ICD-10-CM | POA: Diagnosis not present

## 2018-07-26 ENCOUNTER — Other Ambulatory Visit: Payer: Self-pay | Admitting: Internal Medicine

## 2018-08-11 DIAGNOSIS — Z23 Encounter for immunization: Secondary | ICD-10-CM | POA: Diagnosis not present

## 2018-08-25 ENCOUNTER — Ambulatory Visit: Payer: Self-pay | Admitting: Adult Health

## 2018-08-26 ENCOUNTER — Ambulatory Visit: Payer: Self-pay | Admitting: Adult Health

## 2018-09-18 DIAGNOSIS — M1611 Unilateral primary osteoarthritis, right hip: Secondary | ICD-10-CM | POA: Diagnosis not present

## 2018-09-22 NOTE — Telephone Encounter (Signed)
done

## 2018-10-30 ENCOUNTER — Encounter: Payer: Self-pay | Admitting: Adult Health

## 2018-10-30 ENCOUNTER — Ambulatory Visit (INDEPENDENT_AMBULATORY_CARE_PROVIDER_SITE_OTHER): Payer: Medicare Other | Admitting: Adult Health

## 2018-10-30 VITALS — BP 120/78 | HR 68 | Resp 16 | Ht 62.0 in | Wt 136.0 lb

## 2018-10-30 DIAGNOSIS — K219 Gastro-esophageal reflux disease without esophagitis: Secondary | ICD-10-CM | POA: Diagnosis not present

## 2018-10-30 DIAGNOSIS — I10 Essential (primary) hypertension: Secondary | ICD-10-CM

## 2018-10-30 DIAGNOSIS — E1165 Type 2 diabetes mellitus with hyperglycemia: Secondary | ICD-10-CM

## 2018-10-30 DIAGNOSIS — E785 Hyperlipidemia, unspecified: Secondary | ICD-10-CM | POA: Diagnosis not present

## 2018-10-30 LAB — POCT GLYCOSYLATED HEMOGLOBIN (HGB A1C): Hemoglobin A1C: 6.4 % — AB (ref 4.0–5.6)

## 2018-10-30 NOTE — Progress Notes (Signed)
[facility] [address], [place] [address]  Internal MEDICINE  Office Visit Note  Patient Name: Samantha Franco  [id number]  [id number]  Date of Service: 11/03/2018  Chief Complaint  Patient presents with  . Hyperlipidemia  . Hypertension  . Diabetes  . Gastroesophageal Reflux    HPI  Pt is here for follow up on HLD, HTN, DM and GERD.  Pt's A1C today is 6.4 which is improved from 6.7.  She denies any issues taking her medications.  She has not any chest pain, shortness of breath, palpitations or any other issues.  She denies any GERD symptoms as of recent.  Generally she is doing well.   Current Medication: Outpatient Encounter Medications as of 10/30/2018  Medication Sig  . acetaminophen (TYLENOL) 325 MG tablet Take by mouth.  Marland Kitchen allopurinol (ZYLOPRIM) 100 MG tablet Take one and one-half tablets at night  For gout  . ALPRAZolam (XANAX) 0.25 MG tablet Take 1 tablet (0.25 mg total) by mouth at bedtime as needed for anxiety.  Marland Kitchen amLODipine (NORVASC) 5 MG tablet Take 1 tablet (5 mg total) by mouth daily.  Marland Kitchen amoxicillin (AMOXIL) 875 MG tablet amoxicillin 875 mg tablet  . aspirin 325 MG tablet Take 325 mg by mouth daily.  . bisoprolol-hydrochlorothiazide (ZIAC) 5-6.25 MG tablet TAKE 1 TABLET BY MOUTH ONCE DAILY  . bisoprolol-hydrochlorothiazide (ZIAC) 5-6.25 MG tablet Take 1 tablet by mouth daily.  Marland Kitchen buPROPion (WELLBUTRIN XL) 150 MG 24 hr tablet Take by mouth.  . ciprofloxacin (CIPRO) 500 MG tablet Take one tablet by mouth twice daily for 10 days.  . citalopram (CELEXA) 40 MG tablet Take 40 mg by mouth daily.   . Continuous Blood Gluc Sensor MISC Use as directed every 14 days. May dispense FreeStyle [employer] or similar. diag E11.65  . diclofenac sodium (VOLTAREN) 1 % GEL Voltaren 1 % topical gel  . docusate sodium (COLACE) 100 MG capsule Take 100 mg by mouth daily.  Marland Kitchen donepezil (ARICEPT) 5 MG tablet Take 1 tablet (5 mg total) by mouth at bedtime.  .  fluticasone (FLONASE) 50 MCG/ACT nasal spray fluticasone propionate 50 mcg/actuation nasal spray,suspension  . glipiZIDE (GLUCOTROL) 10 MG tablet glipizide 10 mg tablet  . glucose blood (ACCU-CHEK AVIVA PLUS) test strip USE 1 STRIP TO CHECK GLUCOSE TWICE DAILY diag E11.65  . insulin aspart protamine - aspart (NOVOLOG MIX 70/30 FLEXPEN) (70-30) 100 UNIT/ML FlexPen Novolog Mix 70-30 FlexPen U-100 Insulin 100 unit/mL subcutaneous pen  . LEVEMIR FLEXTOUCH 100 UNIT/ML Pen INJECT 10 TO 15 UNITS SUBCUTANEOUSLY EACH MORNING AS DIRECTED  . levofloxacin (LEVAQUIN) 500 MG tablet levofloxacin 500 mg tablet  . levothyroxine (SYNTHROID, LEVOTHROID) 75 MCG tablet TAKE 1 TABLET BY MOUTH ONCE DAILY  . liraglutide (VICTOZA) 18 MG/3ML SOPN Victoza 2-Pak 0.6 mg/0.1 mL (18 mg/3 mL) subcutaneous pen injector  . meclizine (ANTIVERT) 25 MG tablet meclizine 25 mg tablet  . meloxicam (MOBIC) 7.5 MG tablet meloxicam 7.5 mg tablet  . memantine (NAMENDA) 5 MG tablet Take 2 tablets (10 mg total) by mouth 2 (two) times daily.  . QUEtiapine (SEROQUEL) 25 MG tablet TAKE 1 TABLET BY MOUTH ONCE DAILY AT NIGHT  . traMADol (ULTRAM) 50 MG tablet Take 1 tablet (50 mg total) by mouth every 8 (eight) hours as needed for moderate pain.  Marland Kitchen zolpidem (AMBIEN) 5 MG tablet Take 5 mg by mouth at bedtime.   . diphenhydrAMINE (BENADRYL) 25 mg capsule Take 1 capsule (25 mg total) by mouth every 4 (  four) hours as needed.  . famotidine (PEPCID) 40 MG tablet Take 1 tablet (40 mg total) by mouth daily.   No facility-administered encounter medications on file as of 10/30/2018.     Surgical History: Past Surgical History:  Procedure Laterality Date  . CARDIAC CATHETERIZATION     [facility]  . CATARACT EXTRACTION    . LUNG LOBECTOMY    . THYROID SURGERY      Medical History: Past Medical History:  Diagnosis Date  . Arthritis   . Chorea   . COPD (chronic obstructive pulmonary disease) ([place])   . Diabetes mellitus without complication ([place])   .  Ear infection   . Environmental allergies   . GERD (gastroesophageal reflux disease)   . Hx of rheumatic fever   . Hx: UTI (urinary tract infection)   . Hyperlipidemia   . Hyperlipidemia   . Hypertension   . Hypothyroidism   . Kidney disease   . Lung cancer ([place])   . Mini stroke ([place])   . Stroke ([place])    x's 2  . Vertigo     Family History: Family History  Problem Relation Age of Onset  . Heart attack Father   . Heart disease Father     Social History   Socioeconomic History  . Marital status: Widowed    Spouse name: Not on file  . Number of children: Not on file  . Years of education: Not on file  . Highest education level: Not on file  Occupational History  . Not on file  Social Needs  . Financial resource strain: Not on file  . Food insecurity:    Worry: Not on file    Inability: Not on file  . Transportation needs:    Medical: Not on file    Non-medical: Not on file  Tobacco Use  . Smoking status: Former Smoker    Types: Cigarettes  . Smokeless tobacco: Never Used  . Tobacco comment: Quit X30 years ago  Substance and Sexual Activity  . Alcohol use: No  . Drug use: No  . Sexual activity: Not on file  Lifestyle  . Physical activity:    Days per week: Not on file    Minutes per session: Not on file  . Stress: Not on file  Relationships  . Social connections:    Talks on phone: Not on file    Gets together: Not on file    Attends religious service: Not on file    Active member of club or organization: Not on file    Attends meetings of clubs or organizations: Not on file    Relationship status: Not on file  . Intimate partner violence:    Fear of current or ex partner: Not on file    Emotionally abused: Not on file    Physically abused: Not on file    Forced sexual activity: Not on file  Other Topics Concern  . Not on file  Social History Narrative  . Not on file      Review of Systems  Constitutional: Negative for chills, fatigue and  unexpected weight change.  HENT: Negative for congestion, rhinorrhea, sneezing and sore throat.   Eyes: Negative for photophobia, pain and redness.  Respiratory: Negative for cough, chest tightness and shortness of breath.   Cardiovascular: Negative for chest pain and palpitations.  Gastrointestinal: Negative for abdominal pain, constipation, diarrhea, nausea and vomiting.  Endocrine: Negative.   Genitourinary: Negative for dysuria and frequency.  Musculoskeletal: Negative for  arthralgias, back pain, joint swelling and neck pain.  Skin: Negative for rash.  Allergic/Immunologic: Negative.   Neurological: Negative for tremors and numbness.  Hematological: Negative for adenopathy. Does not bruise/bleed easily.  Psychiatric/Behavioral: Negative for behavioral problems and sleep disturbance. The patient is not nervous/anxious.     Vital Signs: BP 120/78   Pulse 68   Resp 16   Ht 5\' 2"  (1.575 m)   Wt 136 lb (61.7 kg)   SpO2 97%   BMI 24.87 kg/m    Physical Exam Vitals signs and nursing note reviewed.  Constitutional:      General: She is not in acute distress.    Appearance: She is well-developed. She is not diaphoretic.  HENT:     Head: Normocephalic and atraumatic.     Mouth/Throat:     Pharynx: No oropharyngeal exudate.  Eyes:     Pupils: Pupils are equal, round, and reactive to light.  Neck:     Musculoskeletal: Normal range of motion and neck supple.     Thyroid: No thyromegaly.     Vascular: No JVD.     Trachea: No tracheal deviation.  Cardiovascular:     Rate and Rhythm: Normal rate and regular rhythm.     Heart sounds: Normal heart sounds. No murmur. No friction rub. No gallop.   Pulmonary:     Effort: Pulmonary effort is normal. No respiratory distress.     Breath sounds: Normal breath sounds. No wheezing or rales.  Chest:     Chest wall: No tenderness.  Abdominal:     Palpations: Abdomen is soft.     Tenderness: There is no abdominal tenderness. There is no  guarding.  Musculoskeletal: Normal range of motion.  Lymphadenopathy:     Cervical: No cervical adenopathy.  Skin:    General: Skin is warm and dry.  Neurological:     Mental Status: She is alert and oriented to person, place, and time.     Cranial Nerves: No cranial nerve deficit.  Psychiatric:        Behavior: Behavior normal.        Thought Content: Thought content normal.        Judgment: Judgment normal.    Assessment/Plan: 1. Uncontrolled type 2 diabetes mellitus with hyperglycemia ([place]) Patient stable but A1c improved to 6.1.  Patient is doing well and will continue current medications. - POCT HgB A1C  2. Essential hypertension, benign Stable, continue current medications as prescribed.  3. Hyperlipidemia, unspecified hyperlipidemia type Continue current management.  Patient will need lipid panel updated at next blood draw.  4. Gastroesophageal reflux disease without esophagitis Stable, continue current medications as prescribed.  General Counseling: joia doyle understanding of the findings of todays visit and agrees with plan of treatment. I have discussed any further diagnostic evaluation that may be needed or ordered today. We also reviewed her medications today. she has been encouraged to call the office with any questions or concerns that should arise related to todays visit.    Orders Placed This Encounter  Procedures  . POCT HgB A1C    No orders of the defined types were placed in this encounter.   Time spent: 25 Minutes   This patient was seen by Orson Gear AGNP-C in Collaboration with Dr Lavera Guise as a part of collaborative care agreement     Kendell Bane AGNP-C Internal medicine

## 2018-12-08 ENCOUNTER — Other Ambulatory Visit: Payer: Self-pay | Admitting: Internal Medicine

## 2018-12-26 IMAGING — CR DG SHOULDER 2+V*R*
1 series · 3 of 3 positions shown · non-contrast
Comparison: None.

CLINICAL DATA: Shoulder pain after fall.

EXAM:
RIGHT SHOULDER - 2+ VIEW

[Series 1: dg shoulder right · 0.14mm/px · 3 of 3 slices shown]
[im 1/3]
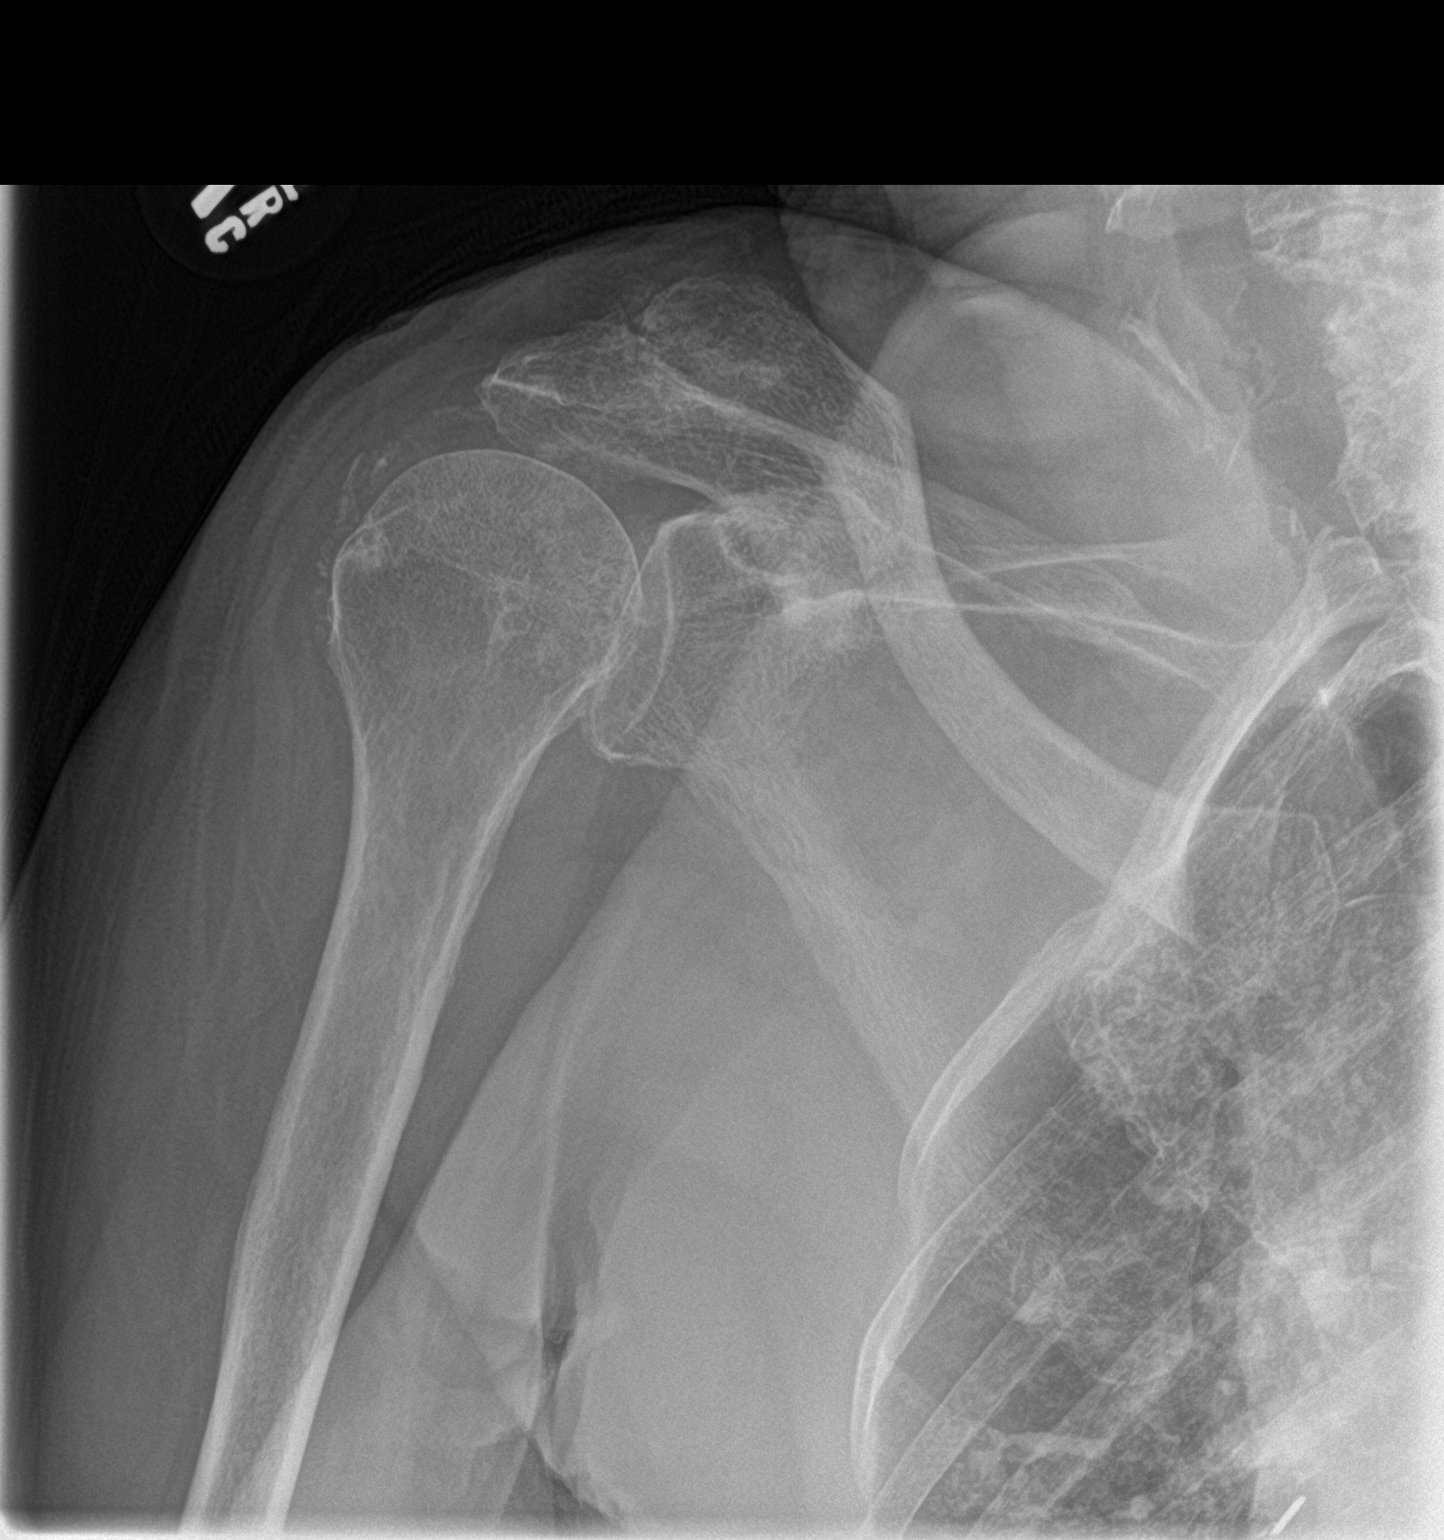
[im 2/3]
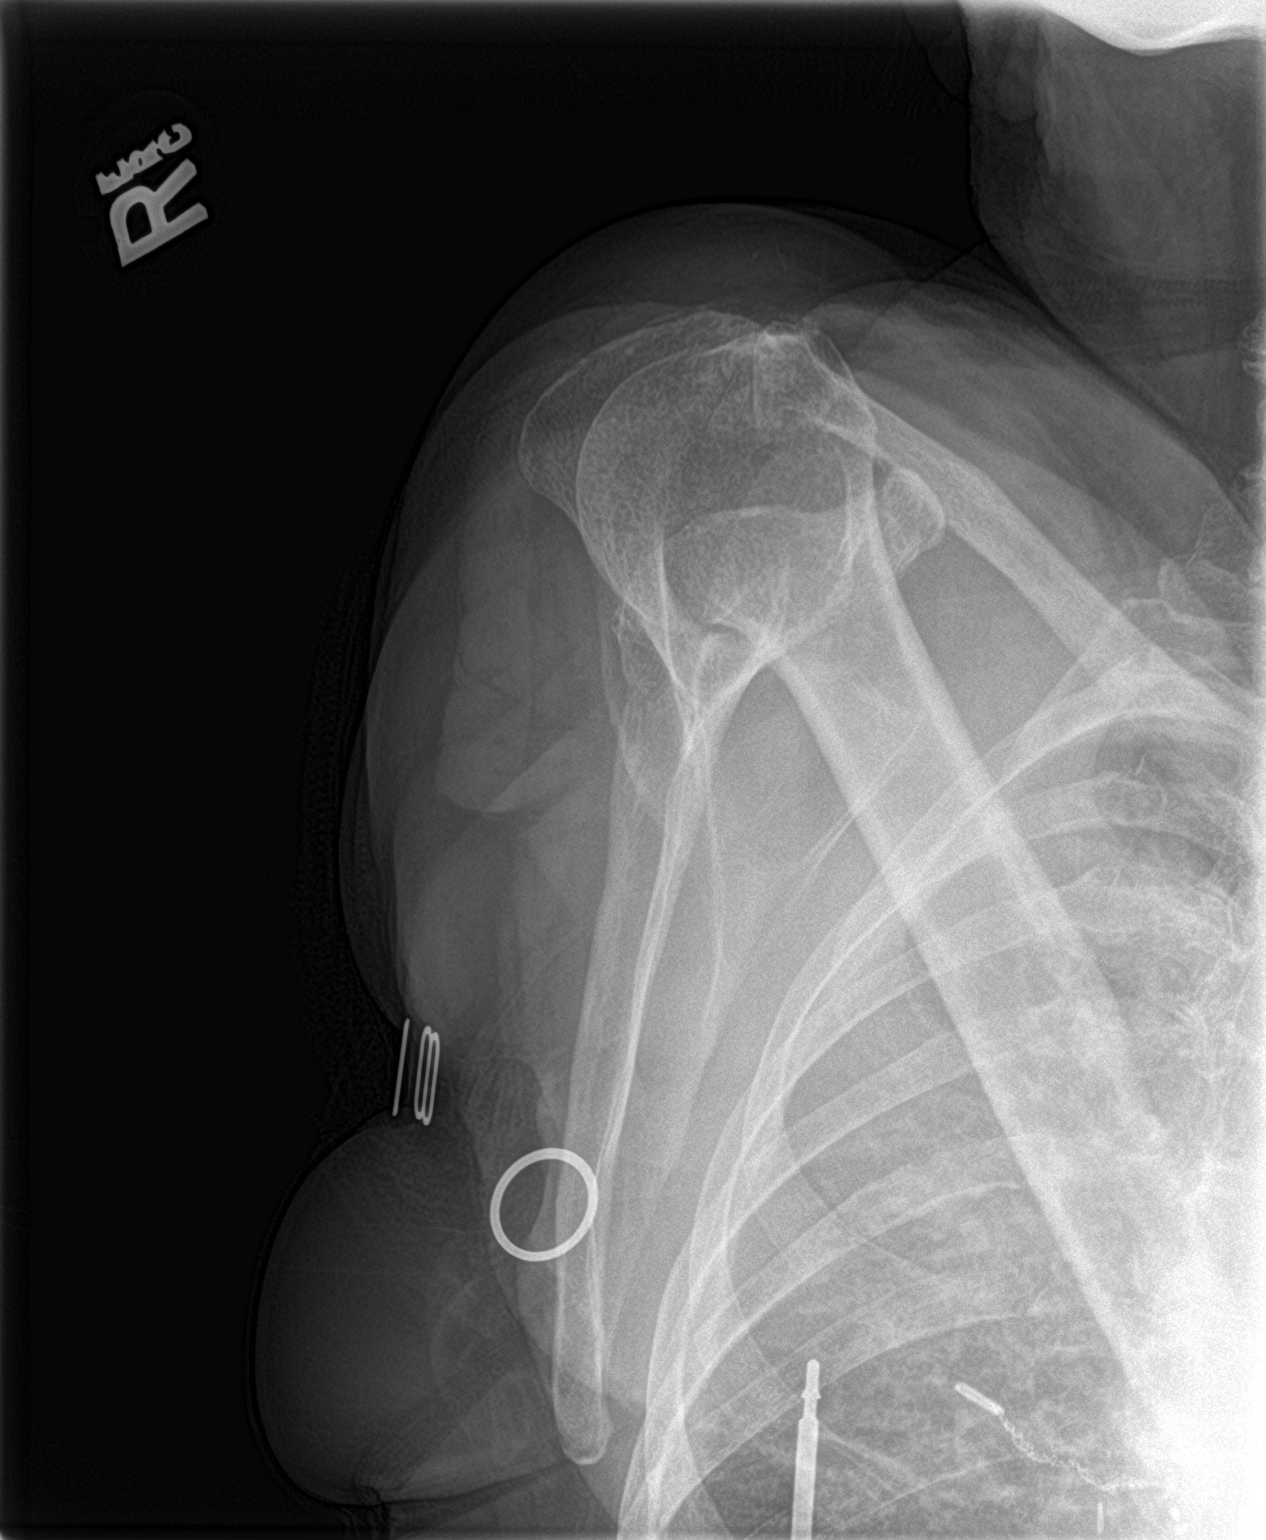
[im 3/3]
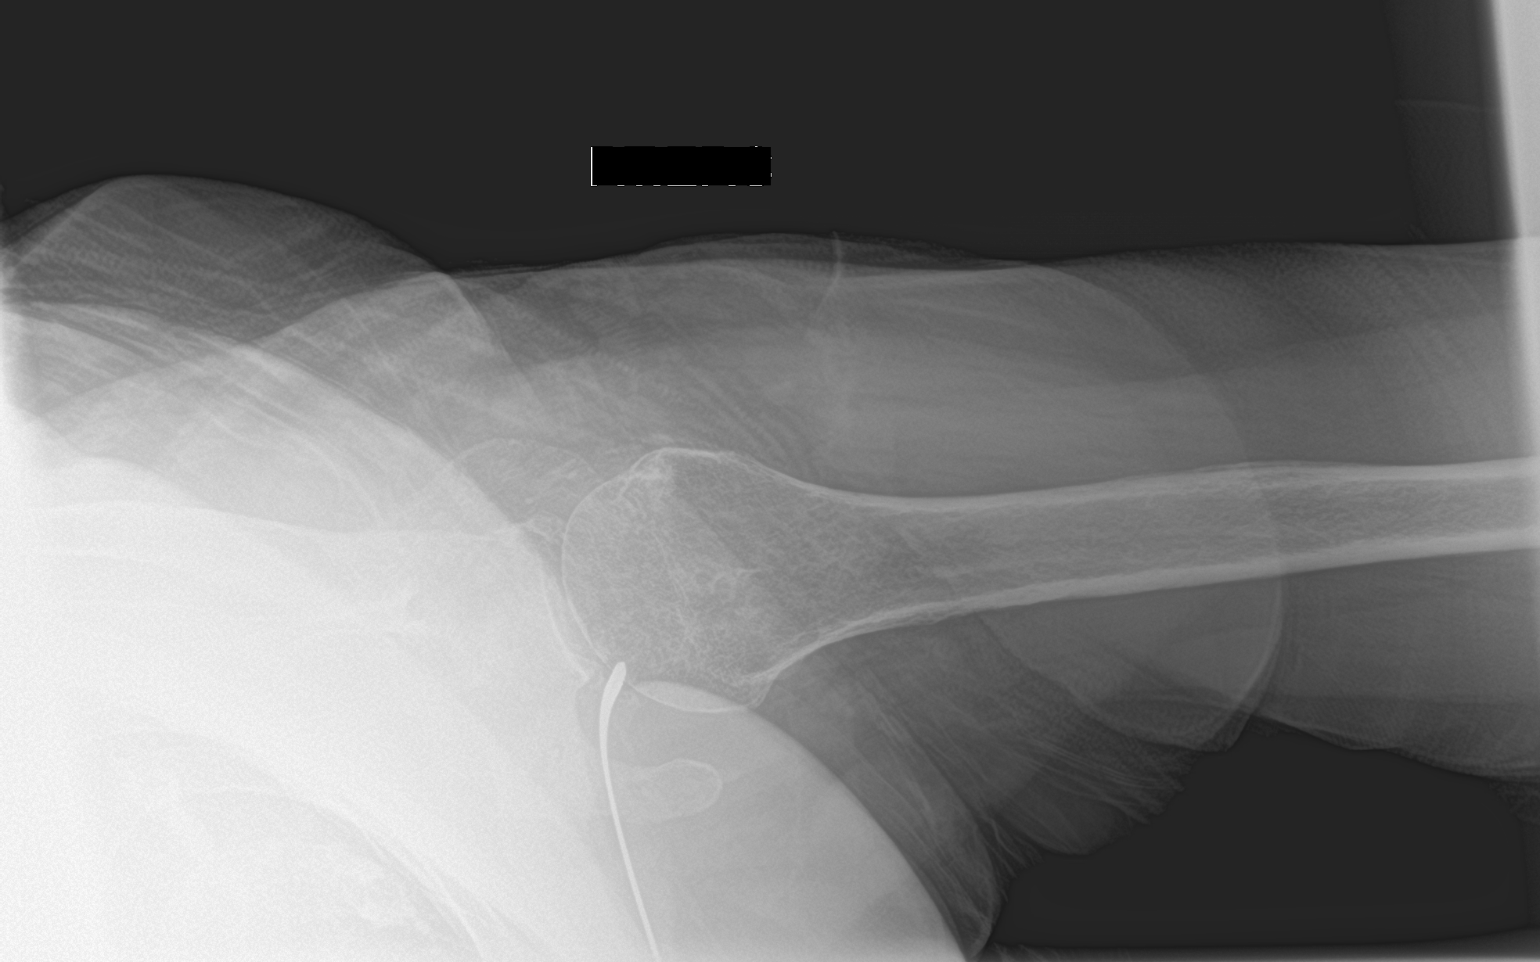

[3 of 3 positions shown; findings below may reference images not displayed]

FINDINGS: Osteoarthritis of the AC joint. Soft tissue calcifications along the
rotator cuff are identified consistent with calcific rotator cuff
tendinopathy. No acute fracture of the proximal humerus. Intact
glenohumeral joint. Intact adjacent ribs and lung.
IMPRESSION: Osteoarthritis of the AC and glenohumeral joints with calcific
rotator cuff tendinopathy suggested about the right shoulder. No
acute osseous abnormality.

## 2018-12-26 IMAGING — CT CT HEAD W/O CM
3 series · 16 of 45 positions shown, 19 images · non-contrast
Comparison: 03/10/2017 MRI

CLINICAL DATA: Patient tripped at a store and fell onto the right
side, hitting the side of her head. Dizziness after fall. Head
trauma.

EXAM:
CT HEAD WITHOUT CONTRAST
TECHNIQUE: Contiguous axial images were obtained from the base of the skull
through the vertex without intravenous contrast.

[Series 2: head wo · axial · 0.47mm/px · z∈[-127,-12]mm · 10 of 28 slices shown, 13 images]
[im 3/28  brain]
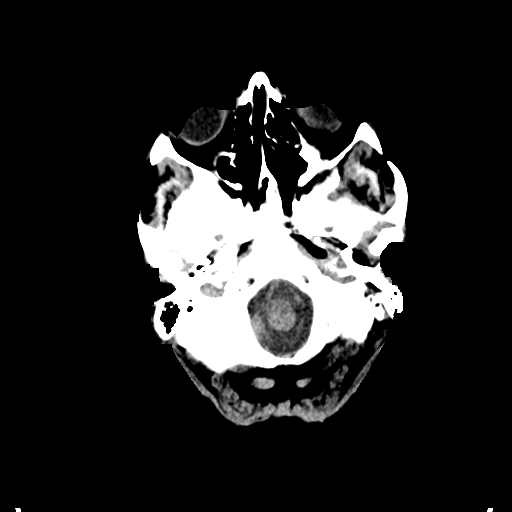
[im 3/28  bone]
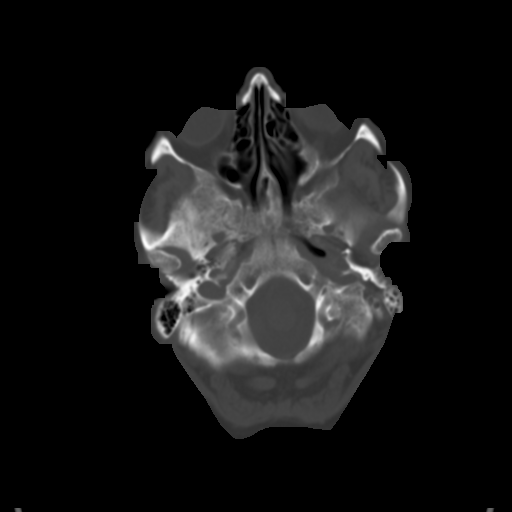
[im 5/28  brain]
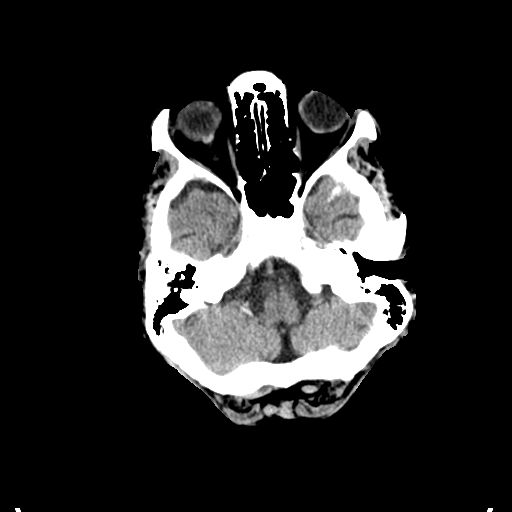
[im 8/28  brain]
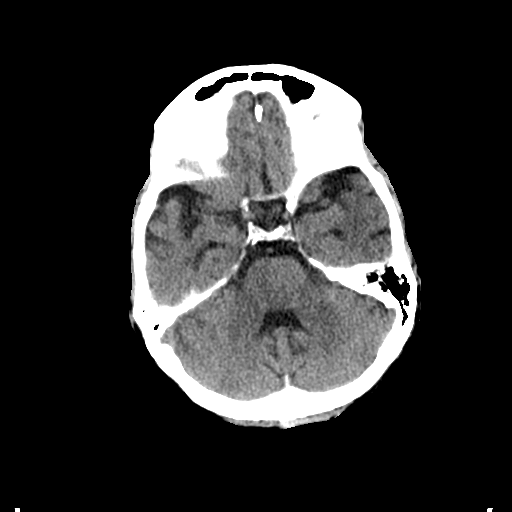
[im 11/28  brain]
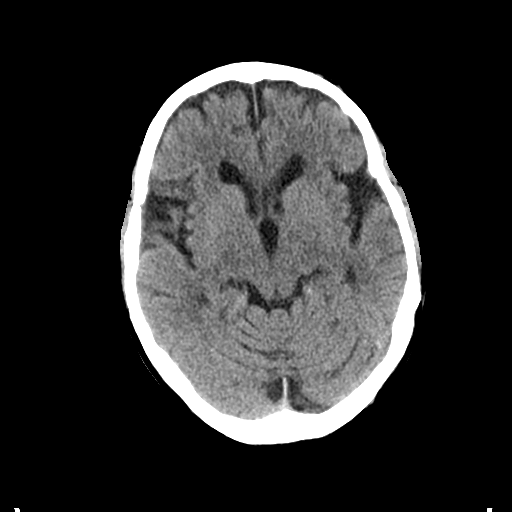
[im 13/28  brain]
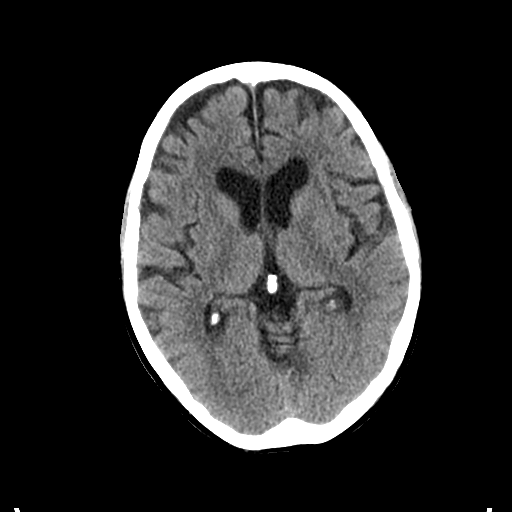
[im 13/28  bone]
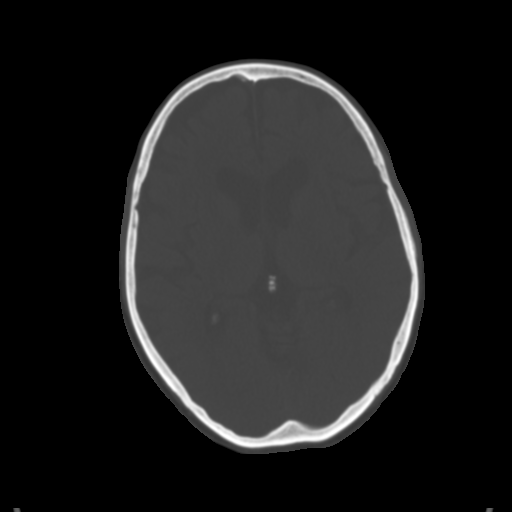
[im 16/28  brain]
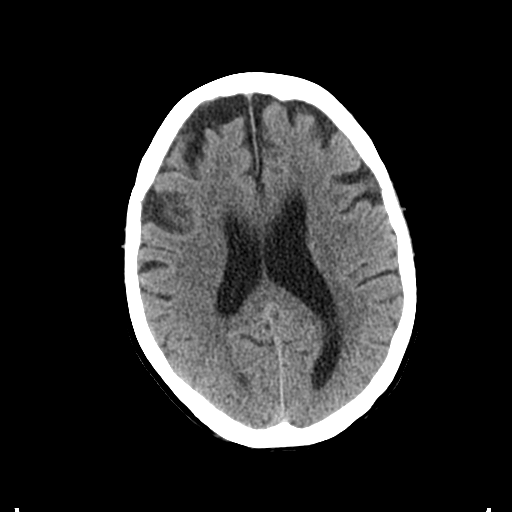
[im 18/28  brain]
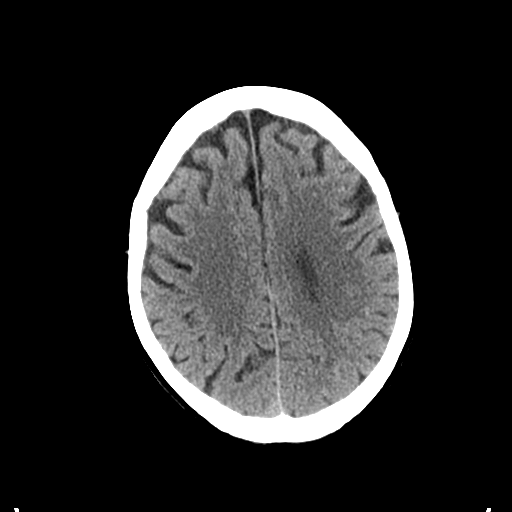
[im 21/28  brain]
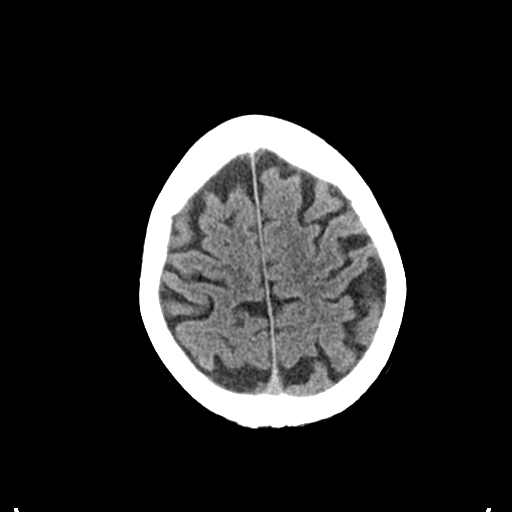
[im 24/28  brain]
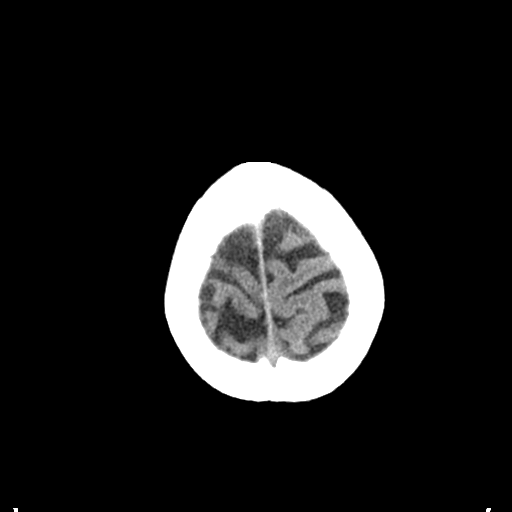
[im 24/28  bone]
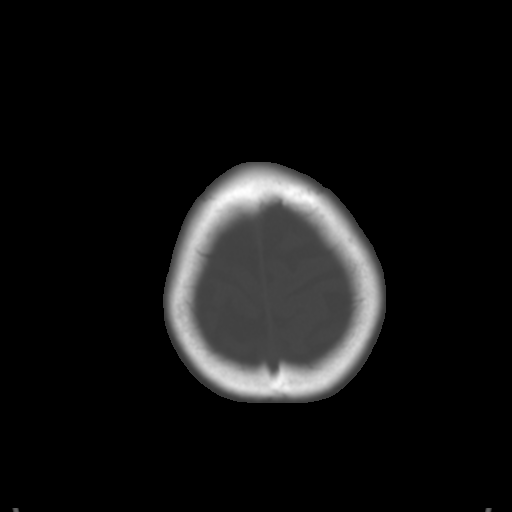
[im 26/28  brain]
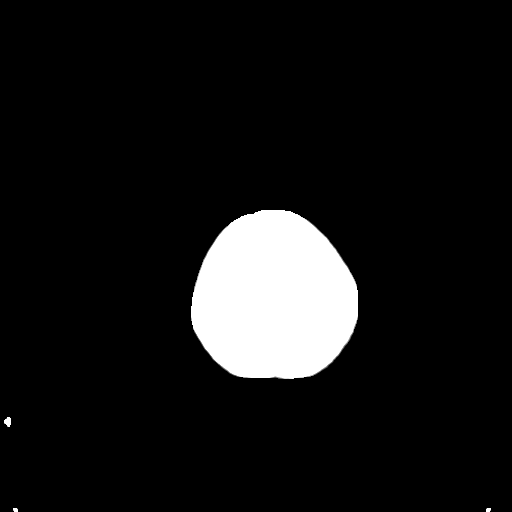

[Series 4: coronal soft tissue · coronal · 0.29mm/px · 3 of 63 slices shown]
[im 21/63  brain]
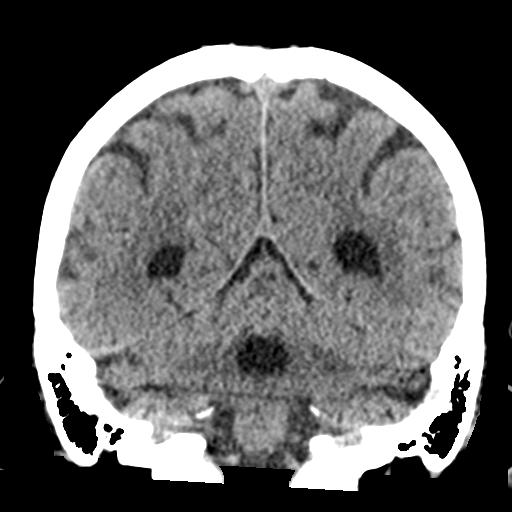
[im 28/63  brain]
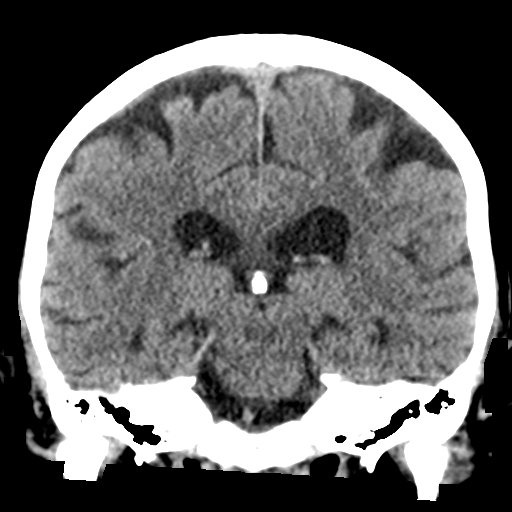
[im 35/63  brain]
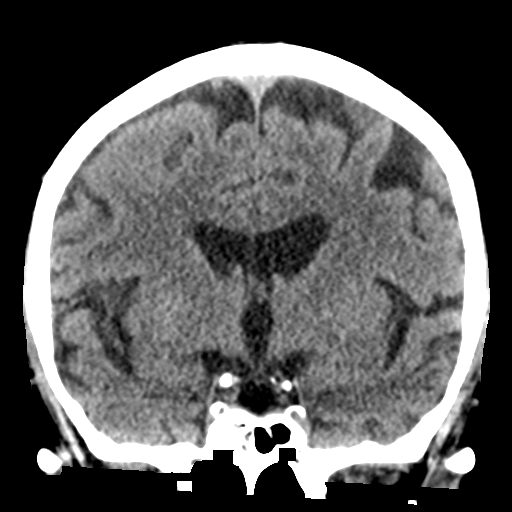

[Series 5: sagittal soft tissue · sagittal · 0.29mm/px · 3 of 54 slices shown]
[im 18/54  brain]
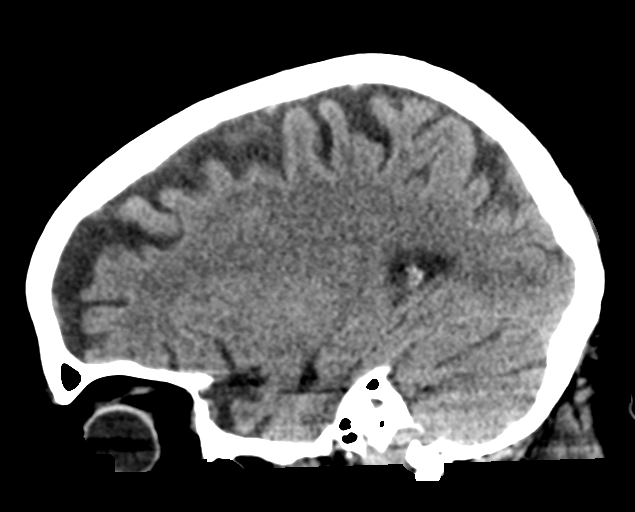
[im 27/54  brain]
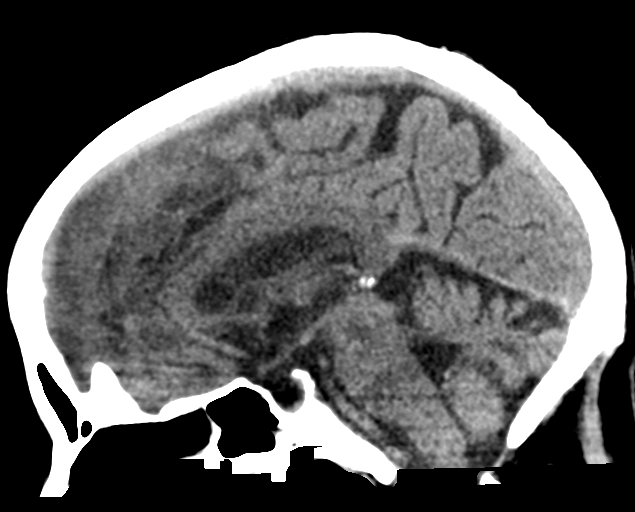
[im 36/54  brain]
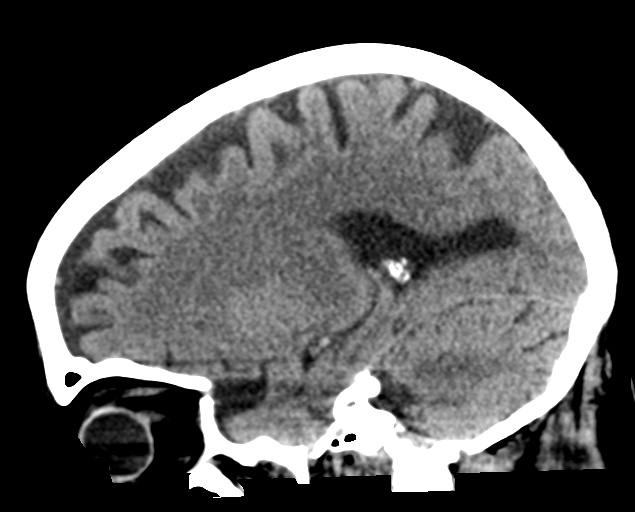

[16 of 45 positions shown; findings below may reference images not displayed]

FINDINGS: Brain: Chronic minimal small vessel ischemic disease. Mild
superficial and central atrophy. No acute intracranial hemorrhage,
midline shift or edema. No large vascular territory infarction.
Midline fourth ventricle and basal cisterns without effacement.

Vascular: No hyperdense vessel or unexpected calcification.

Skull: Normal. Negative for fracture or focal lesion.

Sinuses/Orbits: No acute finding.

Other: None
IMPRESSION: Atrophy with chronic small vessel ischemia. No acute intracranial
abnormality.

## 2019-01-17 ENCOUNTER — Other Ambulatory Visit: Payer: Self-pay | Admitting: Internal Medicine

## 2019-01-18 ENCOUNTER — Other Ambulatory Visit: Payer: Self-pay

## 2019-01-18 DIAGNOSIS — G301 Alzheimer's disease with late onset: Principal | ICD-10-CM

## 2019-01-18 DIAGNOSIS — F0280 Dementia in other diseases classified elsewhere without behavioral disturbance: Secondary | ICD-10-CM

## 2019-01-18 MED ORDER — MEMANTINE HCL 5 MG PO TABS: 10.0000 mg | ORAL_TABLET | Freq: Two times a day (BID) | ORAL | 5 refills | Status: DC

## 2019-01-18 MED ORDER — ALLOPURINOL 100 MG PO TABS: ORAL_TABLET | ORAL | 3 refills | Status: DC

## 2019-02-02 ENCOUNTER — Ambulatory Visit (INDEPENDENT_AMBULATORY_CARE_PROVIDER_SITE_OTHER): Payer: Medicare Other | Admitting: Adult Health

## 2019-02-02 ENCOUNTER — Other Ambulatory Visit: Payer: Self-pay

## 2019-02-02 ENCOUNTER — Encounter: Payer: Self-pay | Admitting: Adult Health

## 2019-02-02 VITALS — BP 108/60 | HR 68 | Resp 16 | Ht 62.0 in | Wt 140.0 lb

## 2019-02-02 DIAGNOSIS — E1165 Type 2 diabetes mellitus with hyperglycemia: Secondary | ICD-10-CM | POA: Diagnosis not present

## 2019-02-02 DIAGNOSIS — F0280 Dementia in other diseases classified elsewhere without behavioral disturbance: Secondary | ICD-10-CM | POA: Diagnosis not present

## 2019-02-02 DIAGNOSIS — K219 Gastro-esophageal reflux disease without esophagitis: Secondary | ICD-10-CM | POA: Diagnosis not present

## 2019-02-02 DIAGNOSIS — I10 Essential (primary) hypertension: Secondary | ICD-10-CM | POA: Diagnosis not present

## 2019-02-02 DIAGNOSIS — G301 Alzheimer's disease with late onset: Secondary | ICD-10-CM

## 2019-02-02 DIAGNOSIS — E785 Hyperlipidemia, unspecified: Secondary | ICD-10-CM | POA: Diagnosis not present

## 2019-02-02 LAB — POCT GLYCOSYLATED HEMOGLOBIN (HGB A1C): Hemoglobin A1C: 6.8 % — AB (ref 4.0–5.6)

## 2019-02-02 MED ORDER — QUETIAPINE FUMARATE 25 MG PO TABS: 25.0000 mg | ORAL_TABLET | Freq: Every day | ORAL | 2 refills | Status: DC

## 2019-02-02 MED ORDER — ALLOPURINOL 100 MG PO TABS: 100.0000 mg | ORAL_TABLET | Freq: Every day | ORAL | 2 refills | Status: DC

## 2019-02-02 NOTE — Progress Notes (Signed)
[facility] [place], [place] [address]  Internal MEDICINE  Office Visit Note  Patient Name: Samantha Franco  [id number]  [id number]  Date of Service: 02/03/2019  Chief Complaint  Patient presents with  . Diabetes    3 month follow up , need 90 day supply of all medications   . Hyperlipidemia  . Hypertension  . Hypothyroidism    HPI  Pt is here for follow up on DM, HLD, HTN, and hypothyroidism. Overall she has been doing well. She reports her blood sugar has been moderately controlled.  A caregiver is in exam room with her.  The caregiver reports when the patients blood sugar is high, she gives her a little extra dose of Levemir.  We discussed that levemir is a 24 hour insulin, and an extra dose will not help with an immediate blood sugar issue. She verbalized understanding.  Her HLD, and Hypothyroidism have been well controlled. Her HTN is also well controlled.  Her BP is 108/60 currently.  She denies chest pain, Sob, palpitations or any other symptoms.  She is requesting some med refills at this time.     Current Medication: Outpatient Encounter Medications as of 02/02/2019  Medication Sig  . acetaminophen (TYLENOL) 325 MG tablet Take by mouth.  Marland Kitchen allopurinol (ZYLOPRIM) 100 MG tablet Take 1 tablet (100 mg total) by mouth daily. Take one  For gout  . ALPRAZolam (XANAX) 0.25 MG tablet Take 1 tablet (0.25 mg total) by mouth at bedtime as needed for anxiety.  Marland Kitchen amLODipine (NORVASC) 5 MG tablet TAKE 1 TABLET BY MOUTH ONCE DAILY  . aspirin 325 MG tablet Take 325 mg by mouth daily.  . bisoprolol-hydrochlorothiazide (ZIAC) 5-6.25 MG tablet TAKE 1 TABLET BY MOUTH ONCE DAILY  . buPROPion (WELLBUTRIN XL) 150 MG 24 hr tablet Take by mouth.  . citalopram (CELEXA) 40 MG tablet Take 40 mg by mouth daily.   . Continuous Blood Gluc Sensor MISC Use as directed every 14 days. May dispense FreeStyle [employer] or similar. diag E11.65  . docusate sodium (COLACE) 100  MG capsule Take 100 mg by mouth daily.  . fluticasone (FLONASE) 50 MCG/ACT nasal spray fluticasone propionate 50 mcg/actuation nasal spray,suspension  . glucose blood (ACCU-CHEK AVIVA PLUS) test strip USE 1 STRIP TO CHECK GLUCOSE TWICE DAILY diag E11.65  . LEVEMIR FLEXTOUCH 100 UNIT/ML Pen INJECT 10 TO 15 UNITS SUBCUTANEOUSLY EACH MORNING AS DIRECTED (Patient taking differently: Inject 10 Units into the skin daily. )  . levothyroxine (SYNTHROID, LEVOTHROID) 75 MCG tablet Take 1 tablet by mouth once daily  . memantine (NAMENDA) 5 MG tablet Take 2 tablets (10 mg total) by mouth 2 (two) times daily.  . QUEtiapine (SEROQUEL) 25 MG tablet Take 1 tablet (25 mg total) by mouth daily.  . traMADol (ULTRAM) 50 MG tablet Take 1 tablet (50 mg total) by mouth every 8 (eight) hours as needed for moderate pain.  . [DISCONTINUED] allopurinol (ZYLOPRIM) 100 MG tablet Take one and one-half tablets at night  For gout (Patient taking differently: Take 100 mg by mouth daily. Take one  For gout)  . [DISCONTINUED] QUEtiapine (SEROQUEL) 25 MG tablet TAKE 1 TABLET BY MOUTH ONCE DAILY AT NIGHT  . famotidine (PEPCID) 40 MG tablet Take 1 tablet (40 mg total) by mouth daily.  . [DISCONTINUED] amoxicillin (AMOXIL) 875 MG tablet amoxicillin 875 mg tablet  . [DISCONTINUED] bisoprolol-hydrochlorothiazide (ZIAC) 5-6.25 MG tablet Take 1 tablet by mouth daily. (Patient not taking: Reported on 02/02/2019)  . [  DISCONTINUED] ciprofloxacin (CIPRO) 500 MG tablet Take one tablet by mouth twice daily for 10 days. (Patient not taking: Reported on 02/02/2019)  . [DISCONTINUED] diclofenac sodium (VOLTAREN) 1 % GEL Voltaren 1 % topical gel  . [DISCONTINUED] diphenhydrAMINE (BENADRYL) 25 mg capsule Take 1 capsule (25 mg total) by mouth every 4 (four) hours as needed.  . [DISCONTINUED] donepezil (ARICEPT) 5 MG tablet Take 1 tablet (5 mg total) by mouth at bedtime. (Patient not taking: Reported on 02/02/2019)  . [DISCONTINUED] glipiZIDE (GLUCOTROL) 10  MG tablet glipizide 10 mg tablet  . [DISCONTINUED] insulin aspart protamine - aspart (NOVOLOG MIX 70/30 FLEXPEN) (70-30) 100 UNIT/ML FlexPen Novolog Mix 70-30 FlexPen U-100 Insulin 100 unit/mL subcutaneous pen  . [DISCONTINUED] levofloxacin (LEVAQUIN) 500 MG tablet levofloxacin 500 mg tablet  . [DISCONTINUED] liraglutide (VICTOZA) 18 MG/3ML SOPN Victoza 2-Pak 0.6 mg/0.1 mL (18 mg/3 mL) subcutaneous pen injector  . [DISCONTINUED] meclizine (ANTIVERT) 25 MG tablet meclizine 25 mg tablet  . [DISCONTINUED] meloxicam (MOBIC) 7.5 MG tablet meloxicam 7.5 mg tablet  . [DISCONTINUED] zolpidem (AMBIEN) 5 MG tablet Take 5 mg by mouth at bedtime.    No facility-administered encounter medications on file as of 02/02/2019.     Surgical History: Past Surgical History:  Procedure Laterality Date  . CARDIAC CATHETERIZATION     [facility]  . CATARACT EXTRACTION    . LUNG LOBECTOMY    . THYROID SURGERY      Medical History: Past Medical History:  Diagnosis Date  . Arthritis   . Chorea   . COPD (chronic obstructive pulmonary disease) ([place])   . Diabetes mellitus without complication ([place])   . Ear infection   . Environmental allergies   . GERD (gastroesophageal reflux disease)   . Hx of rheumatic fever   . Hx: UTI (urinary tract infection)   . Hyperlipidemia   . Hyperlipidemia   . Hypertension   . Hypothyroidism   . Kidney disease   . Lung cancer ([place])   . Mini stroke ([place])   . Stroke ([place])    x's 2  . Vertigo     Family History: Family History  Problem Relation Age of Onset  . Heart attack Father   . Heart disease Father     Social History   Socioeconomic History  . Marital status: Widowed    Spouse name: Not on file  . Number of children: Not on file  . Years of education: Not on file  . Highest education level: Not on file  Occupational History  . Not on file  Social Needs  . Financial resource strain: Not on file  . Food insecurity:    Worry: Not on file    Inability: Not on  file  . Transportation needs:    Medical: Not on file    Non-medical: Not on file  Tobacco Use  . Smoking status: Former Smoker    Types: Cigarettes  . Smokeless tobacco: Never Used  . Tobacco comment: Quit X30 years ago  Substance and Sexual Activity  . Alcohol use: No  . Drug use: No  . Sexual activity: Not on file  Lifestyle  . Physical activity:    Days per week: Not on file    Minutes per session: Not on file  . Stress: Not on file  Relationships  . Social connections:    Talks on phone: Not on file    Gets together: Not on file    Attends religious service: Not on file    Active  member of club or organization: Not on file    Attends meetings of clubs or organizations: Not on file    Relationship status: Not on file  . Intimate partner violence:    Fear of current or ex partner: Not on file    Emotionally abused: Not on file    Physically abused: Not on file    Forced sexual activity: Not on file  Other Topics Concern  . Not on file  Social History Narrative  . Not on file      Review of Systems  Constitutional: Negative for chills, fatigue and unexpected weight change.  HENT: Negative for congestion, rhinorrhea, sneezing and sore throat.   Eyes: Negative for photophobia, pain and redness.  Respiratory: Negative for cough, chest tightness and shortness of breath.   Cardiovascular: Negative for chest pain and palpitations.  Gastrointestinal: Negative for abdominal pain, constipation, diarrhea, nausea and vomiting.  Endocrine: Negative.   Genitourinary: Negative for dysuria and frequency.  Musculoskeletal: Negative for arthralgias, back pain, joint swelling and neck pain.  Skin: Negative for rash.  Allergic/Immunologic: Negative.   Neurological: Negative for tremors and numbness.  Hematological: Negative for adenopathy. Does not bruise/bleed easily.  Psychiatric/Behavioral: Negative for behavioral problems and sleep disturbance. The patient is not  nervous/anxious.     Vital Signs: BP 108/60   Pulse 68   Resp 16   Ht 5\' 2"  (1.575 m)   Wt 140 lb (63.5 kg)   SpO2 98%   BMI 25.61 kg/m    Physical Exam Vitals signs and nursing note reviewed.  Constitutional:      General: She is not in acute distress.    Appearance: She is well-developed. She is not diaphoretic.  HENT:     Head: Normocephalic and atraumatic.     Mouth/Throat:     Pharynx: No oropharyngeal exudate.  Eyes:     Pupils: Pupils are equal, round, and reactive to light.  Neck:     Musculoskeletal: Normal range of motion and neck supple.     Thyroid: No thyromegaly.     Vascular: No JVD.     Trachea: No tracheal deviation.  Cardiovascular:     Rate and Rhythm: Normal rate and regular rhythm.     Heart sounds: Normal heart sounds. No murmur. No friction rub. No gallop.   Pulmonary:     Effort: Pulmonary effort is normal. No respiratory distress.     Breath sounds: Normal breath sounds. No wheezing or rales.  Chest:     Chest wall: No tenderness.  Abdominal:     Palpations: Abdomen is soft.     Tenderness: There is no abdominal tenderness. There is no guarding.  Musculoskeletal: Normal range of motion.  Lymphadenopathy:     Cervical: No cervical adenopathy.  Skin:    General: Skin is warm and dry.  Neurological:     Mental Status: She is alert and oriented to person, place, and time.     Cranial Nerves: No cranial nerve deficit.  Psychiatric:        Behavior: Behavior normal.        Thought Content: Thought content normal.        Judgment: Judgment normal.    Assessment/Plan: 1. Uncontrolled type 2 diabetes mellitus with hyperglycemia (HCC) A1C 6.8 today.  Pt appears to be maintaining over the last year.  We discussed diet modifications. Overall she is doing very well.  - POCT HgB A1C  2. Essential hypertension, benign Stable, continue to use  Ziac and Norvasc as prescribed.   3. Gastroesophageal reflux disease without esophagitis Stable,  continue to use Pepcid as prescribed.   4. Hyperlipidemia, unspecified hyperlipidemia type Stable, continue present management.   5. Late onset Alzheimer's disease without behavioral disturbance ([place]) Stable, continue to follow up as scheduled.   General Counseling: elen acero understanding of the findings of todays visit and agrees with plan of treatment. I have discussed any further diagnostic evaluation that may be needed or ordered today. We also reviewed her medications today. she has been encouraged to call the office with any questions or concerns that should arise related to todays visit.    Orders Placed This Encounter  Procedures  . POCT HgB A1C    Meds ordered this encounter  Medications  . allopurinol (ZYLOPRIM) 100 MG tablet    Sig: Take 1 tablet (100 mg total) by mouth daily. Take one  For gout    Dispense:  90 tablet    Refill:  2  . QUEtiapine (SEROQUEL) 25 MG tablet    Sig: Take 1 tablet (25 mg total) by mouth daily.    Dispense:  90 tablet    Refill:  2    Time spent: 25 Minutes   This patient was seen by Orson Gear AGNP-C in Collaboration with Dr Lavera Guise as a part of collaborative care agreement     Kendell Bane AGNP-C Internal medicine

## 2019-02-02 NOTE — Patient Instructions (Signed)
Diabetes Mellitus and Nutrition, Adult  When you have diabetes (diabetes mellitus), it is very important to have healthy eating habits because your blood sugar (glucose) levels are greatly affected by what you eat and drink. Eating healthy foods in the appropriate amounts, at about the same times every day, can help you:  · Control your blood glucose.  · Lower your risk of heart disease.  · Improve your blood pressure.  · Reach or maintain a healthy weight.  Every person with diabetes is different, and each person has different needs for a meal plan. Your health care provider may recommend that you work with a diet and nutrition specialist (dietitian) to make a meal plan that is best for you. Your meal plan may vary depending on factors such as:  · The calories you need.  · The medicines you take.  · Your weight.  · Your blood glucose, blood pressure, and cholesterol levels.  · Your activity level.  · Other health conditions you have, such as heart or kidney disease.  How do carbohydrates affect me?  Carbohydrates, also called carbs, affect your blood glucose level more than any other type of food. Eating carbs naturally raises the amount of glucose in your blood. Carb counting is a method for keeping track of how many carbs you eat. Counting carbs is important to keep your blood glucose at a healthy level, especially if you use insulin or take certain oral diabetes medicines.  It is important to know how many carbs you can safely have in each meal. This is different for every person. Your dietitian can help you calculate how many carbs you should have at each meal and for each snack.  Foods that contain carbs include:  · Bread, cereal, rice, pasta, and crackers.  · Potatoes and corn.  · Peas, beans, and lentils.  · Milk and yogurt.  · Fruit and juice.  · Desserts, such as cakes, cookies, ice cream, and candy.  How does alcohol affect me?  Alcohol can cause a sudden decrease in blood glucose (hypoglycemia),  especially if you use insulin or take certain oral diabetes medicines. Hypoglycemia can be a life-threatening condition. Symptoms of hypoglycemia (sleepiness, dizziness, and confusion) are similar to symptoms of having too much alcohol.  If your health care provider says that alcohol is safe for you, follow these guidelines:  · Limit alcohol intake to no more than 1 drink per day for nonpregnant women and 2 drinks per day for men. One drink equals 12 oz of beer, 5 oz of wine, or 1½ oz of hard liquor.  · Do not drink on an empty stomach.  · Keep yourself hydrated with water, diet soda, or unsweetened iced tea.  · Keep in mind that regular soda, juice, and other mixers may contain a lot of sugar and must be counted as carbs.  What are tips for following this plan?    Reading food labels  · Start by checking the serving size on the "Nutrition Facts" label of packaged foods and drinks. The amount of calories, carbs, fats, and other nutrients listed on the label is based on one serving of the item. Many items contain more than one serving per package.  · Check the total grams (g) of carbs in one serving. You can calculate the number of servings of carbs in one serving by dividing the total carbs by 15. For example, if a food has 30 g of total carbs, it would be equal to 2   servings of carbs.  · Check the number of grams (g) of saturated and trans fats in one serving. Choose foods that have low or no amount of these fats.  · Check the number of milligrams (mg) of salt (sodium) in one serving. Most people should limit total sodium intake to less than 2,300 mg per day.  · Always check the nutrition information of foods labeled as "low-fat" or "nonfat". These foods may be higher in added sugar or refined carbs and should be avoided.  · Talk to your dietitian to identify your daily goals for nutrients listed on the label.  Shopping  · Avoid buying canned, premade, or processed foods. These foods tend to be high in fat, sodium,  and added sugar.  · Shop around the outside edge of the grocery store. This includes fresh fruits and vegetables, bulk grains, fresh meats, and fresh dairy.  Cooking  · Use low-heat cooking methods, such as baking, instead of high-heat cooking methods like deep frying.  · Cook using healthy oils, such as olive, canola, or sunflower oil.  · Avoid cooking with butter, cream, or high-fat meats.  Meal planning  · Eat meals and snacks regularly, preferably at the same times every day. Avoid going long periods of time without eating.  · Eat foods high in fiber, such as fresh fruits, vegetables, beans, and whole grains. Talk to your dietitian about how many servings of carbs you can eat at each meal.  · Eat 4-6 ounces (oz) of lean protein each day, such as lean meat, chicken, fish, eggs, or tofu. One oz of lean protein is equal to:  ? 1 oz of meat, chicken, or fish.  ? 1 egg.  ? ¼ cup of tofu.  · Eat some foods each day that contain healthy fats, such as avocado, nuts, seeds, and fish.  Lifestyle  · Check your blood glucose regularly.  · Exercise regularly as told by your health care provider. This may include:  ? 150 minutes of moderate-intensity or vigorous-intensity exercise each week. This could be brisk walking, biking, or water aerobics.  ? Stretching and doing strength exercises, such as yoga or weightlifting, at least 2 times a week.  · Take medicines as told by your health care provider.  · Do not use any products that contain nicotine or tobacco, such as cigarettes and e-cigarettes. If you need help quitting, ask your health care provider.  · Work with a counselor or diabetes educator to identify strategies to manage stress and any emotional and social challenges.  Questions to ask a health care provider  · Do I need to meet with a diabetes educator?  · Do I need to meet with a dietitian?  · What number can I call if I have questions?  · When are the best times to check my blood glucose?  Where to find more  information:  · American Diabetes Association: diabetes.org  · Academy of Nutrition and Dietetics: www.eatright.org  · National Institute of Diabetes and Digestive and Kidney Diseases (NIH): www.niddk.nih.gov  Summary  · A healthy meal plan will help you control your blood glucose and maintain a healthy lifestyle.  · Working with a diet and nutrition specialist (dietitian) can help you make a meal plan that is best for you.  · Keep in mind that carbohydrates (carbs) and alcohol have immediate effects on your blood glucose levels. It is important to count carbs and to use alcohol carefully.  This information is not intended to   replace advice given to you by your health care provider. Make sure you discuss any questions you have with your health care provider.  Document Released: 07/25/2005 Document Revised: 05/28/2017 Document Reviewed: 12/02/2016  [employer]vier Interactive Patient Education © 2019 [employer]

## 2019-04-26 ENCOUNTER — Other Ambulatory Visit: Payer: Self-pay

## 2019-04-26 ENCOUNTER — Other Ambulatory Visit: Payer: Self-pay | Admitting: Adult Health

## 2019-04-26 MED ORDER — LEVOTHYROXINE SODIUM 75 MCG PO TABS: 75.0000 ug | ORAL_TABLET | Freq: Every day | ORAL | 0 refills | Status: DC

## 2019-05-06 ENCOUNTER — Other Ambulatory Visit: Payer: Self-pay

## 2019-05-06 ENCOUNTER — Encounter: Payer: Self-pay | Admitting: Adult Health

## 2019-05-06 ENCOUNTER — Ambulatory Visit (INDEPENDENT_AMBULATORY_CARE_PROVIDER_SITE_OTHER): Payer: Medicare Other | Admitting: Adult Health

## 2019-05-06 VITALS — BP 138/62 | HR 72 | Resp 16 | Ht 62.0 in | Wt 139.0 lb

## 2019-05-06 DIAGNOSIS — I10 Essential (primary) hypertension: Secondary | ICD-10-CM | POA: Diagnosis not present

## 2019-05-06 DIAGNOSIS — E785 Hyperlipidemia, unspecified: Secondary | ICD-10-CM

## 2019-05-06 DIAGNOSIS — E1165 Type 2 diabetes mellitus with hyperglycemia: Secondary | ICD-10-CM | POA: Diagnosis not present

## 2019-05-06 DIAGNOSIS — B356 Tinea cruris: Secondary | ICD-10-CM

## 2019-05-06 DIAGNOSIS — K219 Gastro-esophageal reflux disease without esophagitis: Secondary | ICD-10-CM

## 2019-05-06 LAB — POCT GLYCOSYLATED HEMOGLOBIN (HGB A1C): Hemoglobin A1C: 7.2 % — AB (ref 4.0–5.6)

## 2019-05-06 MED ORDER — LEVEMIR FLEXTOUCH 100 UNIT/ML ~~LOC~~ SOPN: 12.0000 [IU] | PEN_INJECTOR | Freq: Every day | SUBCUTANEOUS | 6 refills | Status: DC

## 2019-05-06 MED ORDER — FLUCONAZOLE 150 MG PO TABS: 150.0000 mg | ORAL_TABLET | ORAL | 0 refills | Status: DC

## 2019-05-06 MED ORDER — TERBINAFINE HCL 1 % EX CREA: 1.0000 "application " | TOPICAL_CREAM | Freq: Two times a day (BID) | CUTANEOUS | 0 refills | Status: AC

## 2019-05-06 NOTE — Progress Notes (Signed)
[facility] [place], [place] [address]  Internal MEDICINE  Office Visit Note  Patient Name: Samantha Franco  [id number]  [id number]  Date of Service: 05/06/2019  Chief Complaint  Patient presents with  . Medical Management of Chronic Issues  . Diabetes  . Hypothyroidism  . Rash    left foot     HPI Pt is here for follow up on DM, Hypothyroidism and rash.  She reports she has been taking her insulin daily (10 units).  Her A1C has increased to 7.2.  She denies any issues with her thyroid.  She also reports 2 months of left foot rash.  Her caregiven reports it began like a ringworm with circular itching with redness and central clearing.  It has gotten bigger and the patient complains of itching at the site.  They have tried multiple otc medications, with no relief.     Current Medication: Outpatient Encounter Medications as of 05/06/2019  Medication Sig  . acetaminophen (TYLENOL) 325 MG tablet Take by mouth.  Marland Kitchen allopurinol (ZYLOPRIM) 100 MG tablet Take 1 tablet (100 mg total) by mouth daily. Take one  For gout  . ALPRAZolam (XANAX) 0.25 MG tablet Take 1 tablet (0.25 mg total) by mouth at bedtime as needed for anxiety.  Marland Kitchen amLODipine (NORVASC) 5 MG tablet TAKE 1 TABLET BY MOUTH ONCE DAILY  . aspirin 325 MG tablet Take 325 mg by mouth daily.  . bisoprolol-hydrochlorothiazide (ZIAC) 5-6.25 MG tablet TAKE 1 TABLET BY MOUTH ONCE DAILY  . buPROPion (WELLBUTRIN XL) 150 MG 24 hr tablet Take by mouth.  . Continuous Blood Gluc Sensor MISC Use as directed every 14 days. May dispense FreeStyle [employer] or similar. diag E11.65  . docusate sodium (COLACE) 100 MG capsule Take 100 mg by mouth daily.  Marland Kitchen glucose blood (ACCU-CHEK AVIVA PLUS) test strip USE 1 STRIP TO CHECK GLUCOSE TWICE DAILY diag E11.65  . Insulin Detemir (LEVEMIR FLEXTOUCH) 100 UNIT/ML Pen Inject 12 Units into the skin daily.  Marland Kitchen levothyroxine (SYNTHROID) 75 MCG tablet Take 1 tablet (75 mcg total)  by mouth daily.  . memantine (NAMENDA) 5 MG tablet Take 2 tablets (10 mg total) by mouth 2 (two) times daily.  . QUEtiapine (SEROQUEL) 50 MG tablet Take 50 mg by mouth at bedtime.  . traMADol (ULTRAM) 50 MG tablet Take 1 tablet (50 mg total) by mouth every 8 (eight) hours as needed for moderate pain.  . [DISCONTINUED] LEVEMIR FLEXTOUCH 100 UNIT/ML Pen INJECT 10 TO 15 UNITS SUBCUTANEOUSLY EACH MORNING AS DIRECTED (Patient taking differently: Inject 10 Units into the skin daily. )  . famotidine (PEPCID) 40 MG tablet Take 1 tablet (40 mg total) by mouth daily.  . fluconazole (DIFLUCAN) 150 MG tablet Take 1 tablet (150 mg total) by mouth once a week.  . terbinafine (LAMISIL) 1 % cream Apply 1 application topically 2 (two) times daily.  . [DISCONTINUED] citalopram (CELEXA) 40 MG tablet Take 40 mg by mouth daily.   . [DISCONTINUED] fluticasone (FLONASE) 50 MCG/ACT nasal spray fluticasone propionate 50 mcg/actuation nasal spray,suspension  . [DISCONTINUED] QUEtiapine (SEROQUEL) 25 MG tablet Take 1 tablet (25 mg total) by mouth daily.   No facility-administered encounter medications on file as of 05/06/2019.     Surgical History: Past Surgical History:  Procedure Laterality Date  . CARDIAC CATHETERIZATION     [facility]  . CATARACT EXTRACTION    . LUNG LOBECTOMY    . THYROID SURGERY      Medical  History: Past Medical History:  Diagnosis Date  . Arthritis   . Chorea   . COPD (chronic obstructive pulmonary disease) ([place])   . Diabetes mellitus without complication ([place])   . Ear infection   . Environmental allergies   . GERD (gastroesophageal reflux disease)   . Hx of rheumatic fever   . Hx: UTI (urinary tract infection)   . Hyperlipidemia   . Hyperlipidemia   . Hypertension   . Hypothyroidism   . Kidney disease   . Lung cancer ([place])   . Mini stroke ([place])   . Stroke ([place])    x's 2  . Vertigo     Family History: Family History  Problem Relation Age of Onset  . Heart attack Father   .  Heart disease Father     Social History   Socioeconomic History  . Marital status: Widowed    Spouse name: Not on file  . Number of children: Not on file  . Years of education: Not on file  . Highest education level: Not on file  Occupational History  . Not on file  Social Needs  . Financial resource strain: Not on file  . Food insecurity    Worry: Not on file    Inability: Not on file  . Transportation needs    Medical: Not on file    Non-medical: Not on file  Tobacco Use  . Smoking status: Former Smoker    Types: Cigarettes  . Smokeless tobacco: Never Used  . Tobacco comment: Quit X30 years ago  Substance and Sexual Activity  . Alcohol use: No  . Drug use: No  . Sexual activity: Not on file  Lifestyle  . Physical activity    Days per week: Not on file    Minutes per session: Not on file  . Stress: Not on file  Relationships  . Social Herbalist on phone: Not on file    Gets together: Not on file    Attends religious service: Not on file    Active member of club or organization: Not on file    Attends meetings of clubs or organizations: Not on file    Relationship status: Not on file  . Intimate partner violence    Fear of current or ex partner: Not on file    Emotionally abused: Not on file    Physically abused: Not on file    Forced sexual activity: Not on file  Other Topics Concern  . Not on file  Social History Narrative  . Not on file      Review of Systems  Constitutional: Negative for chills, fatigue and unexpected weight change.  HENT: Negative for congestion, rhinorrhea, sneezing and sore throat.   Eyes: Negative for photophobia, pain and redness.  Respiratory: Negative for cough, chest tightness and shortness of breath.   Cardiovascular: Negative for chest pain and palpitations.  Gastrointestinal: Negative for abdominal pain, constipation, diarrhea, nausea and vomiting.  Endocrine: Negative.   Genitourinary: Negative for dysuria and  frequency.  Musculoskeletal: Negative for arthralgias, back pain, joint swelling and neck pain.  Skin: Positive for rash.  Allergic/Immunologic: Negative.   Neurological: Negative for tremors and numbness.  Hematological: Negative for adenopathy. Does not bruise/bleed easily.  Psychiatric/Behavioral: Negative for behavioral problems and sleep disturbance. The patient is not nervous/anxious.     Vital Signs: BP 138/62   Pulse 72   Resp 16   Ht 5\' 2"  (1.575 m)   Wt 139 lb (  63 kg)   SpO2 96%   BMI 25.42 kg/m    Physical Exam Vitals signs and nursing note reviewed.  Constitutional:      General: She is not in acute distress.    Appearance: She is well-developed. She is not diaphoretic.  HENT:     Head: Normocephalic and atraumatic.     Mouth/Throat:     Pharynx: No oropharyngeal exudate.  Eyes:     Pupils: Pupils are equal, round, and reactive to light.  Neck:     Musculoskeletal: Normal range of motion and neck supple.     Thyroid: No thyromegaly.     Vascular: No JVD.     Trachea: No tracheal deviation.  Cardiovascular:     Rate and Rhythm: Normal rate and regular rhythm.     Heart sounds: Normal heart sounds. No murmur. No friction rub. No gallop.   Pulmonary:     Effort: Pulmonary effort is normal. No respiratory distress.     Breath sounds: Normal breath sounds. No wheezing or rales.  Chest:     Chest wall: No tenderness.  Abdominal:     Palpations: Abdomen is soft.     Tenderness: There is no abdominal tenderness. There is no guarding.  Musculoskeletal: Normal range of motion.  Lymphadenopathy:     Cervical: No cervical adenopathy.  Skin:    General: Skin is warm and dry.     Comments: Rash to dorsum of left foot.  Neurological:     Mental Status: She is alert and oriented to person, place, and time.     Cranial Nerves: No cranial nerve deficit.  Psychiatric:        Behavior: Behavior normal.        Thought Content: Thought content normal.        Judgment:  Judgment normal.    Assessment/Plan: 1. Uncontrolled type 2 diabetes mellitus with hyperglycemia (HCC) A1C 7.2 today. Increased patients levemir to 12 units per day.  Will follow up in 3 months.  - POCT HgB A1C - Insulin Detemir (LEVEMIR FLEXTOUCH) 100 UNIT/ML Pen; Inject 12 Units into the skin daily.  Dispense: 3 pen; Refill: 6  2. Tinea cruris Use diflucan and Lamisil as directed. Will refer to dermatology if no change in 6 weeks.  - terbinafine (LAMISIL) 1 % cream; Apply 1 application topically 2 (two) times daily.  Dispense: 30 g; Refill: 0 - fluconazole (DIFLUCAN) 150 MG tablet; Take 1 tablet (150 mg total) by mouth once a week.  Dispense: 6 tablet; Refill: 0  3. Essential hypertension, benign Stable, continue present management.  4. Gastroesophageal reflux disease without esophagitis Stable, continue present management.   5. Hyperlipidemia, unspecified hyperlipidemia type Continue to take medication as directed.   General Counseling: shandee jergens understanding of the findings of todays visit and agrees with plan of treatment. I have discussed any further diagnostic evaluation that may be needed or ordered today. We also reviewed her medications today. she has been encouraged to call the office with any questions or concerns that should arise related to todays visit.    Orders Placed This Encounter  Procedures  . POCT HgB A1C    Meds ordered this encounter  Medications  . terbinafine (LAMISIL) 1 % cream    Sig: Apply 1 application topically 2 (two) times daily.    Dispense:  30 g    Refill:  0  . fluconazole (DIFLUCAN) 150 MG tablet    Sig: Take 1 tablet (150 mg total) by  mouth once a week.    Dispense:  6 tablet    Refill:  0  . Insulin Detemir (LEVEMIR FLEXTOUCH) 100 UNIT/ML Pen    Sig: Inject 12 Units into the skin daily.    Dispense:  3 pen    Refill:  6    Please consider 90 day supplies to promote better adherence    Time spent: 20 Minutes   This  patient was seen by Orson Gear AGNP-C in Collaboration with Dr Lavera Guise as a part of collaborative care agreement     Kendell Bane AGNP-C Internal medicine

## 2019-05-31 ENCOUNTER — Other Ambulatory Visit: Payer: Self-pay | Admitting: Adult Health

## 2019-07-08 ENCOUNTER — Ambulatory Visit: Payer: Self-pay | Admitting: Nurse Practitioner

## 2019-07-15 ENCOUNTER — Other Ambulatory Visit: Payer: Self-pay

## 2019-07-15 MED ORDER — ACCU-CHEK AVIVA PLUS VI STRP: ORAL_STRIP | 5 refills | Status: DC

## 2019-07-27 ENCOUNTER — Other Ambulatory Visit: Payer: Self-pay | Admitting: Adult Health

## 2019-07-27 DIAGNOSIS — F0280 Dementia in other diseases classified elsewhere without behavioral disturbance: Secondary | ICD-10-CM

## 2019-07-27 DIAGNOSIS — G301 Alzheimer's disease with late onset: Secondary | ICD-10-CM

## 2019-07-28 ENCOUNTER — Other Ambulatory Visit: Payer: Self-pay

## 2019-07-28 DIAGNOSIS — F0280 Dementia in other diseases classified elsewhere without behavioral disturbance: Secondary | ICD-10-CM

## 2019-07-28 MED ORDER — MEMANTINE HCL 5 MG PO TABS: 10.0000 mg | ORAL_TABLET | Freq: Two times a day (BID) | ORAL | 0 refills | Status: DC

## 2019-07-29 DIAGNOSIS — Z23 Encounter for immunization: Secondary | ICD-10-CM | POA: Diagnosis not present

## 2019-08-09 ENCOUNTER — Encounter: Payer: Self-pay | Admitting: Nurse Practitioner

## 2019-08-09 ENCOUNTER — Other Ambulatory Visit: Payer: Self-pay

## 2019-08-09 ENCOUNTER — Ambulatory Visit (INDEPENDENT_AMBULATORY_CARE_PROVIDER_SITE_OTHER): Payer: Medicare Other | Admitting: Nurse Practitioner

## 2019-08-09 VITALS — BP 125/48 | HR 67 | Temp 97.6°F | Resp 16 | Ht 62.0 in | Wt 140.0 lb

## 2019-08-09 DIAGNOSIS — E1165 Type 2 diabetes mellitus with hyperglycemia: Secondary | ICD-10-CM

## 2019-08-09 DIAGNOSIS — Z0001 Encounter for general adult medical examination with abnormal findings: Secondary | ICD-10-CM

## 2019-08-09 DIAGNOSIS — F0280 Dementia in other diseases classified elsewhere without behavioral disturbance: Secondary | ICD-10-CM | POA: Diagnosis not present

## 2019-08-09 DIAGNOSIS — R300 Dysuria: Secondary | ICD-10-CM

## 2019-08-09 DIAGNOSIS — L209 Atopic dermatitis, unspecified: Secondary | ICD-10-CM | POA: Diagnosis not present

## 2019-08-09 DIAGNOSIS — G301 Alzheimer's disease with late onset: Secondary | ICD-10-CM

## 2019-08-09 DIAGNOSIS — E039 Hypothyroidism, unspecified: Secondary | ICD-10-CM | POA: Diagnosis not present

## 2019-08-09 DIAGNOSIS — I10 Essential (primary) hypertension: Secondary | ICD-10-CM | POA: Diagnosis not present

## 2019-08-09 LAB — POCT GLYCOSYLATED HEMOGLOBIN (HGB A1C): Hemoglobin A1C: 7.1 % — AB (ref 4.0–5.6)

## 2019-08-09 MED ORDER — MEMANTINE HCL 5 MG PO TABS: 10.0000 mg | ORAL_TABLET | Freq: Two times a day (BID) | ORAL | 1 refills | Status: DC

## 2019-08-09 MED ORDER — TRIAMCINOLONE ACETONIDE 0.025 % EX CREA: 1.0000 "application " | TOPICAL_CREAM | Freq: Two times a day (BID) | CUTANEOUS | 2 refills | Status: DC

## 2019-08-09 MED ORDER — LEVOTHYROXINE SODIUM 75 MCG PO TABS: 75.0000 ug | ORAL_TABLET | Freq: Every day | ORAL | 0 refills | Status: DC

## 2019-08-09 MED ORDER — AMLODIPINE BESYLATE 5 MG PO TABS: 5.0000 mg | ORAL_TABLET | Freq: Every day | ORAL | 1 refills | Status: DC

## 2019-08-09 NOTE — Progress Notes (Signed)
[facility] [place], [place] [address]  Internal MEDICINE  Office Visit Note  Patient Name: Samantha Franco  [id number]  [id number]  Date of Service: 08/21/2019   Pt is here for routine health maintenance examination  Chief Complaint  Patient presents with  . Annual Exam  . Hypertension  . Diabetes  . Hyperlipidemia  . Quality Metric Gaps    diabetic eye and foot exam     The patient is here for health maintenance exam. She has spot on the top of left foot which is red and itchy. Appeared a few days ago. States that it makes her whole foot itch. She states that she is doing well. Blood sugars and blood pressures are both doing well. She does not wish to get her eyes examined. Her HgbA1c is 7.1 today, down from 7.2 at her last visit. She has no other concerns or complaints today.    Current Medication: Outpatient Encounter Medications as of 08/09/2019  Medication Sig  . acetaminophen (TYLENOL) 325 MG tablet Take by mouth.  Marland Kitchen allopurinol (ZYLOPRIM) 100 MG tablet Take 1 tablet (100 mg total) by mouth daily. Take one  For gout  . ALPRAZolam (XANAX) 0.25 MG tablet Take 1 tablet (0.25 mg total) by mouth at bedtime as needed for anxiety.  Marland Kitchen amLODipine (NORVASC) 5 MG tablet Take 1 tablet (5 mg total) by mouth daily.  Marland Kitchen aspirin 325 MG tablet Take 325 mg by mouth daily.  . bisoprolol-hydrochlorothiazide (ZIAC) 5-6.25 MG tablet TAKE 1 TABLET BY MOUTH ONCE DAILY  . buPROPion (WELLBUTRIN XL) 150 MG 24 hr tablet Take by mouth.  . Continuous Blood Gluc Sensor MISC Use as directed every 14 days. May dispense FreeStyle [employer] or similar. diag E11.65  . docusate sodium (COLACE) 100 MG capsule Take 100 mg by mouth daily.  . fluconazole (DIFLUCAN) 150 MG tablet Take 1 tablet (150 mg total) by mouth once a week.  Marland Kitchen glucose blood (ACCU-CHEK AVIVA PLUS) test strip USE 1 STRIP TO CHECK GLUCOSE TWICE DAILY diag E11.65  . Insulin Detemir (LEVEMIR FLEXTOUCH) 100  UNIT/ML Pen Inject 12 Units into the skin daily.  Marland Kitchen levothyroxine (SYNTHROID) 75 MCG tablet Take 1 tablet (75 mcg total) by mouth daily.  . memantine (NAMENDA) 5 MG tablet Take 2 tablets (10 mg total) by mouth 2 (two) times daily.  . QUEtiapine (SEROQUEL) 50 MG tablet Take 50 mg by mouth at bedtime.  . terbinafine (LAMISIL) 1 % cream Apply 1 application topically 2 (two) times daily.  . traMADol (ULTRAM) 50 MG tablet Take 1 tablet (50 mg total) by mouth every 8 (eight) hours as needed for moderate pain.  . [DISCONTINUED] amLODipine (NORVASC) 5 MG tablet Take 1 tablet by mouth once daily  . [DISCONTINUED] levothyroxine (SYNTHROID) 75 MCG tablet Take 1 tablet (75 mcg total) by mouth daily.  . [DISCONTINUED] memantine (NAMENDA) 5 MG tablet Take 2 tablets (10 mg total) by mouth 2 (two) times daily.  . famotidine (PEPCID) 40 MG tablet Take 1 tablet (40 mg total) by mouth daily.  Marland Kitchen triamcinolone (KENALOG) 0.025 % cream Apply 1 application topically 2 (two) times daily.   No facility-administered encounter medications on file as of 08/09/2019.     Surgical History: Past Surgical History:  Procedure Laterality Date  . CARDIAC CATHETERIZATION     [facility]  . CATARACT EXTRACTION    . LUNG LOBECTOMY    . THYROID SURGERY      Medical History: Past Medical  History:  Diagnosis Date  . Arthritis   . Chorea   . COPD (chronic obstructive pulmonary disease) ([place])   . Diabetes mellitus without complication ([place])   . Ear infection   . Environmental allergies   . GERD (gastroesophageal reflux disease)   . Hx of rheumatic fever   . Hx: UTI (urinary tract infection)   . Hyperlipidemia   . Hyperlipidemia   . Hypertension   . Hypothyroidism   . Kidney disease   . Lung cancer ([place])   . Mini stroke ([place])   . Stroke ([place])    x's 2  . Vertigo     Family History: Family History  Problem Relation Age of Onset  . Heart attack Father   . Heart disease Father       Review of Systems   Constitutional: Negative for chills, fatigue and unexpected weight change.  HENT: Negative for congestion, rhinorrhea, sneezing and sore throat.   Respiratory: Negative for cough, chest tightness, shortness of breath and wheezing.   Cardiovascular: Negative for chest pain and palpitations.  Gastrointestinal: Negative for abdominal pain, constipation, diarrhea, nausea and vomiting.  Endocrine: Negative for cold intolerance, heat intolerance, polydipsia and polyuria.       Blood sugars doing well   Genitourinary: Negative for dysuria and frequency.  Musculoskeletal: Negative for arthralgias, back pain, joint swelling and neck pain.  Skin: Positive for rash.       Top of left foot.   Allergic/Immunologic: Negative.  Negative for environmental allergies.  Neurological: Negative for dizziness, tremors, numbness and headaches.  Hematological: Negative for adenopathy. Does not bruise/bleed easily.  Psychiatric/Behavioral: Negative for behavioral problems and sleep disturbance. The patient is not nervous/anxious.      Today's Vitals   08/09/19 1407  BP: (!) 125/48  Pulse: 67  Resp: 16  Temp: 97.6 F (36.4 C)  SpO2: 96%  Weight: 140 lb (63.5 kg)  Height: 5\' 2"  (1.575 m)   Body mass index is 25.61 kg/m.  Physical Exam Vitals signs and nursing note reviewed.  Constitutional:      General: She is not in acute distress.    Appearance: Normal appearance. She is well-developed. She is not diaphoretic.  HENT:     Head: Normocephalic and atraumatic.     Nose: Nose normal.     Mouth/Throat:     Pharynx: No oropharyngeal exudate.  Eyes:     Pupils: Pupils are equal, round, and reactive to light.  Neck:     Musculoskeletal: Normal range of motion and neck supple.     Thyroid: No thyromegaly.     Vascular: No JVD.     Trachea: No tracheal deviation.  Cardiovascular:     Rate and Rhythm: Normal rate and regular rhythm.     Pulses: Normal pulses.          Dorsalis pedis pulses are 2+  on the right side and 2+ on the left side.       Posterior tibial pulses are 2+ on the right side and 2+ on the left side.     Heart sounds: Normal heart sounds. No murmur. No friction rub. No gallop.   Pulmonary:     Effort: Pulmonary effort is normal. No respiratory distress.     Breath sounds: Normal breath sounds. No wheezing or rales.  Chest:     Chest wall: No tenderness.  Abdominal:     General: Bowel sounds are normal.     Palpations: Abdomen is soft.  Tenderness: There is no abdominal tenderness. There is no guarding.  Musculoskeletal: Normal range of motion.     Right foot: Normal range of motion. No deformity.     Left foot: Normal range of motion. No deformity.  Feet:     Right foot:     Protective Sensation: 10 sites tested. 10 sites sensed.     Skin integrity: Skin integrity normal.     Toenail Condition: Right toenails are normal.     Left foot:     Protective Sensation: 10 sites tested. 10 sites sensed.     Skin integrity: Skin integrity normal.     Toenail Condition: Left toenails are normal.  Lymphadenopathy:     Cervical: No cervical adenopathy.  Skin:    General: Skin is warm and dry.     Comments: Rash to dorsum of left foot.  Neurological:     General: No focal deficit present.     Mental Status: She is alert and oriented to person, place, and time.     Cranial Nerves: No cranial nerve deficit.  Psychiatric:        Behavior: Behavior normal.        Thought Content: Thought content normal.        Judgment: Judgment normal.    Depression screen [facility] 2/9 08/09/2019 05/06/2019 05/06/2019 02/02/2019 10/30/2018  Decreased Interest 0 0 0 0 0  Down, Depressed, Hopeless 0 0 0 0 0  PHQ - 2 Score 0 0 0 0 0    Functional Status Survey: Is the patient deaf or have difficulty hearing?: No Does the patient have difficulty seeing, even when wearing glasses/contacts?: No Does the patient have difficulty concentrating, remembering, or making decisions?: No Does the  patient have difficulty walking or climbing stairs?: No Does the patient have difficulty dressing or bathing?: No Does the patient have difficulty doing errands alone such as visiting a doctor's office or shopping?: Yes  MMSE - Mini Mental State Exam 08/09/2019 06/30/2018  Orientation to time 5 5  Orientation to Place 5 5  Registration 3 3  Attention/ Calculation 5 5  Recall 3 3  Language- name 2 objects 2 2  Language- repeat 1 1  Language- follow 3 step command 3 3  Language- read & follow direction 1 1  Write a sentence 1 1  Copy design 1 1  Total score 30 30    Fall Risk  08/09/2019 05/06/2019 02/02/2019 10/30/2018 06/30/2018  Falls in the past year? 0 0 0 1 No  Number falls in past yr: - 0 - 0 -  Injury with Fall? - 0 - 0 -      LABS: Recent Results (from the past 2160 hour(s))  UA/M w/rflx Culture, Routine     Status: None   Collection Time: 08/09/19  2:07 PM   Specimen: Urine   URINE  Result Value Ref Range   Specific Gravity, UA 1.015 1.005 - 1.030   pH, UA 5.0 5.0 - 7.5   Color, UA Yellow Yellow   Appearance Ur Clear Clear   Leukocytes,UA Negative Negative   Protein,UA Negative Negative/Trace   Glucose, UA Negative Negative   Ketones, UA Negative Negative   RBC, UA Negative Negative   Bilirubin, UA Negative Negative   Urobilinogen, Ur 0.2 0.2 - 1.0 mg/dL   Nitrite, UA Negative Negative   Microscopic Examination Comment     Comment: Microscopic follows if indicated.   Microscopic Examination See below:     Comment: Microscopic  was indicated and was performed.   Urinalysis Reflex Comment     Comment: This specimen will not reflex to a Urine Culture.  Microscopic Examination     Status: Abnormal   Collection Time: 08/09/19  2:07 PM   URINE  Result Value Ref Range   WBC, UA 0-5 0 - 5 /hpf   RBC None seen 0 - 2 /hpf   Epithelial Cells (non renal) 0-10 0 - 10 /hpf   Casts Present (A) None seen /lpf   Cast Type Hyaline casts N/A   Mucus, UA Present Not Estab.    Bacteria, UA None seen None seen/Few  POCT HgB A1C     Status: Abnormal   Collection Time: 08/09/19  3:04 PM  Result Value Ref Range   Hemoglobin A1C 7.1 (A) 4.0 - 5.6 %   HbA1c POC (<> result, manual entry)     HbA1c, POC (prediabetic range)     HbA1c, POC (controlled diabetic range)     Assessment/Plan: 1. Encounter for general adult medical examination with abnormal findings Annual health maintenance exam today.   2. Type 2 diabetes mellitus with hyperglycemia, unspecified whether long term insulin use (HCC) - POCT HgB A1C 7.1 today. Continue diabetic medication as prescribed.   3. Atopic dermatitis, unspecified type May use triamcinolone cream twice dailly as needed for itching/rash.  - triamcinolone (KENALOG) 0.025 % cream; Apply 1 application topically 2 (two) times daily.  Dispense: 80 g; Refill: 2  4. Essential hypertension, benign Stable. Continue bp medication as prescribed  - amLODipine (NORVASC) 5 MG tablet; Take 1 tablet (5 mg total) by mouth daily.  Dispense: 90 tablet; Refill: 1  5. Acquired hypothyroidism Continue levothyroxine as prescribed.  - levothyroxine (SYNTHROID) 75 MCG tablet; Take 1 tablet (75 mcg total) by mouth daily.  Dispense: 90 tablet; Refill: 0  6. Late onset Alzheimer's disease without behavioral disturbance (HCC) Continue namenda 10mg  twice daily.  - memantine (NAMENDA) 5 MG tablet; Take 2 tablets (10 mg total) by mouth 2 (two) times daily.  Dispense: 120 tablet; Refill: 1  7. Dysuria - UA/M w/rflx Culture, Routine  General Counseling: Tasheema verbalizes understanding of the findings of todays visit and agrees with plan of treatment. I have discussed any further diagnostic evaluation that may be needed or ordered today. We also reviewed her medications today. she has been encouraged to call the office with any questions or concerns that should arise related to todays visit.    Counseling:  Diabetes Counseling:  1. Addition of ACE inh/  ARB'S for nephroprotection. Microalbumin is updated  2. Diabetic foot care, prevention of complications. Podiatry consult 3. Exercise and lose weight.  4. Diabetic eye examination, Diabetic eye exam is updated  5. Monitor blood sugar closlely. nutrition counseling.  6. Sign and symptoms of hypoglycemia including shaking sweating,confusion and headaches.   This patient was seen by Leretha Pol FNP Collaboration with Dr Lavera Guise as a part of collaborative care agreement  Orders Placed This Encounter  Procedures  . Microscopic Examination  . UA/M w/rflx Culture, Routine  . POCT HgB A1C    Meds ordered this encounter  Medications  . triamcinolone (KENALOG) 0.025 % cream    Sig: Apply 1 application topically 2 (two) times daily.    Dispense:  80 g    Refill:  2    Order Specific Question:   Supervising Provider    Answer:   Lavera Guise [[id number]]  . levothyroxine (SYNTHROID) 75 MCG tablet  Sig: Take 1 tablet (75 mcg total) by mouth daily.    Dispense:  90 tablet    Refill:  0    Order Specific Question:   Supervising Provider    Answer:   Lavera Guise [[id number]]  . memantine (NAMENDA) 5 MG tablet    Sig: Take 2 tablets (10 mg total) by mouth 2 (two) times daily.    Dispense:  120 tablet    Refill:  1    Order Specific Question:   Supervising Provider    Answer:   Lavera Guise [[id number]]  . amLODipine (NORVASC) 5 MG tablet    Sig: Take 1 tablet (5 mg total) by mouth daily.    Dispense:  90 tablet    Refill:  1    Order Specific Question:   Supervising Provider    Answer:   Lavera Guise [[id number]]    Time spent:45 [place], MD  Internal Medicine

## 2019-08-10 LAB — UA/M W/RFLX CULTURE, ROUTINE
Bilirubin, UA: NEGATIVE
Glucose, UA: NEGATIVE
Ketones, UA: NEGATIVE
Leukocytes,UA: NEGATIVE
Nitrite, UA: NEGATIVE
Protein,UA: NEGATIVE
RBC, UA: NEGATIVE
Specific Gravity, UA: 1.015 (ref 1.005–1.030)
Urobilinogen, Ur: 0.2 mg/dL (ref 0.2–1.0)
pH, UA: 5 (ref 5.0–7.5)

## 2019-08-10 LAB — MICROSCOPIC EXAMINATION
Bacteria, UA: NONE SEEN
RBC, Urine: NONE SEEN /hpf (ref 0–2)

## 2019-08-21 DIAGNOSIS — Z0001 Encounter for general adult medical examination with abnormal findings: Secondary | ICD-10-CM | POA: Insufficient documentation

## 2019-08-21 DIAGNOSIS — F0280 Dementia in other diseases classified elsewhere without behavioral disturbance: Secondary | ICD-10-CM | POA: Insufficient documentation

## 2019-08-21 DIAGNOSIS — R300 Dysuria: Secondary | ICD-10-CM | POA: Insufficient documentation

## 2019-08-21 DIAGNOSIS — L209 Atopic dermatitis, unspecified: Secondary | ICD-10-CM | POA: Insufficient documentation

## 2019-08-21 DIAGNOSIS — E1165 Type 2 diabetes mellitus with hyperglycemia: Secondary | ICD-10-CM | POA: Insufficient documentation

## 2019-08-21 DIAGNOSIS — E039 Hypothyroidism, unspecified: Secondary | ICD-10-CM | POA: Insufficient documentation

## 2019-08-23 ENCOUNTER — Other Ambulatory Visit: Payer: Self-pay

## 2019-08-23 MED ORDER — BISOPROLOL-HYDROCHLOROTHIAZIDE 5-6.25 MG PO TABS: 1.0000 | ORAL_TABLET | Freq: Every day | ORAL | 3 refills | Status: DC

## 2019-08-30 DIAGNOSIS — G309 Alzheimer's disease, unspecified: Secondary | ICD-10-CM | POA: Diagnosis not present

## 2019-08-30 DIAGNOSIS — F0151 Vascular dementia with behavioral disturbance: Secondary | ICD-10-CM | POA: Diagnosis not present

## 2019-11-03 ENCOUNTER — Ambulatory Visit: Payer: Medicare Other | Attending: Internal Medicine

## 2019-11-03 DIAGNOSIS — Z20822 Contact with and (suspected) exposure to covid-19: Secondary | ICD-10-CM

## 2019-11-03 DIAGNOSIS — Z20828 Contact with and (suspected) exposure to other viral communicable diseases: Secondary | ICD-10-CM | POA: Diagnosis not present

## 2019-11-04 ENCOUNTER — Telehealth: Payer: Self-pay

## 2019-11-04 LAB — NOVEL CORONAVIRUS, NAA: SARS-CoV-2, NAA: NOT DETECTED

## 2019-11-04 NOTE — Telephone Encounter (Signed)
CONFIRMED AND SCREENED FOR 11-08-19 OV.

## 2019-11-08 ENCOUNTER — Telehealth: Payer: Self-pay

## 2019-11-08 ENCOUNTER — Ambulatory Visit (INDEPENDENT_AMBULATORY_CARE_PROVIDER_SITE_OTHER): Payer: Medicare Other | Admitting: Nurse Practitioner

## 2019-11-08 ENCOUNTER — Encounter: Payer: Self-pay | Admitting: Nurse Practitioner

## 2019-11-08 ENCOUNTER — Other Ambulatory Visit: Payer: Self-pay

## 2019-11-08 VITALS — BP 138/60 | HR 72 | Resp 16 | Ht 62.0 in | Wt 137.6 lb

## 2019-11-08 DIAGNOSIS — E1165 Type 2 diabetes mellitus with hyperglycemia: Secondary | ICD-10-CM

## 2019-11-08 DIAGNOSIS — I10 Essential (primary) hypertension: Secondary | ICD-10-CM | POA: Diagnosis not present

## 2019-11-08 DIAGNOSIS — E039 Hypothyroidism, unspecified: Secondary | ICD-10-CM | POA: Diagnosis not present

## 2019-11-08 DIAGNOSIS — Z20828 Contact with and (suspected) exposure to other viral communicable diseases: Secondary | ICD-10-CM | POA: Diagnosis not present

## 2019-11-08 LAB — POCT GLYCOSYLATED HEMOGLOBIN (HGB A1C): Hemoglobin A1C: 7.1 % — AB (ref 4.0–5.6)

## 2019-11-08 MED ORDER — LEVOTHYROXINE SODIUM 75 MCG PO TABS: 75.0000 ug | ORAL_TABLET | Freq: Every day | ORAL | 1 refills | Status: DC

## 2019-11-08 NOTE — Progress Notes (Signed)
[facility] [place], [place] [address]  Internal MEDICINE  Office Visit Note  Patient Name: Samantha Franco  [id number]  [id number]  Date of Service: 11/13/2019  Chief Complaint  Patient presents with  . Diabetes  . Hyperlipidemia  . Hypertension    The patient is here for routine follow up. She is diabetic. Blood sugars have been stable. Her HgbA1c is 7.1 today. Unchanged since her last visit. Blood pressure is elevated just a little. She came from another doctor's appointment which was running behind. Recheck of blood pressure is much improved. She has no new concerns or complaints today. She states she is doing well. She is due to have routine, fasting labs and mammogram. She would like to hold off on these tests for now until spread of COVID 19 decreases.       Current Medication: Outpatient Encounter Medications as of 11/08/2019  Medication Sig  . acetaminophen (TYLENOL) 325 MG tablet Take by mouth.  Marland Kitchen allopurinol (ZYLOPRIM) 100 MG tablet Take 1 tablet (100 mg total) by mouth daily. Take one  For gout  . ALPRAZolam (XANAX) 0.25 MG tablet Take 1 tablet (0.25 mg total) by mouth at bedtime as needed for anxiety.  Marland Kitchen amLODipine (NORVASC) 5 MG tablet Take 1 tablet (5 mg total) by mouth daily.  Marland Kitchen aspirin 325 MG tablet Take 325 mg by mouth daily.  . bisoprolol-hydrochlorothiazide (ZIAC) 5-6.25 MG tablet Take 1 tablet by mouth daily.  Marland Kitchen buPROPion (WELLBUTRIN XL) 150 MG 24 hr tablet Take by mouth.  . Continuous Blood Gluc Sensor MISC Use as directed every 14 days. May dispense FreeStyle [employer] or similar. diag E11.65  . docusate sodium (COLACE) 100 MG capsule Take 100 mg by mouth daily.  . fluconazole (DIFLUCAN) 150 MG tablet Take 1 tablet (150 mg total) by mouth once a week.  Marland Kitchen glucose blood (ACCU-CHEK AVIVA PLUS) test strip USE 1 STRIP TO CHECK GLUCOSE TWICE DAILY diag E11.65  . Insulin Detemir (LEVEMIR FLEXTOUCH) 100 UNIT/ML Pen Inject 12 Units  into the skin daily.  Marland Kitchen levothyroxine (SYNTHROID) 75 MCG tablet Take 1 tablet (75 mcg total) by mouth daily.  . memantine (NAMENDA) 5 MG tablet Take 2 tablets (10 mg total) by mouth 2 (two) times daily.  . QUEtiapine (SEROQUEL) 50 MG tablet Take 50 mg by mouth at bedtime.  . terbinafine (LAMISIL) 1 % cream Apply 1 application topically 2 (two) times daily.  . traMADol (ULTRAM) 50 MG tablet Take 1 tablet (50 mg total) by mouth every 8 (eight) hours as needed for moderate pain.  Marland Kitchen triamcinolone (KENALOG) 0.025 % cream Apply 1 application topically 2 (two) times daily.  . [DISCONTINUED] levothyroxine (SYNTHROID) 75 MCG tablet Take 1 tablet (75 mcg total) by mouth daily.  . famotidine (PEPCID) 40 MG tablet Take 1 tablet (40 mg total) by mouth daily.   No facility-administered encounter medications on file as of 11/08/2019.    Surgical History: Past Surgical History:  Procedure Laterality Date  . CARDIAC CATHETERIZATION     [facility]  . CATARACT EXTRACTION    . LUNG LOBECTOMY    . THYROID SURGERY      Medical History: Past Medical History:  Diagnosis Date  . Arthritis   . Chorea   . COPD (chronic obstructive pulmonary disease) ([place])   . Diabetes mellitus without complication ([place])   . Ear infection   . Environmental allergies   . GERD (gastroesophageal reflux disease)   . Hx of rheumatic  fever   . Hx: UTI (urinary tract infection)   . Hyperlipidemia   . Hyperlipidemia   . Hypertension   . Hypothyroidism   . Kidney disease   . Lung cancer ([place])   . Mini stroke ([place])   . Stroke ([place])    x's 2  . Vertigo     Family History: Family History  Problem Relation Age of Onset  . Heart attack Father   . Heart disease Father     Social History   Socioeconomic History  . Marital status: Widowed    Spouse name: Not on file  . Number of children: Not on file  . Years of education: Not on file  . Highest education level: Not on file  Occupational History  . Not on file  Tobacco  Use  . Smoking status: Former Smoker    Types: Cigarettes  . Smokeless tobacco: Current User    Types: Chew  . Tobacco comment: Quit X30 years ago  Substance and Sexual Activity  . Alcohol use: No  . Drug use: No  . Sexual activity: Not on file  Other Topics Concern  . Not on file  Social History Narrative  . Not on file   Social Determinants of Health   Financial Resource Strain:   . Difficulty of Paying Living Expenses: Not on file  Food Insecurity:   . Worried About Charity fundraiser in the Last Year: Not on file  . Ran Out of Food in the Last Year: Not on file  Transportation Needs:   . Lack of Transportation (Medical): Not on file  . Lack of Transportation (Non-Medical): Not on file  Physical Activity:   . Days of Exercise per Week: Not on file  . Minutes of Exercise per Session: Not on file  Stress:   . Feeling of Stress : Not on file  Social Connections:   . Frequency of Communication with Friends and Family: Not on file  . Frequency of Social Gatherings with Friends and Family: Not on file  . Attends Religious Services: Not on file  . Active Member of Clubs or Organizations: Not on file  . Attends Archivist Meetings: Not on file  . Marital Status: Not on file  Intimate Partner Violence:   . Fear of Current or Ex-Partner: Not on file  . Emotionally Abused: Not on file  . Physically Abused: Not on file  . Sexually Abused: Not on file      Review of Systems  Constitutional: Negative for chills, fatigue and unexpected weight change.  HENT: Negative for congestion, rhinorrhea, sneezing and sore throat.   Respiratory: Negative for cough, chest tightness, shortness of breath and wheezing.   Cardiovascular: Negative for chest pain and palpitations.  Gastrointestinal: Negative for abdominal pain, constipation, diarrhea, nausea and vomiting.  Endocrine: Negative for cold intolerance, heat intolerance, polydipsia and polyuria.       Blood sugars stable.     Musculoskeletal: Negative for arthralgias, back pain, joint swelling and neck pain.  Allergic/Immunologic: Negative for environmental allergies.  Neurological: Negative for dizziness, tremors, numbness and headaches.  Hematological: Negative for adenopathy. Does not bruise/bleed easily.  Psychiatric/Behavioral: Negative for behavioral problems and sleep disturbance. The patient is not nervous/anxious.     Today's Vitals   11/08/19 1452  BP: 138/60  Pulse: 72  Resp: 16  SpO2: 98%  Weight: 137 lb 9.6 oz (62.4 kg)  Height: 5\' 2"  (1.575 m)   Body mass index is 25.17 kg/m.  Physical Exam Vitals and nursing note reviewed.  Constitutional:      Appearance: Normal appearance.  HENT:     Head: Normocephalic and atraumatic.     Nose: Nose normal.  Eyes:     Extraocular Movements: Extraocular movements intact.     Conjunctiva/sclera: Conjunctivae normal.     Pupils: Pupils are equal, round, and reactive to light.  Neck:     Vascular: No carotid bruit.  Cardiovascular:     Rate and Rhythm: Normal rate and regular rhythm.     Heart sounds: Normal heart sounds.  Pulmonary:     Effort: Pulmonary effort is normal.     Breath sounds: Normal breath sounds.  Abdominal:     Palpations: Abdomen is soft.  Musculoskeletal:     Cervical back: Normal range of motion and neck supple.  Skin:    General: Skin is warm.  Neurological:     Mental Status: She is alert and oriented to person, place, and time.  Psychiatric:        Mood and Affect: Mood normal.        Behavior: Behavior normal.        Thought Content: Thought content normal.        Judgment: Judgment normal.   Assessment/Plan: 1. Uncontrolled type 2 diabetes mellitus with hyperglycemia (HCC) - POCT HgB A1C 7.1 today. Continue diabetic medication as prescribed.   2. Acquired hypothyroidism Continue levothyroxine as prescribed - levothyroxine (SYNTHROID) 75 MCG tablet; Take 1 tablet (75 mcg total) by mouth daily.  Dispense:  90 tablet; Refill: 1  3. Essential hypertension, benign Generally stable. Continue bp medication as prescribed .  General Counseling: remington skalsky understanding of the findings of todays visit and agrees with plan of treatment. I have discussed any further diagnostic evaluation that may be needed or ordered today. We also reviewed her medications today. she has been encouraged to call the office with any questions or concerns that should arise related to todays visit.  Diabetes Counseling:  1. Addition of ACE inh/ ARB'S for nephroprotection. Microalbumin is updated  2. Diabetic foot care, prevention of complications. Podiatry consult 3. Exercise and lose weight.  4. Diabetic eye examination, Diabetic eye exam is updated  5. Monitor blood sugar closlely. nutrition counseling.  6. Sign and symptoms of hypoglycemia including shaking sweating,confusion and headaches.  This patient was seen by Leretha Pol FNP Collaboration with Dr Lavera Guise as a part of collaborative care agreement  Orders Placed This Encounter  Procedures  . POCT HgB A1C    Meds ordered this encounter  Medications  . levothyroxine (SYNTHROID) 75 MCG tablet    Sig: Take 1 tablet (75 mcg total) by mouth daily.    Dispense:  90 tablet    Refill:  1    Order Specific Question:   Supervising Provider    Answer:   Lavera Guise [[id number]]    Time spent: 24 Minutes      Dr Lavera Guise Internal medicine

## 2019-11-08 NOTE — Telephone Encounter (Signed)
Caller given negative result and verbalized understanding  

## 2019-11-22 ENCOUNTER — Encounter: Payer: Self-pay | Admitting: Internal Medicine

## 2019-11-22 ENCOUNTER — Other Ambulatory Visit: Payer: Self-pay

## 2019-11-22 DIAGNOSIS — F0280 Dementia in other diseases classified elsewhere without behavioral disturbance: Secondary | ICD-10-CM

## 2019-11-22 MED ORDER — MEMANTINE HCL 5 MG PO TABS: 10.0000 mg | ORAL_TABLET | Freq: Two times a day (BID) | ORAL | 1 refills | Status: DC

## 2019-11-23 ENCOUNTER — Other Ambulatory Visit: Payer: Self-pay | Admitting: Internal Medicine

## 2019-11-23 MED ORDER — BUPROPION HCL ER (XL) 300 MG PO TB24: ORAL_TABLET | ORAL | 3 refills | Status: DC

## 2019-11-29 ENCOUNTER — Telehealth: Payer: Self-pay

## 2019-11-29 NOTE — Telephone Encounter (Signed)
PRIOR AUTHORIZATION FOR MEMANTINE HAS BEEN APPROVED. VALID FROM 08/31/2019-11/10/2020.TAT

## 2019-12-09 ENCOUNTER — Telehealth: Payer: Self-pay

## 2019-12-09 NOTE — Telephone Encounter (Signed)
appt schd for 12/10/19

## 2019-12-09 NOTE — Telephone Encounter (Signed)
PT CAREGIVER CALLED PER DAUGHTER'S REQUEST AND PT STATED THAT SHE DID NOT FEEL IN CONTROL AND HAS NOT FELT LIKE THIS BEFORE. PT COULD NOT SPECIFY THE WAY SHE WAS FEELING.  PT CAREGIVER STATED THAT PT TOOK A XANAX YESTERDAY AROUND 5/6 PM, THEN AGAIN AT 1 AM, AND THEN AGAIN TODAY AT 2:15 PM, ALONG WITH HER BUPROPION 30 MG 24 HR TAB AT 9 AM AND THEN ACCIDENTALLY ANOTHER BUPROPION 150 MG AT 11 AM.  CALLED DR. FOZIA KHAN AND NOTIFIED HER OF THE ABOVE.  CALLED PT AND CAREGIVER BACK AND ADVISED THAT PT SHOULD STOP TAKING BUPROPION AND TAKE XANAX THREE TIMES DAILY PRN FOR ANXIETY AND THAT SHE WILL NEED TO BE SEEN BY DR. Latricia Heft  IN OFFICE ON Tuesday. GAVE CALL TO BETH TO MAKE APPT.   ADVISED PT AND CAREGIVER TO GIVE [place] A CALL ON OUR LINE AFTER HOURS FOR ANY FURTHER QUESTIONS/ CONCERNS THEY MAY HAVE.

## 2019-12-10 ENCOUNTER — Ambulatory Visit (INDEPENDENT_AMBULATORY_CARE_PROVIDER_SITE_OTHER): Payer: Medicare Other | Admitting: Adult Health

## 2019-12-10 ENCOUNTER — Encounter: Payer: Self-pay | Admitting: Adult Health

## 2019-12-10 VITALS — BP 136/68 | HR 85 | Wt 135.0 lb

## 2019-12-10 DIAGNOSIS — F411 Generalized anxiety disorder: Secondary | ICD-10-CM | POA: Diagnosis not present

## 2019-12-10 DIAGNOSIS — I10 Essential (primary) hypertension: Secondary | ICD-10-CM

## 2019-12-10 DIAGNOSIS — G301 Alzheimer's disease with late onset: Secondary | ICD-10-CM | POA: Diagnosis not present

## 2019-12-10 DIAGNOSIS — Z20822 Contact with and (suspected) exposure to covid-19: Secondary | ICD-10-CM | POA: Diagnosis not present

## 2019-12-10 DIAGNOSIS — F0280 Dementia in other diseases classified elsewhere without behavioral disturbance: Secondary | ICD-10-CM | POA: Diagnosis not present

## 2019-12-10 MED ORDER — ESCITALOPRAM OXALATE 10 MG PO TABS: 10.0000 mg | ORAL_TABLET | Freq: Every day | ORAL | 1 refills | Status: DC

## 2019-12-10 NOTE — Progress Notes (Signed)
[facility] [place], [place] [address]  Internal MEDICINE  Telephone Visit  Patient Name: Samantha Franco  [id number]  [id number]  Date of Service: 12/10/2019  I connected with the patient at 953 by telephone and verified the patients identity using two identifiers.   I discussed the limitations, risks, security and privacy concerns of performing an evaluation and management service by telephone and the availability of in person appointments. I also discussed with the patient that there may be a patient responsible charge related to the service.  The patient expressed understanding and agrees to proceed.    Chief Complaint  Patient presents with  . Telephone Assessment  . Telephone Screen  . Diabetes    BLOOD SUGAR: 130  . Anxiety    DISCUSS ANXIETY ISSUES AND MEDICATION. [facility] HAS STOPPED HER ON HER WELLBUTRIN FOR NOW AND PUT HER ON XANAX TID PRN FOR ANXIETY, I HAVE A NOTE IN ENCOUNTER FOR MORE INFO     HPI Pt seen via telephone today. Pt reports she is "not feeling well"  She reports feeling "like i'm going to go all to pieces"  She reports she is tired but care giver reports she sleep completely all night.  She reports not being able to sit still.  She is having trouble keeping her mind occupied. She denies any sob, edema, or chest pain. Her Wellbutrin was recently stopped, and she is only taking xanax.  She has not had xanax since 2pm.         Current Medication: Outpatient Encounter Medications as of 12/10/2019  Medication Sig  . acetaminophen (TYLENOL) 325 MG tablet Take by mouth.  Marland Kitchen allopurinol (ZYLOPRIM) 100 MG tablet Take 1 tablet (100 mg total) by mouth daily. Take one  For gout (Patient taking differently: Take 100 mg by mouth daily. Take 1.5 TAB For gout)  . ALPRAZolam (XANAX) 0.25 MG tablet Take 1 tablet (0.25 mg total) by mouth at bedtime as needed for anxiety.  Marland Kitchen amLODipine (NORVASC) 5 MG tablet Take 1 tablet (5 mg total) by mouth daily.  Marland Kitchen  aspirin 325 MG tablet Take 325 mg by mouth daily.  . bisoprolol-hydrochlorothiazide (ZIAC) 5-6.25 MG tablet Take 1 tablet by mouth daily.  . Continuous Blood Gluc Sensor MISC Use as directed every 14 days. May dispense FreeStyle [employer] or similar. diag E11.65  . docusate sodium (COLACE) 100 MG capsule Take 100 mg by mouth daily.  Marland Kitchen glucose blood (ACCU-CHEK AVIVA PLUS) test strip USE 1 STRIP TO CHECK GLUCOSE TWICE DAILY diag E11.65  . Insulin Detemir (LEVEMIR FLEXTOUCH) 100 UNIT/ML Pen Inject 12 Units into the skin daily.  Marland Kitchen levothyroxine (SYNTHROID) 75 MCG tablet Take 1 tablet (75 mcg total) by mouth daily.  . memantine (NAMENDA) 5 MG tablet Take 2 tablets (10 mg total) by mouth 2 (two) times daily.  . QUEtiapine (SEROQUEL) 50 MG tablet Take 50 mg by mouth at bedtime.  . terbinafine (LAMISIL) 1 % cream Apply 1 application topically 2 (two) times daily.  . traMADol (ULTRAM) 50 MG tablet Take 1 tablet (50 mg total) by mouth every 8 (eight) hours as needed for moderate pain.  Marland Kitchen triamcinolone (KENALOG) 0.025 % cream Apply 1 application topically 2 (two) times daily.  Marland Kitchen buPROPion (WELLBUTRIN XL) 300 MG 24 hr tablet Take one tab daily for depression (Patient not taking: Reported on 12/10/2019)  . escitalopram (LEXAPRO) 10 MG tablet Take 1 tablet (10 mg total) by mouth daily.  . famotidine (PEPCID)  40 MG tablet Take 1 tablet (40 mg total) by mouth daily.  . [DISCONTINUED] fluconazole (DIFLUCAN) 150 MG tablet Take 1 tablet (150 mg total) by mouth once a week. (Patient not taking: Reported on 12/10/2019)   No facility-administered encounter medications on file as of 12/10/2019.    Surgical History: Past Surgical History:  Procedure Laterality Date  . CARDIAC CATHETERIZATION     [facility]  . CATARACT EXTRACTION    . LUNG LOBECTOMY    . THYROID SURGERY      Medical History: Past Medical History:  Diagnosis Date  . Arthritis   . Chorea   . COPD (chronic obstructive pulmonary disease)  ([place])   . Diabetes mellitus without complication ([place])   . Ear infection   . Environmental allergies   . GERD (gastroesophageal reflux disease)   . Hx of rheumatic fever   . Hx: UTI (urinary tract infection)   . Hyperlipidemia   . Hyperlipidemia   . Hypertension   . Hypothyroidism   . Kidney disease   . Lung cancer ([place])   . Mini stroke ([place])   . Stroke ([place])    x's 2  . Vertigo     Family History: Family History  Problem Relation Age of Onset  . Heart attack Father   . Heart disease Father     Social History   Socioeconomic History  . Marital status: Widowed    Spouse name: Not on file  . Number of children: Not on file  . Years of education: Not on file  . Highest education level: Not on file  Occupational History  . Not on file  Tobacco Use  . Smoking status: Former Smoker    Types: Cigarettes  . Smokeless tobacco: Current User    Types: Chew  . Tobacco comment: Quit X30 years ago  Substance and Sexual Activity  . Alcohol use: No  . Drug use: No  . Sexual activity: Not on file  Other Topics Concern  . Not on file  Social History Narrative  . Not on file   Social Determinants of Health   Financial Resource Strain:   . Difficulty of Paying Living Expenses: Not on file  Food Insecurity:   . Worried About Charity fundraiser in the Last Year: Not on file  . Ran Out of Food in the Last Year: Not on file  Transportation Needs:   . Lack of Transportation (Medical): Not on file  . Lack of Transportation (Non-Medical): Not on file  Physical Activity:   . Days of Exercise per Week: Not on file  . Minutes of Exercise per Session: Not on file  Stress:   . Feeling of Stress : Not on file  Social Connections:   . Frequency of Communication with Friends and Family: Not on file  . Frequency of Social Gatherings with Friends and Family: Not on file  . Attends Religious Services: Not on file  . Active Member of Clubs or Organizations: Not on file  . Attends  Archivist Meetings: Not on file  . Marital Status: Not on file  Intimate Partner Violence:   . Fear of Current or Ex-Partner: Not on file  . Emotionally Abused: Not on file  . Physically Abused: Not on file  . Sexually Abused: Not on file      Review of Systems  Constitutional: Negative for chills, fatigue and unexpected weight change.  HENT: Negative for congestion, rhinorrhea, sneezing and sore throat.   Eyes: Negative  for photophobia, pain and redness.  Respiratory: Negative for cough, chest tightness and shortness of breath.   Cardiovascular: Negative for chest pain and palpitations.  Gastrointestinal: Negative for abdominal pain, constipation, diarrhea, nausea and vomiting.  Endocrine: Negative.   Genitourinary: Negative for dysuria and frequency.  Musculoskeletal: Negative for arthralgias, back pain, joint swelling and neck pain.  Skin: Negative for rash.  Allergic/Immunologic: Negative.   Neurological: Negative for tremors and numbness.  Hematological: Negative for adenopathy. Does not bruise/bleed easily.  Psychiatric/Behavioral: Negative for behavioral problems and sleep disturbance. The patient is not nervous/anxious.     Vital Signs: BP 136/68   Pulse 85   Wt 135 lb (61.2 kg)   BMI 24.69 kg/m    Observation/Objective:  Well sounding, NAD noted.   Assessment/Plan: 1. GAD (generalized anxiety disorder) Start Lexapro, and see psychiatry as discussed.  - escitalopram (LEXAPRO) 10 MG tablet; Take 1 tablet (10 mg total) by mouth daily.  Dispense: 30 tablet; Refill: 1 - Ambulatory referral to Psychiatry  2. Essential hypertension, benign Stable, continue present management.  3. Late onset Alzheimer's disease without behavioral disturbance ([place]) Continue to take [place] as scheduled.  General Counseling: nataliee shurtz understanding of the findings of today's phone visit and agrees with plan of treatment. I have discussed any further diagnostic  evaluation that may be needed or ordered today. We also reviewed her medications today. she has been encouraged to call the office with any questions or concerns that should arise related to todays visit.    Orders Placed This Encounter  Procedures  . Ambulatory referral to Psychiatry    Meds ordered this encounter  Medications  . escitalopram (LEXAPRO) 10 MG tablet    Sig: Take 1 tablet (10 mg total) by mouth daily.    Dispense:  30 tablet    Refill:  1    Time spent: [place] AGNP-C Internal medicine

## 2019-12-14 ENCOUNTER — Ambulatory Visit: Payer: Medicare Other | Admitting: Internal Medicine

## 2020-01-19 ENCOUNTER — Other Ambulatory Visit: Payer: Self-pay | Admitting: Adult Health

## 2020-01-26 ENCOUNTER — Other Ambulatory Visit: Payer: Self-pay | Admitting: Internal Medicine

## 2020-01-26 DIAGNOSIS — F0280 Dementia in other diseases classified elsewhere without behavioral disturbance: Secondary | ICD-10-CM

## 2020-02-02 ENCOUNTER — Telehealth: Payer: Self-pay

## 2020-02-02 NOTE — Telephone Encounter (Signed)
VM FULL UNABLE TO CONFIRM 02-07-20 OV.

## 2020-02-07 ENCOUNTER — Other Ambulatory Visit: Payer: Self-pay

## 2020-02-07 ENCOUNTER — Encounter: Payer: Self-pay | Admitting: Nurse Practitioner

## 2020-02-07 ENCOUNTER — Ambulatory Visit (INDEPENDENT_AMBULATORY_CARE_PROVIDER_SITE_OTHER): Payer: Medicare Other | Admitting: Nurse Practitioner

## 2020-02-07 VITALS — BP 140/63 | HR 68 | Temp 97.7°F | Resp 16 | Ht 62.0 in | Wt 132.0 lb

## 2020-02-07 DIAGNOSIS — K0510 Chronic gingivitis, plaque induced: Secondary | ICD-10-CM

## 2020-02-07 DIAGNOSIS — G301 Alzheimer's disease with late onset: Secondary | ICD-10-CM

## 2020-02-07 DIAGNOSIS — F0280 Dementia in other diseases classified elsewhere without behavioral disturbance: Secondary | ICD-10-CM | POA: Diagnosis not present

## 2020-02-07 DIAGNOSIS — F411 Generalized anxiety disorder: Secondary | ICD-10-CM | POA: Diagnosis not present

## 2020-02-07 DIAGNOSIS — K1230 Oral mucositis (ulcerative), unspecified: Secondary | ICD-10-CM | POA: Diagnosis not present

## 2020-02-07 DIAGNOSIS — E1165 Type 2 diabetes mellitus with hyperglycemia: Secondary | ICD-10-CM

## 2020-02-07 LAB — POCT GLYCOSYLATED HEMOGLOBIN (HGB A1C): Hemoglobin A1C: 6.8 % — AB (ref 4.0–5.6)

## 2020-02-07 MED ORDER — AMOXICILLIN 875 MG PO TABS: 875.0000 mg | ORAL_TABLET | Freq: Two times a day (BID) | ORAL | 0 refills | Status: DC

## 2020-02-07 MED ORDER — CHLORHEXIDINE GLUCONATE 0.12 % MT SOLN: 15.0000 mL | Freq: Two times a day (BID) | OROMUCOSAL | 2 refills | Status: DC

## 2020-02-07 MED ORDER — QUETIAPINE FUMARATE 50 MG PO TABS: 50.0000 mg | ORAL_TABLET | Freq: Every day | ORAL | 1 refills | Status: DC

## 2020-02-07 MED ORDER — MEMANTINE HCL 5 MG PO TABS: 10.0000 mg | ORAL_TABLET | Freq: Two times a day (BID) | ORAL | 1 refills | Status: DC

## 2020-02-07 NOTE — Progress Notes (Signed)
[facility] [place], [place] [address]  Internal MEDICINE  Office Visit Note  Patient Name: Samantha Franco  [id number]  [id number]  Date of Service: 02/20/2020  Chief Complaint  Patient presents with  . Hypertension  . Diabetes  . Hyperlipidemia  . Gastroesophageal Reflux  . Anxiety  . Sore    mouth sore  . Medication Management    no longer on any antidepressants, does not stay as active  . Medication Refill    seroquel, bisoprolol hctz, namenda    The patient is here for routine visit. Has sore lower part of the mouth, where the tongue touches the teeth. This is white in color and tender. Mucus membrane is intact and there is no drainage present. Blood sugars are improved. Her HgbA1c is 6.8, down from 7.4 at last check. Her blood pressure is well managed. She states that she is no longer on daily antidepressant therapy. He does take seroquel at night to help her sleep despite her anxiety. She also takes namanda to help slow the progression of memory loss.       Current Medication: Outpatient Encounter Medications as of 02/07/2020  Medication Sig  . acetaminophen (TYLENOL) 325 MG tablet Take by mouth.  Marland Kitchen allopurinol (ZYLOPRIM) 100 MG tablet TAKE 1 & 1/2 (ONE & ONE-HALF) TABLETS BY MOUTH AT NIGHT FOR GOUT  . ALPRAZolam (XANAX) 0.25 MG tablet Take 1 tablet (0.25 mg total) by mouth at bedtime as needed for anxiety.  Marland Kitchen amLODipine (NORVASC) 5 MG tablet Take 1 tablet (5 mg total) by mouth daily.  Marland Kitchen aspirin 325 MG tablet Take 325 mg by mouth daily.  . bisoprolol-hydrochlorothiazide (ZIAC) 5-6.25 MG tablet Take 1 tablet by mouth daily.  . Continuous Blood Gluc Sensor MISC Use as directed every 14 days. May dispense FreeStyle [employer] or similar. diag E11.65  . docusate sodium (COLACE) 100 MG capsule Take 100 mg by mouth daily.  Marland Kitchen glucose blood (ACCU-CHEK AVIVA PLUS) test strip USE 1 STRIP TO CHECK GLUCOSE TWICE DAILY diag E11.65  . Insulin  Detemir (LEVEMIR FLEXTOUCH) 100 UNIT/ML Pen Inject 12 Units into the skin daily.  Marland Kitchen levothyroxine (EUTHYROX) 75 MCG tablet Take 75 mcg by mouth daily before breakfast.  . memantine (NAMENDA) 5 MG tablet Take 2 tablets (10 mg total) by mouth 2 (two) times daily.  . QUEtiapine (SEROQUEL) 50 MG tablet Take 1 tablet (50 mg total) by mouth at bedtime.  . terbinafine (LAMISIL) 1 % cream Apply 1 application topically 2 (two) times daily.  . traMADol (ULTRAM) 50 MG tablet Take 1 tablet (50 mg total) by mouth every 8 (eight) hours as needed for moderate pain.  Marland Kitchen triamcinolone (KENALOG) 0.025 % cream Apply 1 application topically 2 (two) times daily.  . [DISCONTINUED] memantine (NAMENDA) 5 MG tablet Take 2 tablets by mouth twice daily  . [DISCONTINUED] QUEtiapine (SEROQUEL) 50 MG tablet Take 50 mg by mouth at bedtime.  Marland Kitchen amoxicillin (AMOXIL) 875 MG tablet Take 1 tablet (875 mg total) by mouth 2 (two) times daily.  Marland Kitchen buPROPion (WELLBUTRIN XL) 300 MG 24 hr tablet Take one tab daily for depression (Patient not taking: Reported on 02/07/2020)  . chlorhexidine (PERIDEX) 0.12 % solution Use as directed 15 mLs in the mouth or throat 2 (two) times daily.  Marland Kitchen escitalopram (LEXAPRO) 10 MG tablet Take 1 tablet (10 mg total) by mouth daily. (Patient not taking: Reported on 02/07/2020)  . famotidine (PEPCID) 40 MG tablet Take 1 tablet (40  mg total) by mouth daily.  . [DISCONTINUED] levothyroxine (SYNTHROID) 75 MCG tablet Take 1 tablet (75 mcg total) by mouth daily. (Patient not taking: Reported on 02/07/2020)   No facility-administered encounter medications on file as of 02/07/2020.    Surgical History: Past Surgical History:  Procedure Laterality Date  . CARDIAC CATHETERIZATION     [facility]  . CATARACT EXTRACTION    . LUNG LOBECTOMY    . THYROID SURGERY      Medical History: Past Medical History:  Diagnosis Date  . Arthritis   . Chorea   . COPD (chronic obstructive pulmonary disease) ([place])   . Diabetes mellitus  without complication ([employer])   . Ear infection   . Environmental allergies   . GERD (gastroesophageal reflux disease)   . Hx of rheumatic fever   . Hx: UTI (urinary tract infection)   . Hyperlipidemia   . Hyperlipidemia   . Hypertension   . Hypothyroidism   . Kidney disease   . Lung cancer ([place])   . Mini stroke ([place])   . Stroke ([place])    x's 2  . Vertigo     Family History: Family History  Problem Relation Age of Onset  . Heart attack Father   . Heart disease Father     Social History   Socioeconomic History  . Marital status: Widowed    Spouse name: Not on file  . Number of children: Not on file  . Years of education: Not on file  . Highest education level: Not on file  Occupational History  . Not on file  Tobacco Use  . Smoking status: Former Smoker    Types: Cigarettes  . Smokeless tobacco: Current User    Types: Chew  . Tobacco comment: Quit X30 years ago  Substance and Sexual Activity  . Alcohol use: No  . Drug use: No  . Sexual activity: Not on file  Other Topics Concern  . Not on file  Social History Narrative  . Not on file   Social Determinants of Health   Financial Resource Strain:   . Difficulty of Paying Living Expenses:   Food Insecurity:   . Worried About Charity fundraiser in the Last Year:   . Arboriculturist in the Last Year:   Transportation Needs:   . Film/video editor (Medical):   Marland Kitchen Lack of Transportation (Non-Medical):   Physical Activity:   . Days of Exercise per Week:   . Minutes of Exercise per Session:   Stress:   . Feeling of Stress :   Social Connections:   . Frequency of Communication with Friends and Family:   . Frequency of Social Gatherings with Friends and Family:   . Attends Religious Services:   . Active Member of Clubs or Organizations:   . Attends Archivist Meetings:   Marland Kitchen Marital Status:   Intimate Partner Violence:   . Fear of Current or Ex-Partner:   . Emotionally Abused:   Marland Kitchen Physically  Abused:   . Sexually Abused:       Review of Systems  Constitutional: Negative for chills, fatigue and unexpected weight change.  HENT: Positive for mouth sores. Negative for congestion, rhinorrhea, sneezing and sore throat.   Respiratory: Negative for cough, chest tightness, shortness of breath and wheezing.   Cardiovascular: Negative for chest pain and palpitations.  Gastrointestinal: Negative for abdominal pain, constipation, diarrhea, nausea and vomiting.  Endocrine: Negative for cold intolerance, heat intolerance, polydipsia and polyuria.  Blood sugars stable.  Continues to take levothyroxine.   Musculoskeletal: Negative for arthralgias, back pain, joint swelling and neck pain.  Allergic/Immunologic: Negative for environmental allergies.  Neurological: Positive for weakness. Negative for dizziness, tremors, numbness and headaches.  Hematological: Negative for adenopathy. Does not bruise/bleed easily.  Psychiatric/Behavioral: Positive for sleep disturbance. Negative for behavioral problems. The patient is nervous/anxious.     Today's Vitals   02/07/20 1355  BP: 140/63  Pulse: 68  Resp: 16  Temp: 97.7 F (36.5 C)  SpO2: 98%  Weight: 132 lb (59.9 kg)  Height: 5\' 2"  (1.575 m)   Body mass index is 24.14 kg/m.  Physical Exam Vitals and nursing note reviewed.  Constitutional:      General: She is not in acute distress.    Appearance: Normal appearance. She is well-developed. She is not diaphoretic.  HENT:     Head: Normocephalic and atraumatic.     Mouth/Throat:     Tongue: Lesions present.     Pharynx: No oropharyngeal exudate.   Eyes:     Pupils: Pupils are equal, round, and reactive to light.  Neck:     Thyroid: No thyromegaly.     Vascular: No JVD.     Trachea: No tracheal deviation.  Cardiovascular:     Rate and Rhythm: Normal rate and regular rhythm.     Heart sounds: Normal heart sounds. No murmur. No friction rub. No gallop.   Pulmonary:     Effort:  Pulmonary effort is normal. No respiratory distress.     Breath sounds: No wheezing or rales.  Chest:     Chest wall: No tenderness.  Abdominal:     General: Bowel sounds are normal.     Palpations: Abdomen is soft.  Musculoskeletal:        General: Normal range of motion.     Cervical back: Normal range of motion and neck supple.  Lymphadenopathy:     Cervical: No cervical adenopathy.  Skin:    General: Skin is warm and dry.  Neurological:     Mental Status: She is alert and oriented to person, place, and time.     Cranial Nerves: No cranial nerve deficit.  Psychiatric:        Behavior: Behavior normal.        Thought Content: Thought content normal.        Judgment: Judgment normal.   Assessment/Plan: 1. Mucositis oral Start amoxicillin 875mg  bid for 10 days. Recommend she contact her dentist for further evaluation. - amoxicillin (AMOXIL) 875 MG tablet; Take 1 tablet (875 mg total) by mouth 2 (two) times daily.  Dispense: 20 tablet; Refill: 0  2. Gingivitis Start amoxicillin 875mg  bid for 10 days. Add chlorhexadine mouth rinse. Swish and spit with 29mls twice daily as needed. Recommend she contact her dentist for further evaluation. - amoxicillin (AMOXIL) 875 MG tablet; Take 1 tablet (875 mg total) by mouth 2 (two) times daily.  Dispense: 20 tablet; Refill: 0 - chlorhexidine (PERIDEX) 0.12 % solution; Use as directed 15 mLs in the mouth or throat 2 (two) times daily.  Dispense: 120 mL; Refill: 2  3. Type 2 diabetes mellitus with hyperglycemia, without long-term current use of insulin (HCC) - POCT HgB A1C 6.8 today. Continue diabetic medication as prescribed   4. Late onset Alzheimer's disease without behavioral disturbance (HCC) Generally stable. Continue namenda 10mg  twice daily.  - memantine (NAMENDA) 5 MG tablet; Take 2 tablets (10 mg total) by mouth 2 (two) times daily.  Dispense: 360 tablet; Refill: 1  5. GAD (generalized anxiety disorder) May take seroquel 50mg  at  bedtime as needed for anxiety/insomnia.  - QUEtiapine (SEROQUEL) 50 MG tablet; Take 1 tablet (50 mg total) by mouth at bedtime.  Dispense: 90 tablet; Refill: 1  General Counseling: Gerlean verbalizes understanding of the findings of todays visit and agrees with plan of treatment. I have discussed any further diagnostic evaluation that may be needed or ordered today. We also reviewed her medications today. she has been encouraged to call the office with any questions or concerns that should arise related to todays visit.  This patient was seen by Leretha Pol FNP Collaboration with Dr Lavera Guise as a part of collaborative care agreement  Orders Placed This Encounter  Procedures  . POCT HgB A1C    Meds ordered this encounter  Medications  . amoxicillin (AMOXIL) 875 MG tablet    Sig: Take 1 tablet (875 mg total) by mouth 2 (two) times daily.    Dispense:  20 tablet    Refill:  0    Order Specific Question:   Supervising Provider    Answer:   Lavera Guise [[id number]]  . chlorhexidine (PERIDEX) 0.12 % solution    Sig: Use as directed 15 mLs in the mouth or throat 2 (two) times daily.    Dispense:  120 mL    Refill:  2    Order Specific Question:   Supervising Provider    Answer:   Lavera Guise [[id number]]  . QUEtiapine (SEROQUEL) 50 MG tablet    Sig: Take 1 tablet (50 mg total) by mouth at bedtime.    Dispense:  90 tablet    Refill:  1    Order Specific Question:   Supervising Provider    Answer:   Lavera Guise [[id number]]  . memantine (NAMENDA) 5 MG tablet    Sig: Take 2 tablets (10 mg total) by mouth 2 (two) times daily.    Dispense:  360 tablet    Refill:  1    Please fill as 90 day prescription    Order Specific Question:   Supervising Provider    Answer:   Lavera Guise [[id number]]    Total time spent: 25 Minutes   Time spent includes review of chart, medications, test results, and follow up plan with the patient.      Dr Lavera Guise Internal medicine

## 2020-02-18 ENCOUNTER — Telehealth: Payer: Self-pay

## 2020-02-18 DIAGNOSIS — R609 Edema, unspecified: Secondary | ICD-10-CM | POA: Diagnosis not present

## 2020-02-18 DIAGNOSIS — R591 Generalized enlarged lymph nodes: Secondary | ICD-10-CM | POA: Diagnosis not present

## 2020-02-18 NOTE — Telephone Encounter (Signed)
Pt daughter called stating that pt left leg is very swollen from her ankle to her hip and patient has two knots on the side of her neck. Pt daughter wanted pt to be seen, but I advised pt daughter that it is best that patient goes to urgent care or the ER based on pt issues because they will be able to do more testing. Pt daughter understood.

## 2020-02-20 DIAGNOSIS — F411 Generalized anxiety disorder: Secondary | ICD-10-CM | POA: Insufficient documentation

## 2020-02-20 DIAGNOSIS — K1230 Oral mucositis (ulcerative), unspecified: Secondary | ICD-10-CM | POA: Insufficient documentation

## 2020-02-20 DIAGNOSIS — E1165 Type 2 diabetes mellitus with hyperglycemia: Secondary | ICD-10-CM | POA: Insufficient documentation

## 2020-02-20 DIAGNOSIS — K0510 Chronic gingivitis, plaque induced: Secondary | ICD-10-CM | POA: Insufficient documentation

## 2020-02-20 DIAGNOSIS — E119 Type 2 diabetes mellitus without complications: Secondary | ICD-10-CM | POA: Insufficient documentation

## 2020-03-14 ENCOUNTER — Other Ambulatory Visit: Payer: Self-pay | Admitting: Internal Medicine

## 2020-03-14 DIAGNOSIS — I10 Essential (primary) hypertension: Secondary | ICD-10-CM

## 2020-03-14 MED ORDER — AMLODIPINE BESYLATE 5 MG PO TABS: 5.0000 mg | ORAL_TABLET | Freq: Every day | ORAL | 1 refills | Status: DC

## 2020-03-27 DIAGNOSIS — F0151 Vascular dementia with behavioral disturbance: Secondary | ICD-10-CM | POA: Diagnosis not present

## 2020-03-27 DIAGNOSIS — R9082 White matter disease, unspecified: Secondary | ICD-10-CM | POA: Diagnosis not present

## 2020-03-27 DIAGNOSIS — G309 Alzheimer's disease, unspecified: Secondary | ICD-10-CM | POA: Diagnosis not present

## 2020-03-27 DIAGNOSIS — R0989 Other specified symptoms and signs involving the circulatory and respiratory systems: Secondary | ICD-10-CM | POA: Diagnosis not present

## 2020-05-05 ENCOUNTER — Telehealth: Payer: Self-pay

## 2020-05-05 NOTE — Telephone Encounter (Signed)
Lmom to confirm and screen for 05-09-20 ov.

## 2020-05-08 ENCOUNTER — Other Ambulatory Visit: Payer: Self-pay | Admitting: Adult Health

## 2020-05-08 DIAGNOSIS — E1165 Type 2 diabetes mellitus with hyperglycemia: Secondary | ICD-10-CM

## 2020-05-09 ENCOUNTER — Ambulatory Visit: Payer: Medicare Other | Admitting: Nurse Practitioner

## 2020-05-11 ENCOUNTER — Telehealth: Payer: Self-pay

## 2020-05-11 NOTE — Telephone Encounter (Signed)
UNABLE TO REACH PT REGARDING MISSED APPT.

## 2020-05-16 ENCOUNTER — Other Ambulatory Visit: Payer: Self-pay

## 2020-05-16 MED ORDER — ALLOPURINOL 100 MG PO TABS: ORAL_TABLET | ORAL | 0 refills | Status: DC

## 2020-05-23 ENCOUNTER — Telehealth: Payer: Self-pay

## 2020-05-23 NOTE — Telephone Encounter (Signed)
Confirmed and screened for 05-25-20 ov.

## 2020-05-23 NOTE — Telephone Encounter (Signed)
LVM TO CONFIRM PT APPT.-AR

## 2020-05-25 ENCOUNTER — Encounter: Payer: Self-pay | Admitting: Nurse Practitioner

## 2020-05-25 ENCOUNTER — Ambulatory Visit (INDEPENDENT_AMBULATORY_CARE_PROVIDER_SITE_OTHER): Payer: Medicare Other | Admitting: Nurse Practitioner

## 2020-05-25 ENCOUNTER — Other Ambulatory Visit: Payer: Self-pay

## 2020-05-25 VITALS — BP 154/67 | HR 63 | Temp 97.9°F | Resp 16 | Ht 62.0 in | Wt 133.2 lb

## 2020-05-25 DIAGNOSIS — G301 Alzheimer's disease with late onset: Secondary | ICD-10-CM | POA: Diagnosis not present

## 2020-05-25 DIAGNOSIS — I10 Essential (primary) hypertension: Secondary | ICD-10-CM | POA: Diagnosis not present

## 2020-05-25 DIAGNOSIS — Z794 Long term (current) use of insulin: Secondary | ICD-10-CM

## 2020-05-25 DIAGNOSIS — E039 Hypothyroidism, unspecified: Secondary | ICD-10-CM

## 2020-05-25 DIAGNOSIS — F0280 Dementia in other diseases classified elsewhere without behavioral disturbance: Secondary | ICD-10-CM | POA: Diagnosis not present

## 2020-05-25 DIAGNOSIS — E1165 Type 2 diabetes mellitus with hyperglycemia: Secondary | ICD-10-CM

## 2020-05-25 LAB — POCT GLYCOSYLATED HEMOGLOBIN (HGB A1C): Hemoglobin A1C: 6.7 % — AB (ref 4.0–5.6)

## 2020-05-25 NOTE — Progress Notes (Signed)
[facility] [place], [place] [address]  Internal MEDICINE  Office Visit Note  Patient Name: Samantha Franco  [id number]  [id number]  Date of Service: 06/07/2020  Chief Complaint  Patient presents with  . Follow-up  . Diabetes  . Gastroesophageal Reflux  . Hypertension  . Hyperlipidemia    The patient is here for routine follow up visit. The patient is diabetic and her blood sugars are well managed. Her HgbA1c is 6.7 today. Her blood pressure is mildly elevated, but is generally well controlled. She states that she is feeling well, overall. She has no concerns or complaints today.       Current Medication: Outpatient Encounter Medications as of 05/25/2020  Medication Sig  . acetaminophen (TYLENOL) 325 MG tablet Take by mouth.  Marland Kitchen allopurinol (ZYLOPRIM) 100 MG tablet TAKE 1 & 1/2 (ONE & ONE-HALF) TABLETS BY MOUTH NIGHTLY FOR  GOUT  . ALPRAZolam (XANAX) 0.25 MG tablet Take 1 tablet (0.25 mg total) by mouth at bedtime as needed for anxiety.  Marland Kitchen amLODipine (NORVASC) 5 MG tablet Take 1 tablet (5 mg total) by mouth daily.  Marland Kitchen amoxicillin (AMOXIL) 875 MG tablet Take 1 tablet (875 mg total) by mouth 2 (two) times daily.  Marland Kitchen aspirin 325 MG tablet Take 325 mg by mouth daily.  . bisoprolol-hydrochlorothiazide (ZIAC) 5-6.25 MG tablet Take 1 tablet by mouth daily.  Marland Kitchen buPROPion (WELLBUTRIN XL) 300 MG 24 hr tablet Take one tab daily for depression  . chlorhexidine (PERIDEX) 0.12 % solution Use as directed 15 mLs in the mouth or throat 2 (two) times daily.  . Continuous Blood Gluc Sensor MISC Use as directed every 14 days. May dispense FreeStyle [employer] or similar. diag E11.65  . docusate sodium (COLACE) 100 MG capsule Take 100 mg by mouth daily.  Marland Kitchen escitalopram (LEXAPRO) 10 MG tablet Take 1 tablet (10 mg total) by mouth daily.  Marland Kitchen glucose blood (ACCU-CHEK AVIVA PLUS) test strip USE 1 STRIP TO CHECK GLUCOSE TWICE DAILY diag E11.65  . LEVEMIR FLEXTOUCH 100  UNIT/ML FlexPen INJECT 12 UNITS SUBCUTANEOUSLY ONCE DAILY  . levothyroxine (EUTHYROX) 75 MCG tablet Take 75 mcg by mouth daily before breakfast.  . memantine (NAMENDA) 5 MG tablet Take 2 tablets (10 mg total) by mouth 2 (two) times daily.  . QUEtiapine (SEROQUEL) 50 MG tablet Take 1 tablet (50 mg total) by mouth at bedtime.  . terbinafine (LAMISIL) 1 % cream Apply 1 application topically 2 (two) times daily.  . traMADol (ULTRAM) 50 MG tablet Take 1 tablet (50 mg total) by mouth every 8 (eight) hours as needed for moderate pain.  Marland Kitchen triamcinolone (KENALOG) 0.025 % cream Apply 1 application topically 2 (two) times daily.  . famotidine (PEPCID) 40 MG tablet Take 1 tablet (40 mg total) by mouth daily.   No facility-administered encounter medications on file as of 05/25/2020.    Surgical History: Past Surgical History:  Procedure Laterality Date  . CARDIAC CATHETERIZATION     [facility]  . CATARACT EXTRACTION    . LUNG LOBECTOMY    . THYROID SURGERY      Medical History: Past Medical History:  Diagnosis Date  . Arthritis   . Chorea   . COPD (chronic obstructive pulmonary disease) ([place])   . Diabetes mellitus without complication ([place])   . Ear infection   . Environmental allergies   . GERD (gastroesophageal reflux disease)   . Hx of rheumatic fever   . Hx: UTI (urinary tract infection)   .  Hyperlipidemia   . Hyperlipidemia   . Hypertension   . Hypothyroidism   . Kidney disease   . Lung cancer ([place])   . Mini stroke ([place])   . Stroke ([place])    x's 2  . Vertigo     Family History: Family History  Problem Relation Age of Onset  . Heart attack Father   . Heart disease Father     Social History   Socioeconomic History  . Marital status: Widowed    Spouse name: Not on file  . Number of children: Not on file  . Years of education: Not on file  . Highest education level: Not on file  Occupational History  . Not on file  Tobacco Use  . Smoking status: Former Smoker    Types:  Cigarettes  . Smokeless tobacco: Current User    Types: Chew  . Tobacco comment: Quit X30 years ago  Vaping Use  . Vaping Use: Never used  Substance and Sexual Activity  . Alcohol use: No  . Drug use: No  . Sexual activity: Not on file  Other Topics Concern  . Not on file  Social History Narrative  . Not on file   Social Determinants of Health   Financial Resource Strain:   . Difficulty of Paying Living Expenses:   Food Insecurity:   . Worried About Charity fundraiser in the Last Year:   . Arboriculturist in the Last Year:   Transportation Needs:   . Film/video editor (Medical):   Marland Kitchen Lack of Transportation (Non-Medical):   Physical Activity:   . Days of Exercise per Week:   . Minutes of Exercise per Session:   Stress:   . Feeling of Stress :   Social Connections:   . Frequency of Communication with Friends and Family:   . Frequency of Social Gatherings with Friends and Family:   . Attends Religious Services:   . Active Member of Clubs or Organizations:   . Attends Archivist Meetings:   Marland Kitchen Marital Status:   Intimate Partner Violence:   . Fear of Current or Ex-Partner:   . Emotionally Abused:   Marland Kitchen Physically Abused:   . Sexually Abused:       Review of Systems  Constitutional: Negative for activity change, chills, fatigue and unexpected weight change.  HENT: Negative for congestion, rhinorrhea, sneezing and sore throat.   Respiratory: Negative for cough, chest tightness, shortness of breath and wheezing.   Cardiovascular: Negative for chest pain and palpitations.       Blood pressure is mildly elevated today  Gastrointestinal: Negative for abdominal pain, constipation, diarrhea, nausea and vomiting.  Endocrine: Negative for cold intolerance, heat intolerance, polydipsia and polyuria.       Blood sugars stable.    Musculoskeletal: Negative for arthralgias, back pain, joint swelling and neck pain.  Allergic/Immunologic: Negative for environmental  allergies.  Neurological: Negative for dizziness, tremors, numbness and headaches.  Hematological: Negative for adenopathy. Does not bruise/bleed easily.  Psychiatric/Behavioral: Negative for behavioral problems and sleep disturbance. The patient is not nervous/anxious.     Today's Vitals   05/25/20 1502  BP: (!) 154/67  Pulse: 63  Resp: 16  Temp: 97.9 F (36.6 C)  SpO2: 95%  Weight: 133 lb 3.2 oz (60.4 kg)  Height: 5\' 2"  (1.575 m)   Body mass index is 24.36 kg/m.  Physical Exam Vitals and nursing note reviewed.  Constitutional:      Appearance: Normal appearance.  HENT:     Head: Normocephalic and atraumatic.     Nose: Nose normal.  Eyes:     Extraocular Movements: Extraocular movements intact.     Conjunctiva/sclera: Conjunctivae normal.     Pupils: Pupils are equal, round, and reactive to light.  Neck:     Vascular: No carotid bruit.  Cardiovascular:     Rate and Rhythm: Normal rate and regular rhythm.     Heart sounds: Normal heart sounds.  Pulmonary:     Effort: Pulmonary effort is normal.     Breath sounds: Normal breath sounds.  Abdominal:     Palpations: Abdomen is soft.  Musculoskeletal:     Cervical back: Normal range of motion and neck supple.  Skin:    General: Skin is warm.  Neurological:     Mental Status: She is alert and oriented to person, place, and time. Mental status is at baseline.  Psychiatric:        Mood and Affect: Mood normal.        Behavior: Behavior normal.        Thought Content: Thought content normal.        Judgment: Judgment normal.    Assessment/Plan: 1. Type 2 diabetes mellitus with hyperglycemia, with long-term current use of insulin (HCC) - POCT HgB A1C 6.7 today. She should continue all diabetic medication as prescribed   2. Essential hypertension, benign Stable. Continue bp medication as prescribed  3. Acquired hypothyroidism Continue levothyroxine as prescribed   4. Late onset Alzheimer's disease without  behavioral disturbance (HCC) Stable.   General Counseling: azlyn wingler understanding of the findings of todays visit and agrees with plan of treatment. I have discussed any further diagnostic evaluation that may be needed or ordered today. We also reviewed her medications today. she has been encouraged to call the office with any questions or concerns that should arise related to todays visit.  This patient was seen by Leretha Pol FNP Collaboration with Dr Lavera Guise as a part of collaborative care agreement  Orders Placed This Encounter  Procedures  . POCT HgB A1C     Total time spent: 30 Minutes   Time spent includes review of chart, medications, test results, and follow up plan with the patient.      Dr Lavera Guise Internal medicine

## 2020-07-14 ENCOUNTER — Other Ambulatory Visit: Payer: Self-pay

## 2020-07-14 MED ORDER — LEVOTHYROXINE SODIUM 75 MCG PO TABS: 75.0000 ug | ORAL_TABLET | Freq: Every day | ORAL | 0 refills | Status: DC

## 2020-07-14 MED ORDER — ALLOPURINOL 100 MG PO TABS: ORAL_TABLET | ORAL | 0 refills | Status: DC

## 2020-07-30 ENCOUNTER — Encounter: Payer: Self-pay | Admitting: Nurse Practitioner

## 2020-08-09 ENCOUNTER — Ambulatory Visit: Payer: Medicare Other | Admitting: Nurse Practitioner

## 2020-08-09 ENCOUNTER — Other Ambulatory Visit: Payer: Self-pay

## 2020-08-09 MED ORDER — BISOPROLOL-HYDROCHLOROTHIAZIDE 5-6.25 MG PO TABS: 1.0000 | ORAL_TABLET | Freq: Every day | ORAL | 0 refills | Status: DC

## 2020-08-10 ENCOUNTER — Encounter: Payer: Self-pay | Admitting: Hospice and Palliative Medicine

## 2020-08-10 ENCOUNTER — Other Ambulatory Visit: Payer: Self-pay

## 2020-08-10 ENCOUNTER — Ambulatory Visit (INDEPENDENT_AMBULATORY_CARE_PROVIDER_SITE_OTHER): Payer: Medicare Other | Admitting: Hospice and Palliative Medicine

## 2020-08-10 ENCOUNTER — Ambulatory Visit: Payer: Medicare Other | Admitting: Nurse Practitioner

## 2020-08-10 DIAGNOSIS — G301 Alzheimer's disease with late onset: Secondary | ICD-10-CM

## 2020-08-10 DIAGNOSIS — E1165 Type 2 diabetes mellitus with hyperglycemia: Secondary | ICD-10-CM

## 2020-08-10 DIAGNOSIS — I10 Essential (primary) hypertension: Secondary | ICD-10-CM

## 2020-08-10 DIAGNOSIS — Z0001 Encounter for general adult medical examination with abnormal findings: Secondary | ICD-10-CM

## 2020-08-10 DIAGNOSIS — R5383 Other fatigue: Secondary | ICD-10-CM

## 2020-08-10 DIAGNOSIS — F0280 Dementia in other diseases classified elsewhere without behavioral disturbance: Secondary | ICD-10-CM | POA: Diagnosis not present

## 2020-08-10 DIAGNOSIS — Z23 Encounter for immunization: Secondary | ICD-10-CM

## 2020-08-10 DIAGNOSIS — R300 Dysuria: Secondary | ICD-10-CM | POA: Diagnosis not present

## 2020-08-10 NOTE — Progress Notes (Signed)
[facility] [place], [place] [address]  Internal MEDICINE  Office Visit Note  Patient Name: Samantha Franco  [id number]  [id number]  Date of Service: 08/11/2020  Chief Complaint  Patient presents with  . Medicare Wellness    pt wants iron checked, pt feels bad for the last 3 or 4 days, pt has poor appetite, did not sleep well last night, restless, body feels achey and pt has no energy  . Diabetes  . Hyperlipidemia  . Hypertension     HPI Pt is here for routine health maintenance examination She has not been feeling well the last couple of days, she has not been eating well which is not abnormal for her, she does not have any specific complaints, says she would feel better if she had her hair done She has a 24 hour caregiver, over the last few months she has gotten her days and nights mixed up not sleeping well at night some nights and will sleep most of the day She is taking all of her medications as prescribed She has no issues with swallowing, ambulating independently Having a few issues with constipation, will go a few days without having a bowel movement and when she does the stool is hard, no blood in stool No longer having mammograms or colonoscopies Blood sugars are well managed per her caregiver reports  Her caregiver feels that her dementia is slowly progressing   Current Medication: Outpatient Encounter Medications as of 08/10/2020  Medication Sig  . allopurinol (ZYLOPRIM) 100 MG tablet TAKE 1 & 1/2 (ONE & ONE-HALF) TABLETS BY MOUTH NIGHTLY FOR  GOUT  . ALPRAZolam (XANAX) 0.25 MG tablet Take 1 tablet (0.25 mg total) by mouth at bedtime as needed for anxiety.  Marland Kitchen amLODipine (NORVASC) 5 MG tablet Take 1 tablet (5 mg total) by mouth daily.  Marland Kitchen aspirin 325 MG tablet Take 325 mg by mouth daily.  . bisoprolol-hydrochlorothiazide (ZIAC) 5-6.25 MG tablet Take 1 tablet by mouth daily.  . Continuous Blood Gluc Sensor MISC Use as directed every 14  days. May dispense FreeStyle [employer] or similar. diag E11.65  . docusate sodium (COLACE) 100 MG capsule Take 100 mg by mouth daily.  Marland Kitchen glucose blood (ACCU-CHEK AVIVA PLUS) test strip USE 1 STRIP TO CHECK GLUCOSE TWICE DAILY diag E11.65  . LEVEMIR FLEXTOUCH 100 UNIT/ML FlexPen INJECT 12 UNITS SUBCUTANEOUSLY ONCE DAILY  . levothyroxine (EUTHYROX) 75 MCG tablet Take 1 tablet (75 mcg total) by mouth daily before breakfast.  . memantine (NAMENDA) 5 MG tablet Take 2 tablets (10 mg total) by mouth 2 (two) times daily.  . QUEtiapine (SEROQUEL) 50 MG tablet Take 1 tablet (50 mg total) by mouth at bedtime.  . terbinafine (LAMISIL) 1 % cream Apply 1 application topically 2 (two) times daily.  . traMADol (ULTRAM) 50 MG tablet Take 1 tablet (50 mg total) by mouth every 8 (eight) hours as needed for moderate pain.  Marland Kitchen triamcinolone (KENALOG) 0.025 % cream Apply 1 application topically 2 (two) times daily.  . famotidine (PEPCID) 40 MG tablet Take 1 tablet (40 mg total) by mouth daily.  . [DISCONTINUED] acetaminophen (TYLENOL) 325 MG tablet Take by mouth. (Patient not taking: Reported on 08/10/2020)  . [DISCONTINUED] amoxicillin (AMOXIL) 875 MG tablet Take 1 tablet (875 mg total) by mouth 2 (two) times daily. (Patient not taking: Reported on 08/10/2020)  . [DISCONTINUED] buPROPion (WELLBUTRIN XL) 300 MG 24 hr tablet Take one tab daily for depression (Patient not taking: Reported  on 08/10/2020)  . [DISCONTINUED] chlorhexidine (PERIDEX) 0.12 % solution Use as directed 15 mLs in the mouth or throat 2 (two) times daily. (Patient not taking: Reported on 08/10/2020)  . [DISCONTINUED] escitalopram (LEXAPRO) 10 MG tablet Take 1 tablet (10 mg total) by mouth daily. (Patient not taking: Reported on 08/10/2020)   No facility-administered encounter medications on file as of 08/10/2020.    Surgical History: Past Surgical History:  Procedure Laterality Date  . CARDIAC CATHETERIZATION     [facility]  . CATARACT EXTRACTION     . LUNG LOBECTOMY    . THYROID SURGERY      Medical History: Past Medical History:  Diagnosis Date  . Arthritis   . Chorea   . COPD (chronic obstructive pulmonary disease) ([place])   . Diabetes mellitus without complication ([place])   . Ear infection   . Environmental allergies   . GERD (gastroesophageal reflux disease)   . Hx of rheumatic fever   . Hx: UTI (urinary tract infection)   . Hyperlipidemia   . Hyperlipidemia   . Hypertension   . Hypothyroidism   . Kidney disease   . Lung cancer ([place])   . Mini stroke ([place])   . Stroke ([place])    x's 2  . Vertigo     Family History: Family History  Problem Relation Age of Onset  . Heart attack Father   . Heart disease Father    Review of Systems  Constitutional: Positive for fatigue. Negative for chills and diaphoresis.  HENT: Negative for ear pain, postnasal drip and sinus pressure.   Eyes: Negative for photophobia, discharge, redness, itching and visual disturbance.  Respiratory: Negative for cough, shortness of breath and wheezing.   Cardiovascular: Negative for chest pain, palpitations and leg swelling.  Gastrointestinal: Positive for constipation. Negative for abdominal pain, diarrhea, nausea and vomiting.  Genitourinary: Negative for dysuria and flank pain.  Musculoskeletal: Negative for arthralgias, back pain, gait problem and neck pain.  Skin: Negative for color change.  Allergic/Immunologic: Negative for environmental allergies and food allergies.  Neurological: Negative for dizziness and headaches.  Hematological: Does not bruise/bleed easily.  Psychiatric/Behavioral: Positive for confusion and decreased concentration. Negative for agitation, behavioral problems (depression) and hallucinations.   Vital Signs: BP (!) 154/64   Pulse 80   Temp 97.8 F (36.6 C)   Resp 16   Ht 5\' 2"  (1.575 m)   Wt 134 lb 12.8 oz (61.1 kg)   SpO2 96%   BMI 24.66 kg/m    Physical Exam Vitals reviewed.  Constitutional:       Appearance: Normal appearance.  HENT:     Mouth/Throat:     Mouth: Mucous membranes are moist.     Pharynx: Oropharynx is clear.  Cardiovascular:     Rate and Rhythm: Normal rate and regular rhythm.     Pulses: Normal pulses.     Heart sounds: Normal heart sounds.  Pulmonary:     Effort: Pulmonary effort is normal.     Breath sounds: Normal breath sounds.  Abdominal:     General: Abdomen is flat.     Palpations: Abdomen is soft.  Musculoskeletal:        General: Normal range of motion.     Cervical back: Normal range of motion.  Skin:    General: Skin is warm.  Neurological:     Mental Status: She is alert. Mental status is at baseline.  Psychiatric:        Mood and Affect: Mood normal.  Behavior: Behavior normal.        Thought Content: Thought content normal.    LABS: Recent Results (from the past 2160 hour(s))  POCT HgB A1C     Status: Abnormal   Collection Time: 05/25/20  3:33 PM  Result Value Ref Range   Hemoglobin A1C 6.7 (A) 4.0 - 5.6 %   HbA1c POC (<> result, manual entry)     HbA1c, POC (prediabetic range)     HbA1c, POC (controlled diabetic range)    UA/M w/rflx Culture, Routine     Status: Abnormal   Collection Time: 08/10/20  4:44 PM   Specimen: Urine   Urine  Result Value Ref Range   Specific Gravity, UA 1.023 1.005 - 1.030   pH, UA 5.0 5.0 - 7.5   Color, UA Yellow Yellow   Appearance Ur Cloudy (A) Clear   Leukocytes,UA Negative Negative   Protein,UA Negative Negative/Trace   Glucose, UA Negative Negative   Ketones, UA Trace (A) Negative   RBC, UA Negative Negative   Bilirubin, UA Negative Negative   Urobilinogen, Ur 1.0 0.2 - 1.0 mg/dL   Nitrite, UA Negative Negative   Microscopic Examination Comment     Comment: Microscopic follows if indicated.   Microscopic Examination See below:     Comment: Microscopic was indicated and was performed.   Urinalysis Reflex Comment     Comment: This specimen will not reflex to a Urine Culture.   Microscopic Examination     Status: Abnormal   Collection Time: 08/10/20  4:44 PM   Urine  Result Value Ref Range   WBC, UA 0-5 0 - 5 /hpf   RBC 0-2 0 - 2 /hpf   Epithelial Cells (non renal) >10 (A) 0 - 10 /hpf   Casts Present (A) None seen /lpf   Cast Type Hyaline casts N/A   Bacteria, UA Few None seen/Few    MMSE - Mini Mental State Exam 08/10/2020 08/09/2019 06/30/2018  Orientation to time 0 5 5  Orientation to Place 0 5 5  Registration 3 3 3   Attention/ Calculation 5 5 5   Recall 3 3 3   Language- name 2 objects 2 2 2   Language- repeat 1 1 1   Language- follow 3 step command 0 3 3  Language- read & follow direction 1 1 1   Write a sentence 0 1 1  Copy design 1 1 1   Total score 16 30 30       Depression screen [facility] 2/9 08/10/2020 05/25/2020 02/07/2020 11/08/2019 08/09/2019  Decreased Interest 0 0 0 0 0  Down, Depressed, Hopeless 0 0 0 0 0  PHQ - 2 Score 0 0 0 0 0     Fall Risk  08/10/2020 05/25/2020 02/07/2020 11/08/2019 08/09/2019  Falls in the past year? 0 0 0 0 0  Number falls in past yr: - - - - -  Injury with Fall? - - - - -    Assessment/Plan: 1. Encounter for routine adult health examination with abnormal findings Well appearing 86 year old female, will review labs. Up to date on appropriate health measures for her age. Appears to be at her baseline mentally, she has trouble focusing as well as recalling information. Significant decline in MME. - CBC w/Diff/Platelet - Comprehensive Metabolic Panel (CMET) - Lipid Panel w/o Chol/HDL Ratio - TSH + free T4 - B12  2. Essential hypertension, benign BP and HR stable today on current therapy, will continue with routine monitoring.  3. Type 2 diabetes mellitus with  hyperglycemia, without long-term current use of insulin (HCC) Stable at this time per caregiver reports of home glucose monitoring. Next A1C due to be checked at next visit.  4. Late onset Alzheimer's disease without behavioral disturbance ([place]) Decline in MME,  explained to caregiver that dementia/AD is a progressive disease. May be appropriate to speak with the family about goals of care.  5. Fatigue, unspecified type Will check lab values of iron. Discussed with caregiver this is most likely related to her dementia as well as having her days and nights mixed up.  6. Dysuria - UA/M w/rflx Culture, Routine - Microscopic Examination  7. Flu vaccine need - Flu Vaccine MDCK QUAD PF  General Counseling: Sheri verbalizes understanding of the findings of todays visit and agrees with plan of treatment. I have discussed any further diagnostic evaluation that may be needed or ordered today. We also reviewed her medications today. she has been encouraged to call the office with any questions or concerns that should arise related to todays visit.    Counseling:    Orders Placed This Encounter  Procedures  . Microscopic Examination  . Flu Vaccine MDCK QUAD PF  . CBC w/Diff/Platelet  . Comprehensive Metabolic Panel (CMET)  . Lipid Panel w/o Chol/HDL Ratio  . TSH + free T4  . B12  . UA/M w/rflx Culture, Routine      Total time spent: 35 Minutes  Time spent includes review of chart, medications, test results, and follow up plan with the patient.   This patient was seen by Casey Burkitt AGNP-C Collaboration with Dr Lavera Guise as a part of collaborative care agreement   Tanna Furry. [facility] Internal Medicine

## 2020-08-11 ENCOUNTER — Encounter: Payer: Self-pay | Admitting: Hospice and Palliative Medicine

## 2020-08-11 LAB — UA/M W/RFLX CULTURE, ROUTINE
Bilirubin, UA: NEGATIVE
Glucose, UA: NEGATIVE
Leukocytes,UA: NEGATIVE
Nitrite, UA: NEGATIVE
Protein,UA: NEGATIVE
RBC, UA: NEGATIVE
Specific Gravity, UA: 1.023 (ref 1.005–1.030)
Urobilinogen, Ur: 1 mg/dL (ref 0.2–1.0)
pH, UA: 5 (ref 5.0–7.5)

## 2020-08-11 LAB — MICROSCOPIC EXAMINATION: Epithelial Cells (non renal): >10 /hpf — AB (ref 0–10)

## 2020-08-17 DIAGNOSIS — F0280 Dementia in other diseases classified elsewhere without behavioral disturbance: Secondary | ICD-10-CM | POA: Diagnosis not present

## 2020-08-17 DIAGNOSIS — G301 Alzheimer's disease with late onset: Secondary | ICD-10-CM | POA: Diagnosis not present

## 2020-08-17 DIAGNOSIS — R5383 Other fatigue: Secondary | ICD-10-CM | POA: Diagnosis not present

## 2020-08-17 DIAGNOSIS — Z0001 Encounter for general adult medical examination with abnormal findings: Secondary | ICD-10-CM | POA: Diagnosis not present

## 2020-08-18 ENCOUNTER — Other Ambulatory Visit: Payer: Self-pay | Admitting: Hospice and Palliative Medicine

## 2020-08-18 DIAGNOSIS — N289 Disorder of kidney and ureter, unspecified: Secondary | ICD-10-CM

## 2020-08-18 LAB — LIPID PANEL W/O CHOL/HDL RATIO
Cholesterol, Total: 174 mg/dL (ref 100–199)
HDL: 42 mg/dL (ref >39)
LDL Chol Calc (NIH): 112 mg/dL — ABNORMAL HIGH (ref 0–99)
Triglycerides: 107 mg/dL (ref 0–149)
VLDL Cholesterol Cal: 20 mg/dL (ref 5–40)

## 2020-08-18 LAB — CBC WITH DIFFERENTIAL/PLATELET
Basophils Absolute: 0.1 10*3/uL (ref 0.0–0.2)
Basos: 1 %
EOS (ABSOLUTE): 0.4 10*3/uL (ref 0.0–0.4)
Eos: 3 %
Hematocrit: 36.8 % (ref 34.0–46.6)
Hemoglobin: 11.7 g/dL (ref 11.1–15.9)
Immature Grans (Abs): 0.1 10*3/uL (ref 0.0–0.1)
Immature Granulocytes: 1 %
Lymphocytes Absolute: 1.7 10*3/uL (ref 0.7–3.1)
Lymphs: 15 %
MCH: 26.2 pg — ABNORMAL LOW (ref 26.6–33.0)
MCHC: 31.8 g/dL (ref 31.5–35.7)
MCV: 83 fL (ref 79–97)
Monocytes Absolute: 0.9 10*3/uL (ref 0.1–0.9)
Monocytes: 8 %
Neutrophils Absolute: 8.2 10*3/uL — ABNORMAL HIGH (ref 1.4–7.0)
Neutrophils: 72 %
Platelets: 241 10*3/uL (ref 150–450)
RBC: 4.46 x10E6/uL (ref 3.77–5.28)
RDW: 15.8 % — ABNORMAL HIGH (ref 11.7–15.4)
WBC: 11.3 10*3/uL — ABNORMAL HIGH (ref 3.4–10.8)

## 2020-08-18 LAB — COMPREHENSIVE METABOLIC PANEL
ALT: 15 IU/L (ref 0–32)
AST: 17 IU/L (ref 0–40)
Albumin/Globulin Ratio: 1.7 (ref 1.2–2.2)
Albumin: 4.4 g/dL (ref 3.6–4.6)
Alkaline Phosphatase: 94 IU/L (ref 44–121)
BUN/Creatinine Ratio: 16 (ref 12–28)
BUN: 22 mg/dL (ref 8–27)
Bilirubin Total: 0.3 mg/dL (ref 0.0–1.2)
CO2: 24 mmol/L (ref 20–29)
Calcium: 9.6 mg/dL (ref 8.7–10.3)
Chloride: 103 mmol/L (ref 96–106)
Creatinine, Ser: 1.39 mg/dL — ABNORMAL HIGH (ref 0.57–1.00)
GFR calc Af Amer: 39 mL/min/{1.73_m2} — ABNORMAL LOW (ref >59)
GFR calc non Af Amer: 34 mL/min/{1.73_m2} — ABNORMAL LOW (ref >59)
Globulin, Total: 2.6 g/dL (ref 1.5–4.5)
Glucose: 145 mg/dL — ABNORMAL HIGH (ref 65–99)
Potassium: 3.9 mmol/L (ref 3.5–5.2)
Sodium: 143 mmol/L (ref 134–144)
Total Protein: 7 g/dL (ref 6.0–8.5)

## 2020-08-18 LAB — TSH+FREE T4
Free T4: 1.33 ng/dL (ref 0.82–1.77)
TSH: 0.578 u[IU]/mL (ref 0.450–4.500)

## 2020-08-18 LAB — VITAMIN B12: Vitamin B-12: 326 pg/mL (ref 232–1245)

## 2020-08-18 NOTE — Progress Notes (Signed)
Please call and discuss with caregiver or patients daughter that her lab work she recently had shows a pretty significant decline in her kidney function, as well as elevated glucose levels (was she fasting?)  Due to her progressive dementia--how aggressive do they want to be about her kidney function? Options are we can repeat labs in one month or we can do an [place] of her kidney's?

## 2020-08-21 ENCOUNTER — Other Ambulatory Visit: Payer: Self-pay

## 2020-09-11 ENCOUNTER — Other Ambulatory Visit: Payer: Self-pay

## 2020-09-11 DIAGNOSIS — I10 Essential (primary) hypertension: Secondary | ICD-10-CM

## 2020-09-11 MED ORDER — AMLODIPINE BESYLATE 5 MG PO TABS: 5.0000 mg | ORAL_TABLET | Freq: Every day | ORAL | 1 refills | Status: DC

## 2020-09-11 MED ORDER — ALLOPURINOL 100 MG PO TABS: ORAL_TABLET | ORAL | 0 refills | Status: DC

## 2020-09-19 ENCOUNTER — Other Ambulatory Visit: Payer: Self-pay

## 2020-09-19 DIAGNOSIS — F0280 Dementia in other diseases classified elsewhere without behavioral disturbance: Secondary | ICD-10-CM

## 2020-09-19 DIAGNOSIS — G301 Alzheimer's disease with late onset: Secondary | ICD-10-CM

## 2020-09-19 MED ORDER — MEMANTINE HCL 5 MG PO TABS: 10.0000 mg | ORAL_TABLET | Freq: Two times a day (BID) | ORAL | 1 refills | Status: DC

## 2020-10-04 ENCOUNTER — Ambulatory Visit (INDEPENDENT_AMBULATORY_CARE_PROVIDER_SITE_OTHER): Payer: Medicare Other | Admitting: Hospice and Palliative Medicine

## 2020-10-04 ENCOUNTER — Encounter: Payer: Self-pay | Admitting: Hospice and Palliative Medicine

## 2020-10-04 DIAGNOSIS — N289 Disorder of kidney and ureter, unspecified: Secondary | ICD-10-CM | POA: Diagnosis not present

## 2020-10-04 DIAGNOSIS — E1165 Type 2 diabetes mellitus with hyperglycemia: Secondary | ICD-10-CM | POA: Diagnosis not present

## 2020-10-04 DIAGNOSIS — I10 Essential (primary) hypertension: Secondary | ICD-10-CM

## 2020-10-04 DIAGNOSIS — G301 Alzheimer's disease with late onset: Secondary | ICD-10-CM

## 2020-10-04 DIAGNOSIS — F0280 Dementia in other diseases classified elsewhere without behavioral disturbance: Secondary | ICD-10-CM | POA: Diagnosis not present

## 2020-10-04 LAB — POCT GLYCOSYLATED HEMOGLOBIN (HGB A1C): Hemoglobin A1C: 7.2 % — AB (ref 4.0–5.6)

## 2020-10-04 NOTE — Progress Notes (Signed)
[facility] [place], [place] [address]  Internal MEDICINE  Office Visit Note  Patient Name: Samantha Franco  [id number]  [id number]  Date of Service: 10/10/2020  Chief Complaint  Patient presents with  . Follow-up    2 month  . Quality Metric Gaps    eye exam, covid vacc, foot exam  . controlled substance policy    scanned     HPI Patient is here for routine follow-up Caregiver in present in the room today Overall, she has been doing well Continues to have a good appetite, sleeping better at night, no issues with constipation at this time, caregiver reports her memory is about the same Fatigue seems to have improved now that she is sleeping better at night, feels she has more energy Reviewed her recent labs-kidney functioning slightly worsening, elevated fasting glucose We discussed in detail her kidney function, has been declining for some time, discussed possible renal US--she is not interested in further testing at this time  Current Medication: Outpatient Encounter Medications as of 10/04/2020  Medication Sig  . allopurinol (ZYLOPRIM) 100 MG tablet TAKE 1 & 1/2 (ONE & ONE-HALF) TABLETS BY MOUTH NIGHTLY FOR  GOUT  . ALPRAZolam (XANAX) 0.25 MG tablet Take 1 tablet (0.25 mg total) by mouth at bedtime as needed for anxiety.  Marland Kitchen amLODipine (NORVASC) 5 MG tablet Take 1 tablet (5 mg total) by mouth daily.  Marland Kitchen aspirin 325 MG tablet Take 325 mg by mouth daily.  . bisoprolol-hydrochlorothiazide (ZIAC) 5-6.25 MG tablet Take 1 tablet by mouth daily.  . Continuous Blood Gluc Sensor MISC Use as directed every 14 days. May dispense FreeStyle [employer] or similar. diag E11.65  . docusate sodium (COLACE) 100 MG capsule Take 100 mg by mouth daily.  Marland Kitchen glucose blood (ACCU-CHEK AVIVA PLUS) test strip USE 1 STRIP TO CHECK GLUCOSE TWICE DAILY diag E11.65  . LEVEMIR FLEXTOUCH 100 UNIT/ML FlexPen INJECT 12 UNITS SUBCUTANEOUSLY ONCE DAILY  . memantine (NAMENDA) 5  MG tablet Take 2 tablets (10 mg total) by mouth 2 (two) times daily.  . QUEtiapine (SEROQUEL) 50 MG tablet Take 1 tablet (50 mg total) by mouth at bedtime.  . terbinafine (LAMISIL) 1 % cream Apply 1 application topically 2 (two) times daily.  . traMADol (ULTRAM) 50 MG tablet Take 1 tablet (50 mg total) by mouth every 8 (eight) hours as needed for moderate pain.  Marland Kitchen triamcinolone (KENALOG) 0.025 % cream Apply 1 application topically 2 (two) times daily.  . [DISCONTINUED] levothyroxine (EUTHYROX) 75 MCG tablet Take 1 tablet (75 mcg total) by mouth daily before breakfast.  . famotidine (PEPCID) 40 MG tablet Take 1 tablet (40 mg total) by mouth daily.   No facility-administered encounter medications on file as of 10/04/2020.    Surgical History: Past Surgical History:  Procedure Laterality Date  . CARDIAC CATHETERIZATION     [facility]  . CATARACT EXTRACTION    . LUNG LOBECTOMY    . THYROID SURGERY      Medical History: Past Medical History:  Diagnosis Date  . Arthritis   . Chorea   . COPD (chronic obstructive pulmonary disease) ([place])   . Diabetes mellitus without complication ([place])   . Ear infection   . Environmental allergies   . GERD (gastroesophageal reflux disease)   . Hx of rheumatic fever   . Hx: UTI (urinary tract infection)   . Hyperlipidemia   . Hyperlipidemia   . Hypertension   . Hypothyroidism   . Kidney  disease   . Lung cancer ([place])   . Mini stroke ([place])   . Stroke ([place])    x's 2  . Vertigo     Family History: Family History  Problem Relation Age of Onset  . Heart attack Father   . Heart disease Father     Social History   Socioeconomic History  . Marital status: Widowed    Spouse name: Not on file  . Number of children: Not on file  . Years of education: Not on file  . Highest education level: Not on file  Occupational History  . Not on file  Tobacco Use  . Smoking status: Former Smoker    Types: Cigarettes  . Smokeless tobacco: Former Systems developer     Types: Chew    Quit date: 06/05/2020  . Tobacco comment: Quit X30 years ago  Vaping Use  . Vaping Use: Never used  Substance and Sexual Activity  . Alcohol use: No  . Drug use: No  . Sexual activity: Not on file  Other Topics Concern  . Not on file  Social History Narrative  . Not on file   Social Determinants of Health   Financial Resource Strain:   . Difficulty of Paying Living Expenses: Not on file  Food Insecurity:   . Worried About Charity fundraiser in the Last Year: Not on file  . Ran Out of Food in the Last Year: Not on file  Transportation Needs:   . Lack of Transportation (Medical): Not on file  . Lack of Transportation (Non-Medical): Not on file  Physical Activity:   . Days of Exercise per Week: Not on file  . Minutes of Exercise per Session: Not on file  Stress:   . Feeling of Stress : Not on file  Social Connections:   . Frequency of Communication with Friends and Family: Not on file  . Frequency of Social Gatherings with Friends and Family: Not on file  . Attends Religious Services: Not on file  . Active Member of Clubs or Organizations: Not on file  . Attends Archivist Meetings: Not on file  . Marital Status: Not on file  Intimate Partner Violence:   . Fear of Current or Ex-Partner: Not on file  . Emotionally Abused: Not on file  . Physically Abused: Not on file  . Sexually Abused: Not on file      Review of Systems  Constitutional: Negative for chills, diaphoresis and fatigue.  HENT: Negative for ear pain, postnasal drip and sinus pressure.   Eyes: Negative for photophobia, discharge, redness, itching and visual disturbance.  Respiratory: Negative for cough, shortness of breath and wheezing.   Cardiovascular: Negative for chest pain, palpitations and leg swelling.  Gastrointestinal: Negative for abdominal pain, constipation, diarrhea, nausea and vomiting.  Genitourinary: Negative for dysuria and flank pain.  Musculoskeletal: Negative for  arthralgias, back pain, gait problem and neck pain.  Skin: Negative for color change.  Allergic/Immunologic: Negative for environmental allergies and food allergies.  Neurological: Negative for dizziness and headaches.  Hematological: Does not bruise/bleed easily.  Psychiatric/Behavioral: Negative for agitation, behavioral problems (depression) and hallucinations.    Vital Signs: BP 138/60   Pulse 65   Temp 97.9 F (36.6 C)   Resp 16   Ht 5\' 2"  (1.575 m)   Wt 137 lb (62.1 kg)   SpO2 95%   BMI 25.06 kg/m    Physical Exam Vitals reviewed.  Constitutional:      Appearance: Normal appearance.  She is normal weight.  Cardiovascular:     Rate and Rhythm: Normal rate and regular rhythm.     Pulses: Normal pulses.     Heart sounds: Normal heart sounds.  Pulmonary:     Effort: Pulmonary effort is normal.     Breath sounds: Normal breath sounds.  Abdominal:     General: Abdomen is flat.     Palpations: Abdomen is soft.  Musculoskeletal:        General: Normal range of motion.     Cervical back: Normal range of motion.  Skin:    General: Skin is warm.  Neurological:     General: No focal deficit present.     Mental Status: She is alert and oriented to person, place, and time. Mental status is at baseline.  Psychiatric:        Mood and Affect: Mood normal.        Behavior: Behavior normal.        Thought Content: Thought content normal.    Assessment/Plan: 1. Type 2 diabetes mellitus with hyperglycemia, without long-term current use of insulin (HCC) A1C 7.2 today, stable, due to increased age goal A1C is 7 to avoid hypoglycemic events Continue with current therapy at this time and routine monitoring - POCT glycosylated hemoglobin (Hb A1C)  2. Late onset Alzheimer's disease without behavioral disturbance ([place]) Memory appears stable at this time, encouraged to continue working on sleep hygiene, continue with Namenda  3. Essential hypertension, benign BP and HR well  controlled today, continue with routine monitoring  4. Abnormal kidney function Slight worsening in kidney function, does not want to pursuit further testing at this time, will repeat levels prior to next visit--may need follow-up renal US  General Counseling: Clairissa verbalizes understanding of the findings of todays visit and agrees with plan of treatment. I have discussed any further diagnostic evaluation that may be needed or ordered today. We also reviewed her medications today. she has been encouraged to call the office with any questions or concerns that should arise related to todays visit.    Orders Placed This Encounter  Procedures  . Comprehensive Metabolic Panel (CMET)  . POCT glycosylated hemoglobin (Hb A1C)      Time spent: 30 Minutes Time spent includes review of chart, medications, test results and follow-up plan with the patient.  This patient was seen by Theodoro Grist AGNP-C in Collaboration with Dr Lavera Guise as a part of collaborative care agreement     Tanna Furry. Murrel Bertram AGNP-C Internal medicine

## 2020-10-10 ENCOUNTER — Other Ambulatory Visit: Payer: Self-pay

## 2020-10-10 ENCOUNTER — Encounter: Payer: Self-pay | Admitting: Hospice and Palliative Medicine

## 2020-10-10 MED ORDER — LEVOTHYROXINE SODIUM 75 MCG PO TABS: 75.0000 ug | ORAL_TABLET | Freq: Every day | ORAL | 0 refills | Status: DC

## 2020-10-15 ENCOUNTER — Encounter: Payer: Self-pay | Admitting: Internal Medicine

## 2020-11-20 ENCOUNTER — Other Ambulatory Visit: Payer: Self-pay

## 2020-11-20 DIAGNOSIS — F411 Generalized anxiety disorder: Secondary | ICD-10-CM

## 2020-11-20 MED ORDER — QUETIAPINE FUMARATE 50 MG PO TABS: 50.0000 mg | ORAL_TABLET | Freq: Every day | ORAL | 1 refills | Status: DC

## 2020-11-30 ENCOUNTER — Other Ambulatory Visit: Payer: Self-pay

## 2020-11-30 MED ORDER — BISOPROLOL-HYDROCHLOROTHIAZIDE 5-6.25 MG PO TABS: 1.0000 | ORAL_TABLET | Freq: Every day | ORAL | 0 refills | Status: DC

## 2020-12-06 ENCOUNTER — Other Ambulatory Visit: Payer: Self-pay | Admitting: Nurse Practitioner

## 2020-12-06 ENCOUNTER — Encounter: Payer: Self-pay | Admitting: Internal Medicine

## 2020-12-06 ENCOUNTER — Other Ambulatory Visit: Payer: Self-pay

## 2020-12-06 ENCOUNTER — Encounter: Payer: Self-pay | Admitting: Nurse Practitioner

## 2020-12-06 DIAGNOSIS — F0280 Dementia in other diseases classified elsewhere without behavioral disturbance: Secondary | ICD-10-CM

## 2020-12-06 DIAGNOSIS — G301 Alzheimer's disease with late onset: Secondary | ICD-10-CM

## 2020-12-06 DIAGNOSIS — I10 Essential (primary) hypertension: Secondary | ICD-10-CM

## 2020-12-06 MED ORDER — ALLOPURINOL 100 MG PO TABS: ORAL_TABLET | ORAL | 0 refills | Status: DC

## 2020-12-06 MED ORDER — AMLODIPINE BESYLATE 5 MG PO TABS: 5.0000 mg | ORAL_TABLET | Freq: Every day | ORAL | 1 refills | Status: DC

## 2020-12-06 MED ORDER — MEMANTINE HCL 5 MG PO TABS: 10.0000 mg | ORAL_TABLET | Freq: Two times a day (BID) | ORAL | 1 refills | Status: DC

## 2020-12-06 MED ORDER — LEVOTHYROXINE SODIUM 75 MCG PO TABS: 75.0000 ug | ORAL_TABLET | Freq: Every day | ORAL | 0 refills | Status: DC

## 2020-12-26 DIAGNOSIS — E11649 Type 2 diabetes mellitus with hypoglycemia without coma: Secondary | ICD-10-CM | POA: Diagnosis not present

## 2020-12-26 DIAGNOSIS — E1165 Type 2 diabetes mellitus with hyperglycemia: Secondary | ICD-10-CM | POA: Diagnosis not present

## 2021-01-03 ENCOUNTER — Ambulatory Visit: Payer: Medicare Other | Admitting: Hospice and Palliative Medicine

## 2021-01-08 DIAGNOSIS — E1165 Type 2 diabetes mellitus with hyperglycemia: Secondary | ICD-10-CM | POA: Diagnosis not present

## 2021-01-08 DIAGNOSIS — E11649 Type 2 diabetes mellitus with hypoglycemia without coma: Secondary | ICD-10-CM | POA: Diagnosis not present

## 2021-01-21 ENCOUNTER — Other Ambulatory Visit: Payer: Self-pay | Admitting: Nurse Practitioner

## 2021-01-21 DIAGNOSIS — I10 Essential (primary) hypertension: Secondary | ICD-10-CM

## 2021-01-22 ENCOUNTER — Telehealth: Payer: Self-pay

## 2021-01-22 NOTE — Telephone Encounter (Signed)
lmom to rs missed ov from 01-03-21.

## 2021-01-23 ENCOUNTER — Telehealth: Payer: Self-pay

## 2021-01-23 NOTE — Telephone Encounter (Signed)
Spoke with pt son he is not to do  any genetic testing due to paperwork received from [place] genetic councelling department denied and send paper back that pt is not interested

## 2021-01-25 ENCOUNTER — Ambulatory Visit: Payer: Medicare Other | Admitting: Hospice and Palliative Medicine

## 2021-01-31 ENCOUNTER — Ambulatory Visit (INDEPENDENT_AMBULATORY_CARE_PROVIDER_SITE_OTHER): Payer: Medicare Other | Admitting: Hospice and Palliative Medicine

## 2021-01-31 ENCOUNTER — Other Ambulatory Visit: Payer: Self-pay

## 2021-01-31 ENCOUNTER — Encounter: Payer: Self-pay | Admitting: Hospice and Palliative Medicine

## 2021-01-31 VITALS — BP 124/52 | HR 66 | Temp 97.6°F | Resp 16 | Ht 62.0 in | Wt 136.0 lb

## 2021-01-31 DIAGNOSIS — Z8739 Personal history of other diseases of the musculoskeletal system and connective tissue: Secondary | ICD-10-CM | POA: Diagnosis not present

## 2021-01-31 DIAGNOSIS — F02818 Dementia in other diseases classified elsewhere, unspecified severity, with other behavioral disturbance: Secondary | ICD-10-CM

## 2021-01-31 DIAGNOSIS — F0281 Dementia in other diseases classified elsewhere with behavioral disturbance: Secondary | ICD-10-CM

## 2021-01-31 DIAGNOSIS — Z79899 Other long term (current) drug therapy: Secondary | ICD-10-CM

## 2021-01-31 DIAGNOSIS — E039 Hypothyroidism, unspecified: Secondary | ICD-10-CM

## 2021-01-31 DIAGNOSIS — G301 Alzheimer's disease with late onset: Secondary | ICD-10-CM | POA: Diagnosis not present

## 2021-01-31 DIAGNOSIS — F411 Generalized anxiety disorder: Secondary | ICD-10-CM

## 2021-01-31 DIAGNOSIS — E1165 Type 2 diabetes mellitus with hyperglycemia: Secondary | ICD-10-CM | POA: Diagnosis not present

## 2021-01-31 DIAGNOSIS — I10 Essential (primary) hypertension: Secondary | ICD-10-CM

## 2021-01-31 LAB — POCT GLYCOSYLATED HEMOGLOBIN (HGB A1C): Hemoglobin A1C: 7 % — AB (ref 4.0–5.6)

## 2021-01-31 LAB — POCT URINE DRUG SCREEN
Methylenedioxyamphetamine: NOT DETECTED
POC Amphetamine UR: NOT DETECTED
POC BENZODIAZEPINES UR: NOT DETECTED
POC Barbiturate UR: NOT DETECTED
POC Cocaine UR: NOT DETECTED
POC Ecstasy UR: NOT DETECTED
POC Marijuana UR: NOT DETECTED
POC Methadone UR: NOT DETECTED
POC Methamphetamine UR: NOT DETECTED
POC Opiate Ur: NOT DETECTED
POC Oxycodone UR: NOT DETECTED
POC PHENCYCLIDINE UR: NOT DETECTED
POC TRICYCLICS UR: NOT DETECTED

## 2021-01-31 MED ORDER — QUETIAPINE FUMARATE 50 MG PO TABS: 50.0000 mg | ORAL_TABLET | Freq: Every day | ORAL | 1 refills | Status: DC

## 2021-01-31 MED ORDER — ALPRAZOLAM 0.25 MG PO TABS: 0.2500 mg | ORAL_TABLET | Freq: Every evening | ORAL | 2 refills | Status: DC | PRN

## 2021-01-31 MED ORDER — ALPRAZOLAM 0.25 MG PO TABS: 0.2500 mg | ORAL_TABLET | Freq: Every evening | ORAL | 1 refills | Status: DC | PRN

## 2021-01-31 MED ORDER — LEVOTHYROXINE SODIUM 75 MCG PO TABS: 75.0000 ug | ORAL_TABLET | Freq: Every day | ORAL | 0 refills | Status: DC

## 2021-01-31 MED ORDER — ALLOPURINOL 100 MG PO TABS: ORAL_TABLET | ORAL | 0 refills | Status: DC

## 2021-01-31 NOTE — Progress Notes (Signed)
[facility] [place], [place] [address]  Internal MEDICINE  Office Visit Note  Patient Name: Samantha Franco  [id number]  [id number]  Date of Service: 01/31/2021  Chief Complaint  Patient presents with  . Follow-up    Refill request  . Diabetes  . Gastroesophageal Reflux  . Hyperlipidemia  . Hypertension  . COPD  . Quality Metric Gaps    Foot exam, eye exam     HPI Patient is here for routine follow-up Accompanied by her caregiver today Caregiver is concerned about her sleep scheduled and is having more frequent irritable outbursts Currently taking Seroquel at bedtime which continues to help with her sleep but a few nights per week will wake up abut 3-4 hours after falling asleep, wakes up confused and fairly irritable and difficult to redirect Has been out of alprazolam for a few months  Continues to eat well, no changes in appetite, no issues on constipation Denies pain, no skin breakdown  Also concerned about low BP, has had a few low readings at home, will occasionally complain of dizziness with position changes  Current Medication: Outpatient Encounter Medications as of 01/31/2021  Medication Sig  . ALPRAZolam (XANAX) 0.25 MG tablet Take 1 tablet (0.25 mg total) by mouth at bedtime as needed for anxiety.  Marland Kitchen allopurinol (ZYLOPRIM) 100 MG tablet TAKE 1 & 1/2 (ONE & ONE-HALF) TABLETS BY MOUTH NIGHTLY FOR  GOUT  . aspirin 325 MG tablet Take 325 mg by mouth daily.  . bisoprolol-hydrochlorothiazide (ZIAC) 5-6.25 MG tablet Take 1 tablet by mouth daily.  . Continuous Blood Gluc Sensor MISC Use as directed every 14 days. May dispense FreeStyle [employer] or similar. diag E11.65  . docusate sodium (COLACE) 100 MG capsule Take 100 mg by mouth daily.  . famotidine (PEPCID) 40 MG tablet Take 1 tablet (40 mg total) by mouth daily.  Marland Kitchen glucose blood (ACCU-CHEK AVIVA PLUS) test strip USE 1 STRIP TO CHECK GLUCOSE TWICE DAILY diag E11.65  . LEVEMIR  FLEXTOUCH 100 UNIT/ML FlexPen INJECT 12 UNITS SUBCUTANEOUSLY ONCE DAILY  . levothyroxine (EUTHYROX) 75 MCG tablet Take 1 tablet (75 mcg total) by mouth daily before breakfast.  . memantine (NAMENDA) 5 MG tablet Take 2 tablets (10 mg total) by mouth 2 (two) times daily.  . QUEtiapine (SEROQUEL) 50 MG tablet Take 1 tablet (50 mg total) by mouth at bedtime.  . terbinafine (LAMISIL) 1 % cream Apply 1 application topically 2 (two) times daily.  . traMADol (ULTRAM) 50 MG tablet Take 1 tablet (50 mg total) by mouth every 8 (eight) hours as needed for moderate pain.  Marland Kitchen triamcinolone (KENALOG) 0.025 % cream Apply 1 application topically 2 (two) times daily.  . [DISCONTINUED] allopurinol (ZYLOPRIM) 100 MG tablet TAKE 1 & 1/2 (ONE & ONE-HALF) TABLETS BY MOUTH NIGHTLY FOR  GOUT  . [DISCONTINUED] ALPRAZolam (XANAX) 0.25 MG tablet Take 1 tablet (0.25 mg total) by mouth at bedtime as needed for anxiety.  . [DISCONTINUED] ALPRAZolam (XANAX) 0.25 MG tablet Take 1 tablet (0.25 mg total) by mouth at bedtime as needed for anxiety.  . [DISCONTINUED] amLODipine (NORVASC) 5 MG tablet Take 1 tablet (5 mg total) by mouth daily.  . [DISCONTINUED] levothyroxine (EUTHYROX) 75 MCG tablet Take 1 tablet (75 mcg total) by mouth daily before breakfast.  . [DISCONTINUED] QUEtiapine (SEROQUEL) 50 MG tablet Take 1 tablet (50 mg total) by mouth at bedtime.   No facility-administered encounter medications on file as of 01/31/2021.    Surgical History:  Past Surgical History:  Procedure Laterality Date  . CARDIAC CATHETERIZATION     [facility]  . CATARACT EXTRACTION    . LUNG LOBECTOMY    . THYROID SURGERY      Medical History: Past Medical History:  Diagnosis Date  . Arthritis   . Chorea   . COPD (chronic obstructive pulmonary disease) ([place])   . Diabetes mellitus without complication ([place])   . Ear infection   . Environmental allergies   . GERD (gastroesophageal reflux disease)   . Hx of rheumatic fever   . Hx: UTI (urinary  tract infection)   . Hyperlipidemia   . Hyperlipidemia   . Hypertension   . Hypothyroidism   . Kidney disease   . Lung cancer ([place])   . Mini stroke ([place])   . Stroke ([place])    x's 2  . Vertigo     Family History: Family History  Problem Relation Age of Onset  . Heart attack Father   . Heart disease Father     Social History   Socioeconomic History  . Marital status: Widowed    Spouse name: Not on file  . Number of children: Not on file  . Years of education: Not on file  . Highest education level: Not on file  Occupational History  . Not on file  Tobacco Use  . Smoking status: Former Smoker    Types: Cigarettes  . Smokeless tobacco: Former Systems developer    Types: Chew    Quit date: 06/05/2020  . Tobacco comment: Quit X30 years ago  Vaping Use  . Vaping Use: Never used  Substance and Sexual Activity  . Alcohol use: No  . Drug use: No  . Sexual activity: Not on file  Other Topics Concern  . Not on file  Social History Narrative  . Not on file   Social Determinants of Health   Financial Resource Strain: Not on file  Food Insecurity: Not on file  Transportation Needs: Not on file  Physical Activity: Not on file  Stress: Not on file  Social Connections: Not on file  Intimate Partner Violence: Not on file      Review of Systems  Constitutional: Negative for chills, diaphoresis and fatigue.  HENT: Negative for ear pain, postnasal drip and sinus pressure.   Eyes: Negative for photophobia, discharge, redness, itching and visual disturbance.  Respiratory: Negative for cough, shortness of breath and wheezing.   Cardiovascular: Negative for chest pain, palpitations and leg swelling.  Gastrointestinal: Negative for abdominal pain, constipation, diarrhea, nausea and vomiting.  Genitourinary: Negative for dysuria and flank pain.  Musculoskeletal: Negative for arthralgias, back pain, gait problem and neck pain.  Skin: Negative for color change.  Allergic/Immunologic:  Negative for environmental allergies and food allergies.  Neurological: Negative for dizziness and headaches.  Hematological: Does not bruise/bleed easily.  Psychiatric/Behavioral: Positive for sleep disturbance. Negative for agitation, behavioral problems (depression) and hallucinations. The patient is nervous/anxious.     Vital Signs: BP (!) 124/52   Pulse 66   Temp 97.6 F (36.4 C)   Resp 16   Ht 5\' 2"  (1.575 m)   Wt 136 lb (61.7 kg)   SpO2 97%   BMI 24.87 kg/m    Physical Exam Vitals reviewed.  Constitutional:      Appearance: Normal appearance. She is normal weight.  Cardiovascular:     Rate and Rhythm: Normal rate and regular rhythm.     Pulses: Normal pulses.     Heart sounds: Normal  heart sounds.  Pulmonary:     Effort: Pulmonary effort is normal.     Breath sounds: Normal breath sounds.  Abdominal:     General: Abdomen is flat.     Palpations: Abdomen is soft.  Musculoskeletal:        General: Normal range of motion.     Cervical back: Normal range of motion.  Skin:    General: Skin is warm.  Neurological:     General: No focal deficit present.     Mental Status: She is alert and oriented to person, place, and time. Mental status is at baseline.  Psychiatric:        Mood and Affect: Mood normal.        Behavior: Behavior normal.        Thought Content: Thought content normal.        Judgment: Judgment normal.    Assessment/Plan: 1. Type 2 diabetes mellitus with hyperglycemia, without long-term current use of insulin (HCC) A1C 7.0 on target, continue with current management and routine monitoring - POCT HgB A1C  2. Essential hypertension, benign Discontinue amlodipine due to hypotension and high fall risk Continue with Ziac  3. Late onset Alzheimer's disease with behavioral disturbance ([place]) Continue with Seroquel, may utilize xanax as needed for irritability or insomnia [place] Controlled Substance Database was reviewed by me for overdose risk score  (ORS) - QUEtiapine (SEROQUEL) 50 MG tablet; Take 1 tablet (50 mg total) by mouth at bedtime.  Dispense: 90 tablet; Refill: 1 - ALPRAZolam (XANAX) 0.25 MG tablet; Take 1 tablet (0.25 mg total) by mouth at bedtime as needed for anxiety.  Dispense: 30 tablet; Refill: 1  4. Acquired hypothyroidism Levels remain stable, continue with current dosing - levothyroxine (EUTHYROX) 75 MCG tablet; Take 1 tablet (75 mcg total) by mouth daily before breakfast.  Dispense: 90 tablet; Refill: 0  5. History of gout Requested caregiver discuss with Ms. Sharpe son about her last gout attacks, may be able to discontinue if last gout flare not within recent past - allopurinol (ZYLOPRIM) 100 MG tablet; TAKE 1 & 1/2 (ONE & ONE-HALF) TABLETS BY MOUTH NIGHTLY FOR  GOUT  Dispense: 60 tablet; Refill: 0  6. Encounter for long-term current use of high risk medication - POCT Urine Drug Screen  General Counseling: Jermia verbalizes understanding of the findings of todays visit and agrees with plan of treatment. I have discussed any further diagnostic evaluation that may be needed or ordered today. We also reviewed her medications today. she has been encouraged to call the office with any questions or concerns that should arise related to todays visit.    Orders Placed This Encounter  Procedures  . POCT Urine Drug Screen  . POCT HgB A1C    Meds ordered this encounter  Medications  . DISCONTD: ALPRAZolam (XANAX) 0.25 MG tablet    Sig: Take 1 tablet (0.25 mg total) by mouth at bedtime as needed for anxiety.    Dispense:  30 tablet    Refill:  2  . QUEtiapine (SEROQUEL) 50 MG tablet    Sig: Take 1 tablet (50 mg total) by mouth at bedtime.    Dispense:  90 tablet    Refill:  1  . levothyroxine (EUTHYROX) 75 MCG tablet    Sig: Take 1 tablet (75 mcg total) by mouth daily before breakfast.    Dispense:  90 tablet    Refill:  0  . allopurinol (ZYLOPRIM) 100 MG tablet    Sig: TAKE 1 &  1/2 (ONE & ONE-HALF) TABLETS BY  MOUTH NIGHTLY FOR  GOUT    Dispense:  60 tablet    Refill:  0    Pt need appt for refills  . ALPRAZolam (XANAX) 0.25 MG tablet    Sig: Take 1 tablet (0.25 mg total) by mouth at bedtime as needed for anxiety.    Dispense:  30 tablet    Refill:  1    Time spent: 30 Minutes Time spent includes review of chart, medications, test results and follow-up plan with the patient.  This patient was seen by Theodoro Grist AGNP-C in Collaboration with Dr Lavera Guise as a part of collaborative care agreement     Tanna Furry. Aveena Bari AGNP-C Internal medicine

## 2021-02-01 ENCOUNTER — Encounter: Payer: Self-pay | Admitting: Hospice and Palliative Medicine

## 2021-02-08 DIAGNOSIS — E11649 Type 2 diabetes mellitus with hypoglycemia without coma: Secondary | ICD-10-CM | POA: Diagnosis not present

## 2021-02-08 DIAGNOSIS — E1165 Type 2 diabetes mellitus with hyperglycemia: Secondary | ICD-10-CM | POA: Diagnosis not present

## 2021-02-24 ENCOUNTER — Other Ambulatory Visit: Payer: Self-pay | Admitting: Internal Medicine

## 2021-03-10 DIAGNOSIS — E11649 Type 2 diabetes mellitus with hypoglycemia without coma: Secondary | ICD-10-CM | POA: Diagnosis not present

## 2021-03-10 DIAGNOSIS — E1165 Type 2 diabetes mellitus with hyperglycemia: Secondary | ICD-10-CM | POA: Diagnosis not present

## 2021-03-14 ENCOUNTER — Other Ambulatory Visit: Payer: Self-pay

## 2021-03-14 MED ORDER — BISOPROLOL-HYDROCHLOROTHIAZIDE 5-6.25 MG PO TABS: 1.0000 | ORAL_TABLET | Freq: Every day | ORAL | 0 refills | Status: DC

## 2021-03-28 ENCOUNTER — Telehealth: Payer: Self-pay | Admitting: Internal Medicine

## 2021-03-28 NOTE — Progress Notes (Signed)
  Chronic Care Management   Note  03/28/2021 Name: Samantha Franco MRN: [id number] DOB: 02-18-1929  Samantha Franco is a 87 y.o. year old female who is a primary care patient of Lavera Guise, MD. I reached out to Wyman Songster by phone today in response to a referral sent by Samantha Franco PCP, Lavera Guise, MD.   Samantha Franco was given information about Chronic Care Management services today including:  1. CCM service includes personalized support from designated clinical staff supervised by her physician, including individualized plan of care and coordination with other care providers 2. 24/7 contact phone numbers for assistance for urgent and routine care needs. 3. Service will only be billed when office clinical staff spend 20 minutes or more in a month to coordinate care. 4. Only one practitioner may furnish and bill the service in a calendar month. 5. The patient may stop CCM services at any time (effective at the end of the month) by phone call to the office staff.   Patient agreed to services and verbal consent obtained.   Follow up plan:   [place]

## 2021-04-02 DIAGNOSIS — Z20822 Contact with and (suspected) exposure to covid-19: Secondary | ICD-10-CM | POA: Diagnosis not present

## 2021-04-02 DIAGNOSIS — U071 COVID-19: Secondary | ICD-10-CM | POA: Diagnosis not present

## 2021-04-03 ENCOUNTER — Ambulatory Visit (INDEPENDENT_AMBULATORY_CARE_PROVIDER_SITE_OTHER): Payer: Medicare Other | Admitting: Internal Medicine

## 2021-04-03 ENCOUNTER — Encounter: Payer: Self-pay | Admitting: Internal Medicine

## 2021-04-03 VITALS — Resp 16 | Ht 62.0 in | Wt 136.0 lb

## 2021-04-03 DIAGNOSIS — U071 COVID-19: Secondary | ICD-10-CM | POA: Diagnosis not present

## 2021-04-03 DIAGNOSIS — R5383 Other fatigue: Secondary | ICD-10-CM

## 2021-04-03 NOTE — Progress Notes (Signed)
[facility] [place], [place] [address]  Internal MEDICINE  Telephone Visit  Patient Name: Samantha Franco  [id number]  [id number]  Date of Service: 04/03/2021  I connected with the patient at by telephone and verified the patients identity using two identifiers.   I discussed the limitations, risks, security and privacy concerns of performing an evaluation and management service by telephone and the availability of in person appointments. I also discussed with the patient that there may be a patient responsible charge related to the service.  The patient expressed understanding and agrees to proceed.    Chief Complaint  Patient presents with  . Acute Visit    Covid positive 04-02-21, tired, voice raspy, can pt do antibody treatments   . Telephone Assessment    Phone call, son will 3 way call his mom   . Telephone Screen    [phone]  . Quality Metric Gaps    Eye exam, foot exam    HPI Patient is connected via audio visit.  Her son is in the room with her.  She was tested positive with COVID yesterday she attended a birthday party for her older sister and there were more than 10 people in the room at the time her sister is also tested positive been connected with patient patient seems okay she denies any cough congestion chest pain shortness of breath however does feel tired she is vaccinated fully with COVID vaccine with 1 booster    Current Medication: Outpatient Encounter Medications as of 04/03/2021  Medication Sig  . allopurinol (ZYLOPRIM) 100 MG tablet TAKE 1 & 1/2 (ONE & ONE-HALF) TABLETS BY MOUTH NIGHTLY FOR  GOUT  . ALPRAZolam (XANAX) 0.25 MG tablet Take 1 tablet (0.25 mg total) by mouth at bedtime as needed for anxiety.  Marland Kitchen aspirin 325 MG tablet Take 325 mg by mouth daily.  . bisoprolol-hydrochlorothiazide (ZIAC) 5-6.25 MG tablet Take 1 tablet by mouth daily.  . Continuous Blood Gluc Sensor MISC Use as directed every 14 days. May dispense  FreeStyle [employer] or similar. diag E11.65  . docusate sodium (COLACE) 100 MG capsule Take 100 mg by mouth daily.  . famotidine (PEPCID) 40 MG tablet Take 1 tablet (40 mg total) by mouth daily.  Marland Kitchen glucose blood (ACCU-CHEK AVIVA PLUS) test strip USE 1 STRIP TO CHECK GLUCOSE TWICE DAILY diag E11.65  . LEVEMIR FLEXTOUCH 100 UNIT/ML FlexPen INJECT 12 UNITS SUBCUTANEOUSLY ONCE DAILY  . levothyroxine (EUTHYROX) 75 MCG tablet Take 1 tablet (75 mcg total) by mouth daily before breakfast.  . memantine (NAMENDA) 5 MG tablet Take 2 tablets (10 mg total) by mouth 2 (two) times daily.  . QUEtiapine (SEROQUEL) 50 MG tablet Take 1 tablet (50 mg total) by mouth at bedtime.  . terbinafine (LAMISIL) 1 % cream Apply 1 application topically 2 (two) times daily.  . traMADol (ULTRAM) 50 MG tablet Take 1 tablet (50 mg total) by mouth every 8 (eight) hours as needed for moderate pain.  Marland Kitchen triamcinolone (KENALOG) 0.025 % cream Apply 1 application topically 2 (two) times daily.   No facility-administered encounter medications on file as of 04/03/2021.    Surgical History: Past Surgical History:  Procedure Laterality Date  . CARDIAC CATHETERIZATION     [facility]  . CATARACT EXTRACTION    . LUNG LOBECTOMY    . THYROID SURGERY      Medical History: Past Medical History:  Diagnosis Date  . Arthritis   . Chorea   . COPD (  chronic obstructive pulmonary disease) ([place])   . Diabetes mellitus without complication ([place])   . Ear infection   . Environmental allergies   . GERD (gastroesophageal reflux disease)   . Hx of rheumatic fever   . Hx: UTI (urinary tract infection)   . Hyperlipidemia   . Hyperlipidemia   . Hypertension   . Hypothyroidism   . Kidney disease   . Lung cancer ([place])   . Mini stroke ([place])   . Stroke ([place])    x's 2  . Vertigo     Family History: Family History  Problem Relation Age of Onset  . Heart attack Father   . Heart disease Father     Social History   Socioeconomic  History  . Marital status: Widowed    Spouse name: Not on file  . Number of children: Not on file  . Years of education: Not on file  . Highest education level: Not on file  Occupational History  . Not on file  Tobacco Use  . Smoking status: Former Smoker    Types: Cigarettes  . Smokeless tobacco: Former Systems developer    Types: Chew    Quit date: 06/05/2020  . Tobacco comment: Quit X30 years ago  Vaping Use  . Vaping Use: Never used  Substance and Sexual Activity  . Alcohol use: No  . Drug use: No  . Sexual activity: Not on file  Other Topics Concern  . Not on file  Social History Narrative  . Not on file   Social Determinants of Health   Financial Resource Strain: Not on file  Food Insecurity: Not on file  Transportation Needs: Not on file  Physical Activity: Not on file  Stress: Not on file  Social Connections: Not on file  Intimate Partner Violence: Not on file      Review of Systems  Constitutional: Positive for fatigue. Negative for chills, diaphoresis and fever.  HENT: Negative for congestion, ear pain, mouth sores, postnasal drip and sinus pressure.   Eyes: Negative for photophobia, discharge, redness, itching and visual disturbance.  Respiratory: Negative for cough, shortness of breath and wheezing.   Cardiovascular: Negative for chest pain, palpitations and leg swelling.  Gastrointestinal: Negative for abdominal pain, constipation, diarrhea, nausea and vomiting. Anal bleeding: covcovis.  Genitourinary: Negative for dysuria and flank pain.  Musculoskeletal: Negative for arthralgias, back pain, gait problem and neck pain.  Skin: Negative for color change.  Allergic/Immunologic: Negative for environmental allergies and food allergies.  Neurological: Negative for dizziness and headaches.  Hematological: Does not bruise/bleed easily.  Psychiatric/Behavioral: Negative.  Negative for agitation, behavioral problems (depression) and hallucinations.    Vital Signs: Resp  16   Ht 5\' 2"  (1.575 m)   Wt 136 lb (61.7 kg)   BMI 24.87 kg/m    Observation/Objective: Patient was not in any distress   Assessment/Plan: 1. Lab test positive for detection of COVID-19 virus Patients test positive for COVID however does not have any acute symptoms son in the room with her and he was instructed to monitor he also is questioning about use of monoclonal antibody however patient does not meet the criteria  2. Other fatigue Given the patient is having excessive fatigue she does not want need any therapy at this point they were instructed to increase her p.o. intake of fluids and just monitor her symptoms give [place] a call in the office tomorrow for further recommendations after getting update on patient's condition  General Counseling: Brianny verbalizes understanding of the  findings of today's phone visit and agrees with plan of treatment. I have discussed any further diagnostic evaluation that may be needed or ordered today. We also reviewed her medications today. she has been encouraged to call the office with any questions or concerns that should arise related to todays visit.    Time spent [facility] Internal medicine

## 2021-04-08 ENCOUNTER — Encounter: Payer: Self-pay | Admitting: Nurse Practitioner

## 2021-04-08 ENCOUNTER — Encounter: Payer: Self-pay | Admitting: Internal Medicine

## 2021-04-09 ENCOUNTER — Other Ambulatory Visit: Payer: Self-pay

## 2021-04-09 DIAGNOSIS — G301 Alzheimer's disease with late onset: Secondary | ICD-10-CM

## 2021-04-09 DIAGNOSIS — F0280 Dementia in other diseases classified elsewhere without behavioral disturbance: Secondary | ICD-10-CM

## 2021-04-09 MED ORDER — PREDNISONE 10 MG PO TABS: ORAL_TABLET | ORAL | 0 refills | Status: DC

## 2021-04-09 MED ORDER — AZITHROMYCIN 250 MG PO TABS: ORAL_TABLET | ORAL | 0 refills | Status: AC

## 2021-04-09 MED ORDER — MEMANTINE HCL 5 MG PO TABS: 10.0000 mg | ORAL_TABLET | Freq: Two times a day (BID) | ORAL | 1 refills | Status: DC

## 2021-04-10 DIAGNOSIS — E11649 Type 2 diabetes mellitus with hypoglycemia without coma: Secondary | ICD-10-CM | POA: Diagnosis not present

## 2021-04-10 DIAGNOSIS — E1165 Type 2 diabetes mellitus with hyperglycemia: Secondary | ICD-10-CM | POA: Diagnosis not present

## 2021-04-11 ENCOUNTER — Other Ambulatory Visit: Payer: Self-pay

## 2021-04-11 DIAGNOSIS — F0280 Dementia in other diseases classified elsewhere without behavioral disturbance: Secondary | ICD-10-CM

## 2021-04-11 MED ORDER — MEMANTINE HCL 5 MG PO TABS: 10.0000 mg | ORAL_TABLET | Freq: Two times a day (BID) | ORAL | 1 refills | Status: DC

## 2021-04-16 ENCOUNTER — Encounter: Payer: Self-pay | Admitting: Physician Assistant

## 2021-04-16 ENCOUNTER — Ambulatory Visit (INDEPENDENT_AMBULATORY_CARE_PROVIDER_SITE_OTHER): Payer: Medicare Other | Admitting: Physician Assistant

## 2021-04-16 VITALS — BP 115/80 | HR 53 | Temp 97.9°F | Ht 64.0 in | Wt 140.0 lb

## 2021-04-16 DIAGNOSIS — R55 Syncope and collapse: Secondary | ICD-10-CM | POA: Diagnosis not present

## 2021-04-16 DIAGNOSIS — R42 Dizziness and giddiness: Secondary | ICD-10-CM

## 2021-04-16 DIAGNOSIS — I10 Essential (primary) hypertension: Secondary | ICD-10-CM

## 2021-04-16 DIAGNOSIS — R630 Anorexia: Secondary | ICD-10-CM | POA: Diagnosis not present

## 2021-04-16 DIAGNOSIS — U071 COVID-19: Secondary | ICD-10-CM

## 2021-04-16 NOTE — Progress Notes (Signed)
[facility] [place], [place] [address]  Internal MEDICINE  Telephone Visit  Patient Name: Samantha Franco  [id number]  [id number]  Date of Service: 04/26/2021  I connected with the patient at 3:13 by telephone and verified the patients identity using two identifiers.   I discussed the limitations, risks, security and privacy concerns of performing an evaluation and management service by telephone and the availability of in person appointments. I also discussed with the patient that there may be a patient responsible charge related to the service.  The patient expressed understanding and agrees to proceed.    Chief Complaint  Patient presents with   Telephone Assessment    Telephone pt tested positive 2 weeks ago  and still positive    Telephone Screen    [phone]   Dizziness    Np appetite    HPI Pt is here for virtual sick visit. Caregiver is with her currently. -Covid positive last week, feels weak, no appetite, and is now dizzy. No nausea, or vomiting, congestion, headaches, CP, or SOB. -Still taking prednisone and antibiotic, staying the same, not getting better or worse. She is sleeping a lot. Forces herself to eat a little. Caregiver thinks she is a little worse and agrees that patient has not been eating/drinking enough. -Sugar has been ok at home per caregiver -Discussed vitals obtained by caretaker and will hold Ziac tomorrow if BP/HR still low and pt dizzy.  Current Medication: Outpatient Encounter Medications as of 04/16/2021  Medication Sig   allopurinol (ZYLOPRIM) 100 MG tablet TAKE 1 & 1/2 (ONE & ONE-HALF) TABLETS BY MOUTH NIGHTLY FOR  GOUT   ALPRAZolam (XANAX) 0.25 MG tablet Take 1 tablet (0.25 mg total) by mouth at bedtime as needed for anxiety.   aspirin 325 MG tablet Take 325 mg by mouth daily.   bisoprolol-hydrochlorothiazide (ZIAC) 5-6.25 MG tablet Take 1 tablet by mouth daily.   Continuous Blood Gluc Sensor MISC Use as directed every  14 days. May dispense FreeStyle [employer] or similar. diag E11.65   docusate sodium (COLACE) 100 MG capsule Take 100 mg by mouth daily.   glucose blood (ACCU-CHEK AVIVA PLUS) test strip USE 1 STRIP TO CHECK GLUCOSE TWICE DAILY diag E11.65   LEVEMIR FLEXTOUCH 100 UNIT/ML FlexPen INJECT 12 UNITS SUBCUTANEOUSLY ONCE DAILY   levothyroxine (EUTHYROX) 75 MCG tablet Take 1 tablet (75 mcg total) by mouth daily before breakfast.   memantine (NAMENDA) 5 MG tablet Take 2 tablets (10 mg total) by mouth 2 (two) times daily.   predniSONE (DELTASONE) 10 MG tablet Take one tablet by mouth 3 x day for 3 days, then take one tab 2 x day for 3 days, and then take one tab a day for 3 days   QUEtiapine (SEROQUEL) 50 MG tablet Take 1 tablet (50 mg total) by mouth at bedtime.   terbinafine (LAMISIL) 1 % cream Apply 1 application topically 2 (two) times daily.   traMADol (ULTRAM) 50 MG tablet Take 1 tablet (50 mg total) by mouth every 8 (eight) hours as needed for moderate pain.   triamcinolone (KENALOG) 0.025 % cream Apply 1 application topically 2 (two) times daily.   famotidine (PEPCID) 40 MG tablet Take 1 tablet (40 mg total) by mouth daily.   No facility-administered encounter medications on file as of 04/16/2021.    Surgical History: Past Surgical History:  Procedure Laterality Date   CARDIAC CATHETERIZATION     [facility]   CATARACT EXTRACTION  LUNG LOBECTOMY     THYROID SURGERY      Medical History: Past Medical History:  Diagnosis Date   Arthritis    Chorea    COPD (chronic obstructive pulmonary disease) ([place])    Diabetes mellitus without complication (HCC)    Ear infection    Environmental allergies    GERD (gastroesophageal reflux disease)    Hx of rheumatic fever    Hx: UTI (urinary tract infection)    Hyperlipidemia    Hyperlipidemia    Hypertension    Hypothyroidism    Kidney disease    Lung cancer ([place])    Mini stroke ([place])    Stroke ([place])    x's 2   Vertigo     Family  History: Family History  Problem Relation Age of Onset   Heart attack Father    Heart disease Father     Social History   Socioeconomic History   Marital status: Widowed    Spouse name: Not on file   Number of children: Not on file   Years of education: Not on file   Highest education level: Not on file  Occupational History   Not on file  Tobacco Use   Smoking status: Former    Pack years: 0.00    Types: Cigarettes   Smokeless tobacco: Former    Types: Chew    Quit date: 06/05/2020   Tobacco comments:    Quit X30 years ago  Vaping Use   Vaping Use: Never used  Substance and Sexual Activity   Alcohol use: No   Drug use: No   Sexual activity: Not on file  Other Topics Concern   Not on file  Social History Narrative   Not on file   Social Determinants of Health   Financial Resource Strain: Not on file  Food Insecurity: Not on file  Transportation Needs: Not on file  Physical Activity: Not on file  Stress: Not on file  Social Connections: Not on file  Intimate Partner Violence: Not on file      Review of Systems  Constitutional:  Positive for appetite change and fatigue. Negative for fever.  HENT:  Negative for congestion, mouth sores and postnasal drip.   Respiratory:  Positive for cough. Negative for shortness of breath and wheezing.   Cardiovascular:  Negative for chest pain.  Gastrointestinal:  Negative for nausea and vomiting.  Genitourinary:  Negative for flank pain.  Neurological:  Positive for dizziness and weakness. Negative for headaches.  Psychiatric/Behavioral: Negative.     Vital Signs: BP 115/80   Pulse (!) 53   Temp 97.9 F (36.6 C)   Ht 5\' 4"  (1.626 m)   Wt 140 lb (63.5 kg)   BMI 24.03 kg/m    Observation/Objective:  Pt able to carry out conversation  Assessment/Plan: 1. Lab test positive for detection of COVID-19 virus Patient will complete prednisone and antibiotic.  Patient also encouraged to increase fluid intake as well as  increase food intake.  2. Essential hypertension, benign Will hold Ziac while patient is currently symptomatic with dizziness and has relatively low blood pressure and heart rate.  Caregiver will monitor vitals closely.  If dizziness improves and blood pressure/heart rate rise may restart medication.  3. Dizzy Patient encouraged to increase food and fluid intake.  Patient is likely dehydrated and if this progresses further may need to go to ED for possible IV.  Patient and caregiver aware and caregiver also updating family.  4. Loss of appetite  Encouraged to increase intake in small quantities throughout the day in addition to increased fluids.  Likely still result of viral infection and will monitor closely.  If dehydration and lack of appetite worsen patient may need to go to ED.   General Counseling: vung kush understanding of the findings of today's phone visit and agrees with plan of treatment. I have discussed any further diagnostic evaluation that may be needed or ordered today. We also reviewed her medications today. she has been encouraged to call the office with any questions or concerns that should arise related to todays visit.    No orders of the defined types were placed in this encounter.   No orders of the defined types were placed in this encounter.   Time spent:30 Minutes    Dr Lavera Guise Internal medicine

## 2021-05-03 ENCOUNTER — Encounter: Payer: Self-pay | Admitting: Physician Assistant

## 2021-05-03 ENCOUNTER — Ambulatory Visit (INDEPENDENT_AMBULATORY_CARE_PROVIDER_SITE_OTHER): Payer: Medicare Other | Admitting: Physician Assistant

## 2021-05-03 DIAGNOSIS — E1165 Type 2 diabetes mellitus with hyperglycemia: Secondary | ICD-10-CM | POA: Diagnosis not present

## 2021-05-03 DIAGNOSIS — E039 Hypothyroidism, unspecified: Secondary | ICD-10-CM

## 2021-05-03 DIAGNOSIS — I10 Essential (primary) hypertension: Secondary | ICD-10-CM

## 2021-05-03 DIAGNOSIS — G301 Alzheimer's disease with late onset: Secondary | ICD-10-CM | POA: Diagnosis not present

## 2021-05-03 DIAGNOSIS — Z794 Long term (current) use of insulin: Secondary | ICD-10-CM

## 2021-05-03 DIAGNOSIS — F0281 Dementia in other diseases classified elsewhere with behavioral disturbance: Secondary | ICD-10-CM

## 2021-05-03 DIAGNOSIS — F02818 Dementia in other diseases classified elsewhere, unspecified severity, with other behavioral disturbance: Secondary | ICD-10-CM

## 2021-05-03 MED ORDER — ALPRAZOLAM 0.25 MG PO TABS: 0.2500 mg | ORAL_TABLET | Freq: Every evening | ORAL | 1 refills | Status: DC | PRN

## 2021-05-03 MED ORDER — FREESTYLE LIBRE 2 READER DEVI: 0 refills | Status: AC

## 2021-05-03 MED ORDER — BD PEN NEEDLE NANO 2ND GEN 32G X 4 MM MISC: 3 refills | Status: AC

## 2021-05-03 MED ORDER — ACCU-CHEK AVIVA PLUS VI STRP: ORAL_STRIP | 5 refills | Status: AC

## 2021-05-03 MED ORDER — QUETIAPINE FUMARATE 100 MG PO TABS: 100.0000 mg | ORAL_TABLET | Freq: Every day | ORAL | 1 refills | Status: DC

## 2021-05-03 NOTE — Progress Notes (Signed)
[facility] [place], [place] [address]  Internal MEDICINE  Telephone Visit  Patient Name: Samantha Franco  [id number]  [id number]  Date of Service: 05/06/2021  I connected with the patient at 3:30 by telephone and verified the patients identity using two identifiers.   I discussed the limitations, risks, security and privacy concerns of performing an evaluation and management service by telephone and the availability of in person appointments. I also discussed with the patient that there may be a patient responsible charge related to the service.  The patient expressed understanding and agrees to proceed.    Chief Complaint  Patient presents with   Follow-up    Discuss meds   Telephone Screen    Phone call   Telephone Assessment    [phone]   Quality Metric Gaps    Eye exam, foot exam, shingrix    HPI Pt is seen today for a virtual visit with son and daughter present -pt has been feeling down and can get agitated with caregiver at times. Would like to try increasing seroquel at this time. -She is taking her levothyroxine and memantine -Her BP have been stable on ziac alone. Not taking amlodipine anymore because was having low readings. -needs xanax refill to help with acute anxiety and difficulty sleeping to be used as needed -Taking 12 units insulin daily, Needs needles and test strips, also interested in freestyle [place] and needs receiver for this--they have the sensor already. BG fasting avg 100-150 variable. Since she was unable to come in person today will order A1c to be done at [place] in the next week or so as able. -She recently recovered from covid and is doing much better now. Did complete course of steroids which may contribute to elevated BG  Current Medication: Outpatient Encounter Medications as of 05/03/2021  Medication Sig   allopurinol (ZYLOPRIM) 100 MG tablet TAKE 1 & 1/2 (ONE & ONE-HALF) TABLETS BY MOUTH NIGHTLY FOR  GOUT    aspirin 325 MG tablet Take 325 mg by mouth daily.   bisoprolol-hydrochlorothiazide (ZIAC) 5-6.25 MG tablet Take 1 tablet by mouth daily.   Continuous Blood Gluc Sensor MISC Use as directed every 14 days. May dispense FreeStyle [employer] or similar. diag E11.65   docusate sodium (COLACE) 100 MG capsule Take 100 mg by mouth daily.   LEVEMIR FLEXTOUCH 100 UNIT/ML FlexPen INJECT 12 UNITS SUBCUTANEOUSLY ONCE DAILY   levothyroxine (EUTHYROX) 75 MCG tablet Take 1 tablet (75 mcg total) by mouth daily before breakfast.   memantine (NAMENDA) 5 MG tablet Take 2 tablets (10 mg total) by mouth 2 (two) times daily.   predniSONE (DELTASONE) 10 MG tablet Take one tablet by mouth 3 x day for 3 days, then take one tab 2 x day for 3 days, and then take one tab a day for 3 days   QUEtiapine (SEROQUEL) 100 MG tablet Take 1 tablet (100 mg total) by mouth at bedtime.   terbinafine (LAMISIL) 1 % cream Apply 1 application topically 2 (two) times daily.   traMADol (ULTRAM) 50 MG tablet Take 1 tablet (50 mg total) by mouth every 8 (eight) hours as needed for moderate pain.   triamcinolone (KENALOG) 0.025 % cream Apply 1 application topically 2 (two) times daily.   [DISCONTINUED] ALPRAZolam (XANAX) 0.25 MG tablet Take 1 tablet (0.25 mg total) by mouth at bedtime as needed for anxiety.   [DISCONTINUED] Continuous Blood Gluc Receiver (FREESTYLE LIBRE 2 READER) DEVI by Does not apply route.   [  DISCONTINUED] glucose blood (ACCU-CHEK AVIVA PLUS) test strip USE 1 STRIP TO CHECK GLUCOSE TWICE DAILY diag E11.65   [DISCONTINUED] Insulin Pen Needle (BD PEN NEEDLE NANO 2ND GEN) 32G X 4 MM MISC by Does not apply route.   [DISCONTINUED] QUEtiapine (SEROQUEL) 50 MG tablet Take 1 tablet (50 mg total) by mouth at bedtime.   ALPRAZolam (XANAX) 0.25 MG tablet Take 1 tablet (0.25 mg total) by mouth at bedtime as needed for anxiety.   Continuous Blood Gluc Receiver (FREESTYLE LIBRE 2 READER) DEVI Use as directed DX E11.65   famotidine  (PEPCID) 40 MG tablet Take 1 tablet (40 mg total) by mouth daily.   glucose blood (ACCU-CHEK AVIVA PLUS) test strip USE 1 STRIP TO CHECK GLUCOSE TWICE DAILY diag E11.65   Insulin Pen Needle (BD PEN NEEDLE NANO 2ND GEN) 32G X 4 MM MISC Use as directed with insulin   No facility-administered encounter medications on file as of 05/03/2021.    Surgical History: Past Surgical History:  Procedure Laterality Date   CARDIAC CATHETERIZATION     [facility]   CATARACT EXTRACTION     LUNG LOBECTOMY     THYROID SURGERY      Medical History: Past Medical History:  Diagnosis Date   Arthritis    Chorea    COPD (chronic obstructive pulmonary disease) ([place])    Diabetes mellitus without complication (HCC)    Ear infection    Environmental allergies    GERD (gastroesophageal reflux disease)    Hx of rheumatic fever    Hx: UTI (urinary tract infection)    Hyperlipidemia    Hyperlipidemia    Hypertension    Hypothyroidism    Kidney disease    Lung cancer ([place])    Mini stroke ([place])    Stroke ([place])    x's 2   Vertigo     Family History: Family History  Problem Relation Age of Onset   Kidney disease Mother    Cancer Mother    Arthritis Mother    Heart attack Father    Heart disease Father    Arthritis Sister    ADD / ADHD Son     Social History   Socioeconomic History   Marital status: Widowed    Spouse name: Not on file   Number of children: Not on file   Years of education: Not on file   Highest education level: Not on file  Occupational History   Not on file  Tobacco Use   Smoking status: Former    Pack years: 0.00    Types: Cigarettes   Smokeless tobacco: Former    Types: Chew    Quit date: 06/05/2020   Tobacco comments:    Quit X30 years ago  Vaping Use   Vaping Use: Never used  Substance and Sexual Activity   Alcohol use: No   Drug use: No   Sexual activity: Not on file  Other Topics Concern   Not on file  Social History Narrative   Not on file   Social  Determinants of Health   Financial Resource Strain: Not on file  Food Insecurity: Not on file  Transportation Needs: Not on file  Physical Activity: Not on file  Stress: Not on file  Social Connections: Not on file  Intimate Partner Violence: Not on file      Review of Systems  Constitutional:  Positive for fatigue. Negative for chills and unexpected weight change.  HENT:  Negative for congestion, postnasal drip, rhinorrhea, sneezing  and sore throat.   Eyes:  Negative for redness.  Respiratory:  Negative for cough, chest tightness and shortness of breath.   Cardiovascular:  Negative for chest pain and palpitations.  Gastrointestinal:  Negative for abdominal pain, constipation, diarrhea, nausea and vomiting.  Genitourinary:  Negative for dysuria and frequency.  Musculoskeletal:  Negative for arthralgias, back pain, joint swelling and neck pain.  Skin:  Negative for rash.  Neurological: Negative.  Negative for tremors and numbness.  Hematological:  Negative for adenopathy. Does not bruise/bleed easily.  Psychiatric/Behavioral:  Positive for agitation, behavioral problems (Depression) and sleep disturbance. Negative for suicidal ideas. The patient is nervous/anxious.    Vital Signs: BP 139/75   Pulse 68   Temp (!) 97.5 F (36.4 C)   Resp 16   Ht 5\' 3"  (1.6 m)   Wt 135 lb (61.2 kg)   SpO2 94%   BMI 23.91 kg/m    Observation/Objective:  Pt is able to participate in conversation.   Assessment/Plan: 1. Type 2 diabetes mellitus with hyperglycemia, with long-term current use of insulin (HCC) Will update A1c via [facility] since patient was unable to come into office today.  Refills of pen needles and test strips were sent in addition to freestyle [place].  We will continue on current therapy and adjust as indicated - glucose blood (ACCU-CHEK AVIVA PLUS) test strip; USE 1 STRIP TO CHECK GLUCOSE TWICE DAILY diag E11.65  Dispense: 200 each; Refill: 5 - Hgb A1C w/o eAG -  Continuous Blood Gluc Receiver (FREESTYLE LIBRE 2 READER) DEVI; Use as directed DX E11.65  Dispense: 1 each; Refill: 0 - Insulin Pen Needle (BD PEN NEEDLE NANO 2ND GEN) 32G X 4 MM MISC; Use as directed with insulin  Dispense: 100 each; Refill: 3  2. Essential hypertension, benign Stable, continue Ziac  3. Late onset Alzheimer's disease with behavioral disturbance ([place]) Due to increased agitation and depressed feelings will increase Seroquel to 100 mg.  Patient may also continue to use Xanax as needed for acute anxiety and difficulty sleeping - ALPRAZolam (XANAX) 0.25 MG tablet; Take 1 tablet (0.25 mg total) by mouth at bedtime as needed for anxiety.  Dispense: 30 tablet; Refill: 1 - QUEtiapine (SEROQUEL) 100 MG tablet; Take 1 tablet (100 mg total) by mouth at bedtime.  Dispense: 90 tablet; Refill: 1  4. Acquired hypothyroidism Continue levothyroxine   General Counseling: [place] verbalizes understanding of the findings of today's phone visit and agrees with plan of treatment. I have discussed any further diagnostic evaluation that may be needed or ordered today. We also reviewed her medications today. she has been encouraged to call the office with any questions or concerns that should arise related to todays visit.    Orders Placed This Encounter  Procedures   Hgb A1C w/o eAG     Meds ordered this encounter  Medications   glucose blood (ACCU-CHEK AVIVA PLUS) test strip    Sig: USE 1 STRIP TO CHECK GLUCOSE TWICE DAILY diag E11.65    Dispense:  200 each    Refill:  5    Please consider 90 day supplies to promote better adherence   Continuous Blood Gluc Receiver (FREESTYLE LIBRE 2 READER) DEVI    Sig: Use as directed DX E11.65    Dispense:  1 each    Refill:  0   Insulin Pen Needle (BD PEN NEEDLE NANO 2ND GEN) 32G X 4 MM MISC    Sig: Use as directed with insulin    Dispense:  100 each    Refill:  3   ALPRAZolam (XANAX) 0.25 MG tablet    Sig: Take 1 tablet (0.25 mg total) by mouth  at bedtime as needed for anxiety.    Dispense:  30 tablet    Refill:  1   QUEtiapine (SEROQUEL) 100 MG tablet    Sig: Take 1 tablet (100 mg total) by mouth at bedtime.    Dispense:  90 tablet    Refill:  1     Time spent:30 Minutes    Dr Lavera Guise Internal medicine

## 2021-05-09 ENCOUNTER — Telehealth: Payer: Self-pay

## 2021-05-09 NOTE — Telephone Encounter (Signed)
Faxed request for additional documentation for medical necessity to [phone] for document audit/review team

## 2021-05-09 NOTE — Telephone Encounter (Signed)
LMOM for clarification on paperwork received

## 2021-05-10 ENCOUNTER — Other Ambulatory Visit: Payer: Self-pay | Admitting: Physician Assistant

## 2021-05-10 ENCOUNTER — Telehealth: Payer: Self-pay

## 2021-05-10 DIAGNOSIS — Z111 Encounter for screening for respiratory tuberculosis: Secondary | ICD-10-CM

## 2021-05-10 DIAGNOSIS — E11649 Type 2 diabetes mellitus with hypoglycemia without coma: Secondary | ICD-10-CM | POA: Diagnosis not present

## 2021-05-10 DIAGNOSIS — E1165 Type 2 diabetes mellitus with hyperglycemia: Secondary | ICD-10-CM | POA: Diagnosis not present

## 2021-05-10 NOTE — Telephone Encounter (Signed)
LMOM pt needs to have chest x-ray done and to sign paperwork to be released to [facility]

## 2021-05-15 ENCOUNTER — Telehealth: Payer: Self-pay | Admitting: Pharmacist

## 2021-05-15 NOTE — Progress Notes (Addendum)
Chronic Care Management Pharmacy Assistant   Name: Samantha Franco  MRN: [id number] DOB: [birth year]  Samantha Franco is an 87 y.o. year old female who presents for his initial CCM visit with the clinical pharmacist.  Reason for Encounter: Chart Prep   Conditions to be addressed/monitored: HTN, Type 2 DM, GAD, Osteoarthritis.   Primary concerns for visit include: HTN, Type 2 DM.   Recent office visits: 05/03/21 Mylinda Latina, PA-C. For follow-up. INCREASED Quetiapine to 100 mg daily at bedtime.  04/16/21 McDonough, Si Gaul, PA-C. For COVID-19. Per note: Will hold Ziac while patient is currently symptomatic with dizziness and has relatively low blood pressure and heart rate. 04/03/21 Dr. Humphrey Rolls. For COVID-19. No medication changes. 01/31/21 Harris,Taylor S, NP. For follow-up. STOPPED amlodipine.   Recent consult visits:  None in the last six months  Hospital visits:  None in previous 6 months  Medications: Outpatient Encounter Medications as of 05/15/2021  Medication Sig   allopurinol (ZYLOPRIM) 100 MG tablet TAKE 1 & 1/2 (ONE & ONE-HALF) TABLETS BY MOUTH NIGHTLY FOR  GOUT   ALPRAZolam (XANAX) 0.25 MG tablet Take 1 tablet (0.25 mg total) by mouth at bedtime as needed for anxiety.   aspirin 325 MG tablet Take 325 mg by mouth daily.   bisoprolol-hydrochlorothiazide (ZIAC) 5-6.25 MG tablet Take 1 tablet by mouth daily.   Continuous Blood Gluc Receiver (FREESTYLE LIBRE 2 READER) DEVI Use as directed DX E11.65   Continuous Blood Gluc Sensor MISC Use as directed every 14 days. May dispense FreeStyle [employer] or similar. diag E11.65   docusate sodium (COLACE) 100 MG capsule Take 100 mg by mouth daily.   famotidine (PEPCID) 40 MG tablet Take 1 tablet (40 mg total) by mouth daily.   glucose blood (ACCU-CHEK AVIVA PLUS) test strip USE 1 STRIP TO CHECK GLUCOSE TWICE DAILY diag E11.65   Insulin Pen Needle (BD PEN NEEDLE NANO 2ND GEN) 32G X 4 MM MISC Use as directed with  insulin   LEVEMIR FLEXTOUCH 100 UNIT/ML FlexPen INJECT 12 UNITS SUBCUTANEOUSLY ONCE DAILY   levothyroxine (EUTHYROX) 75 MCG tablet Take 1 tablet (75 mcg total) by mouth daily before breakfast.   memantine (NAMENDA) 5 MG tablet Take 2 tablets (10 mg total) by mouth 2 (two) times daily.   predniSONE (DELTASONE) 10 MG tablet Take one tablet by mouth 3 x day for 3 days, then take one tab 2 x day for 3 days, and then take one tab a day for 3 days   QUEtiapine (SEROQUEL) 100 MG tablet Take 1 tablet (100 mg total) by mouth at bedtime.   terbinafine (LAMISIL) 1 % cream Apply 1 application topically 2 (two) times daily.   traMADol (ULTRAM) 50 MG tablet Take 1 tablet (50 mg total) by mouth every 8 (eight) hours as needed for moderate pain.   triamcinolone (KENALOG) 0.025 % cream Apply 1 application topically 2 (two) times daily.   No facility-administered encounter medications on file as of 05/15/2021.    Have you seen any other providers since your last visit? Patients son stated no.  Any changes in your medications or health? Patients son stated no.  Any side effects from any medications? Patents son stated no.  Do you have an symptoms or problems not managed by your medications? Patients son stated no.  Any concerns about your health right now? Patients son stated she sleeps 20-22 hours throughout the day, she weeks, and she's loosing weight, eats tiny amounts.  Has your  provider asked that you check blood pressure, blood sugar, or follow special diet at home? Patients son stated she monitors her blood sugar.   Do you get any type of exercise on a regular basis? Patients son stated no.  Can you think of a goal you would like to reach for your health? Patients son stated no.  Do you have any problems getting your medications? Patients son stated no.  Is there anything that you would like to discuss during the appointment? Patients son stated no.  Please bring medications and supplements to  appointment  [place], [place] Pharmacist Assistant [phone]

## 2021-05-16 ENCOUNTER — Telehealth: Payer: Medicare Other

## 2021-05-25 ENCOUNTER — Telehealth: Payer: Self-pay

## 2021-05-25 NOTE — Telephone Encounter (Signed)
Spoke to pt's son [name], he will come by next week to sign the [employer] paperwork for [place] to release medical records to [facility]

## 2021-05-29 ENCOUNTER — Telehealth: Payer: Self-pay

## 2021-05-29 NOTE — Telephone Encounter (Signed)
Mailed appointment revision notice to patient-Samantha Franco

## 2021-06-04 ENCOUNTER — Other Ambulatory Visit: Payer: Self-pay | Admitting: Internal Medicine

## 2021-06-10 DIAGNOSIS — E11649 Type 2 diabetes mellitus with hypoglycemia without coma: Secondary | ICD-10-CM | POA: Diagnosis not present

## 2021-06-10 DIAGNOSIS — I10 Essential (primary) hypertension: Secondary | ICD-10-CM | POA: Diagnosis not present

## 2021-06-10 DIAGNOSIS — E1165 Type 2 diabetes mellitus with hyperglycemia: Secondary | ICD-10-CM | POA: Diagnosis not present

## 2021-06-10 DIAGNOSIS — N182 Chronic kidney disease, stage 2 (mild): Secondary | ICD-10-CM | POA: Diagnosis not present

## 2021-07-04 ENCOUNTER — Other Ambulatory Visit: Payer: Self-pay

## 2021-07-04 ENCOUNTER — Other Ambulatory Visit: Payer: Self-pay | Admitting: Physician Assistant

## 2021-07-04 DIAGNOSIS — G301 Alzheimer's disease with late onset: Secondary | ICD-10-CM

## 2021-07-04 DIAGNOSIS — F0281 Dementia in other diseases classified elsewhere with behavioral disturbance: Secondary | ICD-10-CM

## 2021-07-04 DIAGNOSIS — E039 Hypothyroidism, unspecified: Secondary | ICD-10-CM

## 2021-07-04 DIAGNOSIS — F0280 Dementia in other diseases classified elsewhere without behavioral disturbance: Secondary | ICD-10-CM

## 2021-07-04 MED ORDER — LEVOTHYROXINE SODIUM 75 MCG PO TABS: 75.0000 ug | ORAL_TABLET | Freq: Every day | ORAL | 0 refills | Status: DC

## 2021-07-04 MED ORDER — MEMANTINE HCL 5 MG PO TABS: 10.0000 mg | ORAL_TABLET | Freq: Two times a day (BID) | ORAL | 1 refills | Status: DC

## 2021-07-09 ENCOUNTER — Other Ambulatory Visit: Payer: Self-pay

## 2021-07-09 DIAGNOSIS — E039 Hypothyroidism, unspecified: Secondary | ICD-10-CM

## 2021-07-09 MED ORDER — LEVOTHYROXINE SODIUM 75 MCG PO TABS: 75.0000 ug | ORAL_TABLET | Freq: Every day | ORAL | 0 refills | Status: DC

## 2021-07-10 ENCOUNTER — Telehealth: Payer: Self-pay | Admitting: Internal Medicine

## 2021-07-10 NOTE — Chronic Care Management (AMB) (Signed)
Left vm to r/s missed apt

## 2021-07-11 DIAGNOSIS — E11649 Type 2 diabetes mellitus with hypoglycemia without coma: Secondary | ICD-10-CM | POA: Diagnosis not present

## 2021-07-11 DIAGNOSIS — E1165 Type 2 diabetes mellitus with hyperglycemia: Secondary | ICD-10-CM | POA: Diagnosis not present

## 2021-08-10 DIAGNOSIS — E11649 Type 2 diabetes mellitus with hypoglycemia without coma: Secondary | ICD-10-CM | POA: Diagnosis not present

## 2021-08-10 DIAGNOSIS — E1165 Type 2 diabetes mellitus with hyperglycemia: Secondary | ICD-10-CM | POA: Diagnosis not present

## 2021-08-15 ENCOUNTER — Ambulatory Visit: Payer: Medicare Other | Admitting: Nurse Practitioner

## 2021-08-20 ENCOUNTER — Telehealth: Payer: Self-pay

## 2021-08-20 NOTE — Telephone Encounter (Signed)
Left vm to confirm 08/21/21 appointment-Toni

## 2021-08-21 ENCOUNTER — Ambulatory Visit: Payer: Medicare Other | Admitting: Nurse Practitioner

## 2021-08-21 DIAGNOSIS — Z0289 Encounter for other administrative examinations: Secondary | ICD-10-CM

## 2021-09-10 DIAGNOSIS — E1165 Type 2 diabetes mellitus with hyperglycemia: Secondary | ICD-10-CM | POA: Diagnosis not present

## 2021-09-10 DIAGNOSIS — E11649 Type 2 diabetes mellitus with hypoglycemia without coma: Secondary | ICD-10-CM | POA: Diagnosis not present

## 2021-09-21 ENCOUNTER — Telehealth: Payer: Self-pay

## 2021-09-21 NOTE — Telephone Encounter (Signed)
Lvm to schedule wellness visit-Samantha Franco

## 2021-09-27 ENCOUNTER — Ambulatory Visit (INDEPENDENT_AMBULATORY_CARE_PROVIDER_SITE_OTHER): Payer: Medicare Other | Admitting: Nurse Practitioner

## 2021-09-27 ENCOUNTER — Other Ambulatory Visit: Payer: Self-pay

## 2021-09-27 ENCOUNTER — Encounter: Payer: Self-pay | Admitting: Nurse Practitioner

## 2021-09-27 VITALS — BP 166/60 | HR 80 | Temp 98.4°F | Resp 16 | Ht 64.0 in | Wt 121.0 lb

## 2021-09-27 DIAGNOSIS — E039 Hypothyroidism, unspecified: Secondary | ICD-10-CM | POA: Diagnosis not present

## 2021-09-27 DIAGNOSIS — Z794 Long term (current) use of insulin: Secondary | ICD-10-CM

## 2021-09-27 DIAGNOSIS — Z0001 Encounter for general adult medical examination with abnormal findings: Secondary | ICD-10-CM

## 2021-09-27 DIAGNOSIS — G301 Alzheimer's disease with late onset: Secondary | ICD-10-CM | POA: Diagnosis not present

## 2021-09-27 DIAGNOSIS — F02818 Dementia in other diseases classified elsewhere, unspecified severity, with other behavioral disturbance: Secondary | ICD-10-CM | POA: Diagnosis not present

## 2021-09-27 DIAGNOSIS — R300 Dysuria: Secondary | ICD-10-CM

## 2021-09-27 DIAGNOSIS — E1165 Type 2 diabetes mellitus with hyperglycemia: Secondary | ICD-10-CM

## 2021-09-27 DIAGNOSIS — Z8739 Personal history of other diseases of the musculoskeletal system and connective tissue: Secondary | ICD-10-CM

## 2021-09-27 DIAGNOSIS — M5386 Other specified dorsopathies, lumbar region: Secondary | ICD-10-CM

## 2021-09-27 LAB — POCT GLYCOSYLATED HEMOGLOBIN (HGB A1C): Hemoglobin A1C: 6.7 % — AB (ref 4.0–5.6)

## 2021-09-27 MED ORDER — LEVEMIR FLEXTOUCH 100 UNIT/ML ~~LOC~~ SOPN: 12.0000 [IU] | PEN_INJECTOR | Freq: Every day | SUBCUTANEOUS | 3 refills | Status: DC

## 2021-09-27 MED ORDER — ALLOPURINOL 100 MG PO TABS: ORAL_TABLET | ORAL | 0 refills | Status: DC

## 2021-09-27 MED ORDER — TRAMADOL HCL 50 MG PO TABS: 50.0000 mg | ORAL_TABLET | Freq: Three times a day (TID) | ORAL | 0 refills | Status: DC | PRN

## 2021-09-27 MED ORDER — ALPRAZOLAM 0.25 MG PO TABS: 0.2500 mg | ORAL_TABLET | Freq: Every evening | ORAL | 2 refills | Status: DC | PRN

## 2021-09-27 MED ORDER — LEVOTHYROXINE SODIUM 75 MCG PO TABS
75.0000 ug | ORAL_TABLET | Freq: Every day | ORAL | 0 refills | Status: DC
Start: 2021-09-27 — End: 2022-01-16

## 2021-09-27 NOTE — Progress Notes (Signed)
[facility], [place] [address]  Internal MEDICINE  Office Visit Note  Patient Name: Samantha Franco  [id number]  [id number]  Date of Service: 09/27/2021      Chief Complaint  Patient presents with   Medicare Wellness    Discuss weight, pt complains about feeling bad but doesn't clarify well   Diabetes   Gastroesophageal Reflux   Hyperlipidemia   Hypertension   COPD   Arthritis    HPI Samantha Franco presents for an annual well visit and physical exam. She is a well appearing 87 yo female. She has not had a recent eye exam and has had no recent vaccinations. She is  due for her diabetic foot exam today. Her A1C has improved from 7.0 in march to 6.7 today.  She has decreased kidney function but is having issues with arthritis pain and would like to have medication prescribed for it.  She needs refills on medications. Blood pressure is elevated, will reevaluate at next office visit.    Current Medication: Outpatient Encounter Medications as of 09/27/2021  Medication Sig   aspirin 325 MG tablet Take 325 mg by mouth daily.   bisoprolol-hydrochlorothiazide (ZIAC) 5-6.25 MG tablet Take 1 tablet by mouth once daily   Continuous Blood Gluc Receiver (FREESTYLE LIBRE 2 READER) DEVI Use as directed DX E11.65   Continuous Blood Gluc Sensor MISC Use as directed every 14 days. May dispense FreeStyle [employer] or similar. diag E11.65   docusate sodium (COLACE) 100 MG capsule Take 100 mg by mouth at bedtime.   glucose blood (ACCU-CHEK AVIVA PLUS) test strip USE 1 STRIP TO CHECK GLUCOSE TWICE DAILY diag E11.65   Insulin Pen Needle (BD PEN NEEDLE NANO 2ND GEN) 32G X 4 MM MISC Use as directed with insulin   memantine (NAMENDA) 5 MG tablet Take 2 tablets (10 mg total) by mouth 2 (two) times daily.   predniSONE (DELTASONE) 10 MG tablet Take one tablet by mouth 3 x day for 3 days, then take one tab 2 x day for 3 days, and then take one tab a day for 3 days    QUEtiapine (SEROQUEL) 100 MG tablet Take 1 tablet (100 mg total) by mouth at bedtime. (Patient taking differently: Take 50 mg by mouth at bedtime.)   terbinafine (LAMISIL) 1 % cream Apply 1 application topically 2 (two) times daily.   [DISCONTINUED] allopurinol (ZYLOPRIM) 100 MG tablet TAKE 1 & 1/2 (ONE & ONE-HALF) TABLETS BY MOUTH NIGHTLY FOR  GOUT   [DISCONTINUED] ALPRAZolam (XANAX) 0.25 MG tablet TAKE 1 TABLET BY MOUTH AT BEDTIME AS NEEDED FOR ANXIETY   [DISCONTINUED] LEVEMIR FLEXTOUCH 100 UNIT/ML FlexPen INJECT 12 UNITS SUBCUTANEOUSLY ONCE DAILY   [DISCONTINUED] levothyroxine (EUTHYROX) 75 MCG tablet Take 1 tablet (75 mcg total) by mouth daily before breakfast.   [DISCONTINUED] traMADol (ULTRAM) 50 MG tablet Take 1 tablet (50 mg total) by mouth every 8 (eight) hours as needed for moderate pain.   [DISCONTINUED] triamcinolone (KENALOG) 0.025 % cream Apply 1 application topically 2 (two) times daily.   allopurinol (ZYLOPRIM) 100 MG tablet TAKE 1 & 1/2 (ONE & ONE-HALF) TABLETS BY MOUTH NIGHTLY FOR  GOUT (Patient taking differently: Take 150 mg by mouth at bedtime as needed (gout symptoms).)   ALPRAZolam (XANAX) 0.25 MG tablet Take 1 tablet (0.25 mg total) by mouth at bedtime as needed for anxiety or sleep.   insulin detemir (LEVEMIR FLEXTOUCH) 100 UNIT/ML FlexPen Inject 12 Units into the skin at bedtime.  levothyroxine (EUTHYROX) 75 MCG tablet Take 1 tablet (75 mcg total) by mouth daily before breakfast.   [DISCONTINUED] famotidine (PEPCID) 40 MG tablet Take 1 tablet (40 mg total) by mouth daily.   [DISCONTINUED] traMADol (ULTRAM) 50 MG tablet Take 1 tablet (50 mg total) by mouth every 8 (eight) hours as needed for moderate pain.   No facility-administered encounter medications on file as of 09/27/2021.    Surgical History: Past Surgical History:  Procedure Laterality Date   CARDIAC CATHETERIZATION     [facility]   CATARACT EXTRACTION     LUNG LOBECTOMY     THYROID SURGERY      Medical  History: Past Medical History:  Diagnosis Date   Arthritis    Chorea    COPD (chronic obstructive pulmonary disease) ([place])    Diabetes mellitus without complication (HCC)    Ear infection    Environmental allergies    GERD (gastroesophageal reflux disease)    Hx of rheumatic fever    Hx: UTI (urinary tract infection)    Hyperlipidemia    Hyperlipidemia    Hypertension    Hypothyroidism    Kidney disease    Lung cancer ([place])    Mini stroke    Stroke ([place])    x's 2   Vertigo     Family History: Family History  Problem Relation Age of Onset   Kidney disease Mother    Cancer Mother    Arthritis Mother    Heart attack Father    Heart disease Father    Arthritis Sister    ADD / ADHD Son     Social History   Socioeconomic History   Marital status: Widowed    Spouse name: Not on file   Number of children: Not on file   Years of education: Not on file   Highest education level: Not on file  Occupational History   Not on file  Tobacco Use   Smoking status: Former    Types: Cigarettes   Smokeless tobacco: Former    Types: Chew    Quit date: 06/05/2020   Tobacco comments:    Quit X30 years ago  Vaping Use   Vaping Use: Never used  Substance and Sexual Activity   Alcohol use: No   Drug use: No   Sexual activity: Not on file  Other Topics Concern   Not on file  Social History Narrative   Not on file   Social Determinants of Health   Financial Resource Strain: Not on file  Food Insecurity: Not on file  Transportation Needs: Not on file  Physical Activity: Not on file  Stress: Not on file  Social Connections: Not on file  Intimate Partner Violence: Not on file      Review of Systems  Constitutional:  Negative for activity change, appetite change, chills, fatigue, fever and unexpected weight change.  HENT: Negative.  Negative for congestion, ear pain, rhinorrhea, sore throat and trouble swallowing.   Eyes: Negative.   Respiratory: Negative.  Negative  for cough, chest tightness, shortness of breath and wheezing.   Cardiovascular: Negative.  Negative for chest pain.  Gastrointestinal: Negative.  Negative for abdominal pain, blood in stool, constipation, diarrhea, nausea and vomiting.  Endocrine: Negative.   Genitourinary: Negative.  Negative for difficulty urinating, dysuria, frequency, hematuria and urgency.  Musculoskeletal: Negative.  Negative for arthralgias, back pain, joint swelling, myalgias and neck pain.  Skin: Negative.  Negative for rash and wound.  Allergic/Immunologic: Negative.  Negative for immunocompromised  state.  Neurological: Negative.  Negative for dizziness, seizures, numbness and headaches.  Hematological: Negative.   Psychiatric/Behavioral: Negative.  Negative for behavioral problems, self-injury and suicidal ideas. The patient is not nervous/anxious.    Vital Signs: BP (!) 166/60   Pulse 80   Temp 98.4 F (36.9 C)   Resp 16   Ht 5\' 4"  (1.626 m)   Wt 121 lb (54.9 kg)   SpO2 97%   BMI 20.77 kg/m    Physical Exam Vitals reviewed.  Constitutional:      General: She is awake. She is not in acute distress.    Appearance: Normal appearance. She is well-developed and well-groomed. She is obese. She is not ill-appearing or diaphoretic.  HENT:     Head: Normocephalic and atraumatic.     Right Ear: Tympanic membrane, ear canal and external ear normal.     Left Ear: Tympanic membrane, ear canal and external ear normal.     Nose: Nose normal. No congestion or rhinorrhea.     Mouth/Throat:     Mouth: Mucous membranes are moist.     Pharynx: Oropharynx is clear. No oropharyngeal exudate or posterior oropharyngeal erythema.  Eyes:     General: No scleral icterus.       Right eye: No discharge.        Left eye: No discharge.     Extraocular Movements: Extraocular movements intact.     Conjunctiva/sclera: Conjunctivae normal.     Pupils: Pupils are equal, round, and reactive to light.  Neck:     Thyroid: No  thyromegaly.     Vascular: No carotid bruit or JVD.     Trachea: No tracheal deviation.  Cardiovascular:     Rate and Rhythm: Normal rate and regular rhythm.     Pulses:          Dorsalis pedis pulses are 1+ on the right side and 1+ on the left side.       Posterior tibial pulses are 1+ on the right side and 1+ on the left side.     Heart sounds: Normal heart sounds. No murmur heard.   No friction rub. No gallop.  Pulmonary:     Effort: Pulmonary effort is normal. No respiratory distress.     Breath sounds: Normal breath sounds. No stridor. No wheezing or rales.  Chest:     Chest wall: No tenderness.  Abdominal:     General: Bowel sounds are normal. There is no distension.     Palpations: Abdomen is soft. There is no mass.     Tenderness: There is no abdominal tenderness. There is no guarding or rebound.  Musculoskeletal:        General: No tenderness or deformity. Normal range of motion.     Cervical back: Normal range of motion and neck supple.     Right foot: Normal range of motion. No deformity, bunion, Charcot foot, foot drop or prominent metatarsal heads.     Left foot: Normal range of motion. No deformity, bunion, Charcot foot, foot drop or prominent metatarsal heads.  Feet:     Right foot:     Protective Sensation: 6 sites tested.  6 sites sensed.     Skin integrity: Callus and dry skin present. No ulcer, blister, skin breakdown, erythema, warmth or fissure.     Toenail Condition: Right toenails are abnormally thick and long.     Left foot:     Protective Sensation: 6 sites tested.  6 sites sensed.  Skin integrity: Callus and dry skin present. No ulcer, blister, skin breakdown, erythema, warmth or fissure.     Toenail Condition: Left toenails are abnormally thick and long.  Lymphadenopathy:     Cervical: No cervical adenopathy.  Skin:    General: Skin is warm and dry.     Capillary Refill: Capillary refill takes less than 2 seconds.     Coloration: Skin is not pale.      Findings: No erythema or rash.  Neurological:     Mental Status: She is alert. Mental status is at baseline.     Cranial Nerves: No cranial nerve deficit.     Motor: No abnormal muscle tone.     Coordination: Coordination normal.     Gait: Gait normal.     Deep Tendon Reflexes: Reflexes are normal and symmetric.  Psychiatric:        Mood and Affect: Mood normal.        Behavior: Behavior normal. Behavior is cooperative.        Thought Content: Thought content normal.        Judgment: Judgment normal.       Assessment/Plan: 1. Encounter for routine adult health examination with abnormal findings Age-appropriate preventive screenings and vaccinations discussed, annual physical exam completed. Routine labs for health maintenance not ordered for now due to age. Will ordered necessary labs as needed. Marland Kitchen PHM updated.   2. Type 2 diabetes mellitus with hyperglycemia, with long-term current use of insulin (HCC) Aic continues to improve, continue medications as prescribed. Repeat A1c in 3 months - POCT HgB A1C - insulin detemir (LEVEMIR FLEXTOUCH) 100 UNIT/ML FlexPen; Inject 12 Units into the skin at bedtime.  Dispense: 15 mL; Refill: 3  3. Sciatica associated with disorder of lumbar spine Patient may take tramadol for severe pain. Patient cannot take ibuprofen, meloxicam, celebrex, or diclofenac oral due to decreased kidney function.   4. Acquired hypothyroidism Continue as prescribed.  - levothyroxine (EUTHYROX) 75 MCG tablet; Take 1 tablet (75 mcg total) by mouth daily before breakfast.  Dispense: 90 tablet; Refill: 0  5. History of gout  Continue allopurinol as needed for gout - allopurinol (ZYLOPRIM) 100 MG tablet; TAKE 1 & 1/2 (ONE & ONE-HALF) TABLETS BY MOUTH NIGHTLY FOR  GOUT (Patient taking differently: Take 150 mg by mouth at bedtime as needed (gout symptoms).)  Dispense: 60 tablet; Refill: 0  6. Late onset Alzheimer's disease with behavioral disturbance (HCC) Alprazolam  refill ordered as needed to help when patienth as increased anxiety and behavioral disturbance.  - ALPRAZolam (XANAX) 0.25 MG tablet; Take 1 tablet (0.25 mg total) by mouth at bedtime as needed for anxiety or sleep.  Dispense: 30 tablet; Refill: 2  7. Dysuria Routine urinalysis done - UA/M w/rflx Culture, Routine - Microscopic Examination      General Counseling: Tylin verbalizes understanding of the findings of todays visit and agrees with plan of treatment. I have discussed any further diagnostic evaluation that may be needed or ordered today. We also reviewed her medications today. she has been encouraged to call the office with any questions or concerns that should arise related to todays visit.    Orders Placed This Encounter  Procedures   Microscopic Examination   UA/M w/rflx Culture, Routine   POCT HgB A1C    Meds ordered this encounter  Medications   insulin detemir (LEVEMIR FLEXTOUCH) 100 UNIT/ML FlexPen    Sig: Inject 12 Units into the skin at bedtime.    Dispense:  15  mL    Refill:  3   DISCONTD: traMADol (ULTRAM) 50 MG tablet    Sig: Take 1 tablet (50 mg total) by mouth every 8 (eight) hours as needed for moderate pain.    Dispense:  45 tablet    Refill:  0   levothyroxine (EUTHYROX) 75 MCG tablet    Sig: Take 1 tablet (75 mcg total) by mouth daily before breakfast.    Dispense:  90 tablet    Refill:  0   allopurinol (ZYLOPRIM) 100 MG tablet    Sig: TAKE 1 & 1/2 (ONE & ONE-HALF) TABLETS BY MOUTH NIGHTLY FOR  GOUT    Dispense:  60 tablet    Refill:  0    Pt need appt for refills   ALPRAZolam (XANAX) 0.25 MG tablet    Sig: Take 1 tablet (0.25 mg total) by mouth at bedtime as needed for anxiety or sleep.    Dispense:  30 tablet    Refill:  2    Return in about 3 months (around 12/28/2021) for F/U, Recheck A1C, Yanil Dawe PCP.   Total time spent:30 Minutes Time spent includes review of chart, medications, test results, and follow up plan with the patient.   [place]  Controlled Substance Database was reviewed by me.  This patient was seen by Jonetta Osgood, FNP-C in collaboration with Dr. Clayborn Bigness as a part of collaborative care agreement.  Radha Coggins R. Valetta Fuller, MSN, FNP-C Internal medicine

## 2021-09-28 LAB — UA/M W/RFLX CULTURE, ROUTINE
Bilirubin, UA: NEGATIVE
Glucose, UA: NEGATIVE
Ketones, UA: NEGATIVE
Leukocytes,UA: NEGATIVE
Nitrite, UA: NEGATIVE
Protein,UA: NEGATIVE
RBC, UA: NEGATIVE
Specific Gravity, UA: 1.014 (ref 1.005–1.030)
Urobilinogen, Ur: 0.2 mg/dL (ref 0.2–1.0)
pH, UA: 5 (ref 5.0–7.5)

## 2021-09-28 LAB — MICROSCOPIC EXAMINATION
Casts: NONE SEEN /lpf
Epithelial Cells (non renal): >10 /hpf — AB (ref 0–10)
RBC, Urine: NONE SEEN /hpf (ref 0–2)

## 2021-10-05 ENCOUNTER — Emergency Department: Payer: Medicare Other

## 2021-10-05 ENCOUNTER — Encounter: Payer: Self-pay | Admitting: Emergency Medicine

## 2021-10-05 ENCOUNTER — Observation Stay
Admission: EM | Admit: 2021-10-05 | Discharge: 2021-10-06 | Disposition: A | Discharge status: Home / Self Care | Payer: Medicare Other | Attending: Internal Medicine | Admitting: Internal Medicine

## 2021-10-05 ENCOUNTER — Other Ambulatory Visit: Payer: Self-pay

## 2021-10-05 DIAGNOSIS — F0280 Dementia in other diseases classified elsewhere without behavioral disturbance: Secondary | ICD-10-CM | POA: Diagnosis present

## 2021-10-05 DIAGNOSIS — R55 Syncope and collapse: Principal | ICD-10-CM | POA: Insufficient documentation

## 2021-10-05 DIAGNOSIS — N281 Cyst of kidney, acquired: Secondary | ICD-10-CM | POA: Diagnosis not present

## 2021-10-05 DIAGNOSIS — Z794 Long term (current) use of insulin: Secondary | ICD-10-CM | POA: Insufficient documentation

## 2021-10-05 DIAGNOSIS — G309 Alzheimer's disease, unspecified: Secondary | ICD-10-CM | POA: Insufficient documentation

## 2021-10-05 DIAGNOSIS — N1831 Chronic kidney disease, stage 3a: Secondary | ICD-10-CM | POA: Insufficient documentation

## 2021-10-05 DIAGNOSIS — Z79899 Other long term (current) drug therapy: Secondary | ICD-10-CM | POA: Insufficient documentation

## 2021-10-05 DIAGNOSIS — E119 Type 2 diabetes mellitus without complications: Secondary | ICD-10-CM | POA: Diagnosis not present

## 2021-10-05 DIAGNOSIS — I469 Cardiac arrest, cause unspecified: Secondary | ICD-10-CM | POA: Diagnosis not present

## 2021-10-05 DIAGNOSIS — R109 Unspecified abdominal pain: Secondary | ICD-10-CM | POA: Insufficient documentation

## 2021-10-05 DIAGNOSIS — Z85118 Personal history of other malignant neoplasm of bronchus and lung: Secondary | ICD-10-CM | POA: Diagnosis not present

## 2021-10-05 DIAGNOSIS — Z87891 Personal history of nicotine dependence: Secondary | ICD-10-CM | POA: Insufficient documentation

## 2021-10-05 DIAGNOSIS — R0902 Hypoxemia: Secondary | ICD-10-CM | POA: Diagnosis not present

## 2021-10-05 DIAGNOSIS — R2681 Unsteadiness on feet: Secondary | ICD-10-CM | POA: Diagnosis not present

## 2021-10-05 DIAGNOSIS — K5730 Diverticulosis of large intestine without perforation or abscess without bleeding: Secondary | ICD-10-CM | POA: Diagnosis not present

## 2021-10-05 DIAGNOSIS — Z20822 Contact with and (suspected) exposure to covid-19: Secondary | ICD-10-CM | POA: Insufficient documentation

## 2021-10-05 DIAGNOSIS — J449 Chronic obstructive pulmonary disease, unspecified: Secondary | ICD-10-CM | POA: Insufficient documentation

## 2021-10-05 DIAGNOSIS — E039 Hypothyroidism, unspecified: Secondary | ICD-10-CM | POA: Diagnosis present

## 2021-10-05 DIAGNOSIS — R404 Transient alteration of awareness: Secondary | ICD-10-CM | POA: Diagnosis not present

## 2021-10-05 DIAGNOSIS — I129 Hypertensive chronic kidney disease with stage 1 through stage 4 chronic kidney disease, or unspecified chronic kidney disease: Secondary | ICD-10-CM | POA: Insufficient documentation

## 2021-10-05 DIAGNOSIS — G301 Alzheimer's disease with late onset: Secondary | ICD-10-CM | POA: Diagnosis present

## 2021-10-05 DIAGNOSIS — R410 Disorientation, unspecified: Secondary | ICD-10-CM | POA: Diagnosis not present

## 2021-10-05 DIAGNOSIS — I10 Essential (primary) hypertension: Secondary | ICD-10-CM | POA: Diagnosis present

## 2021-10-05 DIAGNOSIS — R4182 Altered mental status, unspecified: Secondary | ICD-10-CM | POA: Diagnosis not present

## 2021-10-05 DIAGNOSIS — K429 Umbilical hernia without obstruction or gangrene: Secondary | ICD-10-CM | POA: Diagnosis not present

## 2021-10-05 DIAGNOSIS — I959 Hypotension, unspecified: Secondary | ICD-10-CM | POA: Diagnosis not present

## 2021-10-05 DIAGNOSIS — K7689 Other specified diseases of liver: Secondary | ICD-10-CM | POA: Diagnosis not present

## 2021-10-05 DIAGNOSIS — M19011 Primary osteoarthritis, right shoulder: Secondary | ICD-10-CM | POA: Diagnosis not present

## 2021-10-05 LAB — CBC WITH DIFFERENTIAL/PLATELET
Abs Immature Granulocytes: 0.03 10*3/uL (ref 0.00–0.07)
Basophils Absolute: 0.1 10*3/uL (ref 0.0–0.1)
Basophils Relative: 1 %
Eosinophils Absolute: 0.2 10*3/uL (ref 0.0–0.5)
Eosinophils Relative: 3 %
HCT: 39.4 % (ref 36.0–46.0)
Hemoglobin: 12.7 g/dL (ref 12.0–15.0)
Immature Granulocytes: 0 %
Lymphocytes Relative: 10 %
Lymphs Abs: 0.9 10*3/uL (ref 0.7–4.0)
MCH: 28.2 pg (ref 26.0–34.0)
MCHC: 32.2 g/dL (ref 30.0–36.0)
MCV: 87.4 fL (ref 80.0–100.0)
Monocytes Absolute: 0.5 10*3/uL (ref 0.1–1.0)
Monocytes Relative: 6 %
Neutro Abs: 6.6 10*3/uL (ref 1.7–7.7)
Neutrophils Relative %: 80 %
Platelets: 193 10*3/uL (ref 150–400)
RBC: 4.51 MIL/uL (ref 3.87–5.11)
RDW: 14.3 % (ref 11.5–15.5)
WBC: 8.3 10*3/uL (ref 4.0–10.5)
nRBC: 0 % (ref 0.0–0.2)

## 2021-10-05 LAB — COMPREHENSIVE METABOLIC PANEL
ALT: 9 U/L (ref 0–44)
AST: 14 U/L — ABNORMAL LOW (ref 15–41)
Albumin: 3.7 g/dL (ref 3.5–5.0)
Alkaline Phosphatase: 58 U/L (ref 38–126)
Anion gap: 9 (ref 5–15)
BUN: 33 mg/dL — ABNORMAL HIGH (ref 8–23)
CO2: 29 mmol/L (ref 22–32)
Calcium: 9 mg/dL (ref 8.9–10.3)
Chloride: 102 mmol/L (ref 98–111)
Creatinine, Ser: 1.57 mg/dL — ABNORMAL HIGH (ref 0.44–1.00)
GFR, Estimated: 31 mL/min — ABNORMAL LOW (ref >60)
Glucose, Bld: 150 mg/dL — ABNORMAL HIGH (ref 70–99)
Potassium: 4 mmol/L (ref 3.5–5.1)
Sodium: 140 mmol/L (ref 135–145)
Total Bilirubin: 0.9 mg/dL (ref 0.3–1.2)
Total Protein: 6.9 g/dL (ref 6.5–8.1)

## 2021-10-05 LAB — BRAIN NATRIURETIC PEPTIDE: B Natriuretic Peptide: 476.4 pg/mL — ABNORMAL HIGH (ref 0.0–100.0)

## 2021-10-05 LAB — RESP PANEL BY RT-PCR (FLU A&B, COVID) ARPGX2
Influenza A by PCR: NEGATIVE
Influenza B by PCR: NEGATIVE
SARS Coronavirus 2 by RT PCR: NEGATIVE

## 2021-10-05 LAB — CBG MONITORING, ED: Glucose-Capillary: 131 mg/dL — ABNORMAL HIGH (ref 70–99)

## 2021-10-05 LAB — TROPONIN I (HIGH SENSITIVITY): Troponin I (High Sensitivity): 9 ng/L (ref <18)

## 2021-10-05 LAB — LACTIC ACID, PLASMA: Lactic Acid, Venous: 1.4 mmol/L (ref 0.5–1.9)

## 2021-10-05 MED ORDER — SODIUM CHLORIDE 0.9% FLUSH
3.0000 mL | Freq: Two times a day (BID) | INTRAVENOUS | Status: DC
  Administered 2021-10-06: 3 mL via INTRAVENOUS

## 2021-10-05 MED ORDER — ONDANSETRON HCL 4 MG PO TABS: 4.0000 mg | ORAL_TABLET | Freq: Four times a day (QID) | ORAL | Status: DC | PRN

## 2021-10-05 MED ORDER — IOHEXOL 300 MG/ML  SOLN
75.0000 mL | Freq: Once | INTRAMUSCULAR | Status: AC | PRN
  Administered 2021-10-05: 75 mL via INTRAVENOUS

## 2021-10-05 MED ORDER — ONDANSETRON HCL 4 MG/2ML IJ SOLN: 4.0000 mg | Freq: Four times a day (QID) | INTRAMUSCULAR | Status: DC | PRN

## 2021-10-05 MED ORDER — ACETAMINOPHEN 325 MG PO TABS: 650.0000 mg | ORAL_TABLET | Freq: Four times a day (QID) | ORAL | Status: DC | PRN

## 2021-10-05 MED ORDER — SODIUM CHLORIDE 0.9 % IV SOLN: INTRAVENOUS | Status: AC

## 2021-10-05 MED ORDER — INSULIN ASPART 100 UNIT/ML IJ SOLN: 0.0000 [IU] | Freq: Every day | INTRAMUSCULAR | Status: DC

## 2021-10-05 MED ORDER — ENOXAPARIN SODIUM 30 MG/0.3ML IJ SOSY
30.0000 mg | PREFILLED_SYRINGE | INTRAMUSCULAR | Status: DC
  Administered 2021-10-06: 30 mg via SUBCUTANEOUS
  Filled 2021-10-05: qty 0.3

## 2021-10-05 MED ORDER — INSULIN ASPART 100 UNIT/ML IJ SOLN: 0.0000 [IU] | Freq: Three times a day (TID) | INTRAMUSCULAR | Status: DC

## 2021-10-05 MED ORDER — ACETAMINOPHEN 325 MG RE SUPP: 650.0000 mg | Freq: Four times a day (QID) | RECTAL | Status: DC | PRN

## 2021-10-05 NOTE — Progress Notes (Signed)
Family requested spiritual support following pt's collapse and talk of death.  Pt has spoken of being ready to die for some time, but told her daughter earlier that she was going to die today. Pt did not remember saying this when chaplain asked about it, with daughter's permission.  However, pt  welcomed prayer.  Pt appears in good spirits, however she and family are speaking to each other as if she is EOL.  Family appears to be grieving openly. Family does acknowledge that pt has cognitive lapses.  Provided emotional support, prayer with pt, and with family in lobby, facilitated visits and communication and offered hospitality.   Luana Shu [phone]    10/05/21 2130  Clinical Encounter Type  Visited With Patient and family together  Visit Type Initial;Spiritual support  Referral From Family  Consult/Referral To Chaplain  Spiritual Encounters  Spiritual Needs Prayer  Stress Factors  Patient Stress Factors Family relationships;Health changes  Family Stress Factors Family relationships

## 2021-10-05 NOTE — ED Provider Notes (Signed)
[facility] Emergency Department Provider Note   ____________________________________________   Event Date/Time   First MD Initiated Contact with Patient 10/05/21 2016     (approximate)  I have reviewed the triage vital signs and the nursing notes.   HISTORY  Chief Complaint Loss of Consciousness    HPI Samantha Franco is a 87 y.o. female who was riding in the car.  On the way back home patient was noticed to be stop breathing family could not find a pulse.  They called 911 reclined the patient's chair and began CPR.  EMS arrived and noticed the patient had abnormal breathing.  Patient then woke up.  She does not remember any of this.  She says she feels well but on examination of her abdomen she complains of lower abdominal pain with palpation.  She does have a history of mild dementia. She seems to be back at baseline per family.     Past Medical History:  Diagnosis Date   Arthritis    Chorea    COPD (chronic obstructive pulmonary disease) ([place])    Diabetes mellitus without complication (HCC)    Ear infection    Environmental allergies    GERD (gastroesophageal reflux disease)    Hx of rheumatic fever    Hx: UTI (urinary tract infection)    Hyperlipidemia    Hyperlipidemia    Hypertension    Hypothyroidism    Kidney disease    Lung cancer ([place])    Mini stroke    Stroke [facility])    x's 2   Vertigo     Patient Active Problem List   Diagnosis Date Noted   Syncope    Mucositis oral 02/20/2020   Gingivitis 02/20/2020   Diabetes mellitus, type II ([place]) 02/20/2020   GAD (generalized anxiety disorder) 02/20/2020   Encounter for general adult medical examination with abnormal findings 08/21/2019   Uncontrolled type 2 diabetes mellitus with hyperglycemia ([place]) 08/21/2019   Atopic dermatitis 08/21/2019   Acquired hypothyroidism 08/21/2019   Late onset Alzheimer's disease without behavioral disturbance ([place]) 08/21/2019   Dysuria 08/21/2019    White matter disease 02/06/2018   Bursitis of hip 12/17/2017   Osteoarthritis of knee 12/17/2017   Primary localized osteoarthritis of pelvic region and thigh 12/17/2017   Cervical spondylosis with myelopathy 07/23/2017   Closed nondisplaced fracture of second cervical vertebra with routine healing 07/23/2017   CAP (community acquired pneumonia) 11/11/2015   Pain in the chest 03/21/2015   Essential hypertension, benign 03/21/2015   Malignant neoplasm of unspecified part of unspecified bronchus or lung ([place]) 02/14/2014   Chronic cystitis 08/05/2013   Chronic kidney disease, stage II (mild) 08/05/2013   Incomplete emptying of bladder 08/05/2013   Kidney stone 08/05/2013   Microscopic hematuria 08/05/2013   Mixed incontinence 09/47/0962   Uncertain tumor of kidney and ureter 08/05/2013    Past Surgical History:  Procedure Laterality Date   CARDIAC CATHETERIZATION     [facility]   CATARACT EXTRACTION     LUNG LOBECTOMY     THYROID SURGERY      Prior to Admission medications   Medication Sig Start Date End Date Taking? Authorizing Provider  allopurinol (ZYLOPRIM) 100 MG tablet TAKE 1 & 1/2 (ONE & ONE-HALF) TABLETS BY MOUTH NIGHTLY FOR  GOUT 09/27/21   Jonetta Osgood, NP  ALPRAZolam (XANAX) 0.25 MG tablet Take 1 tablet (0.25 mg total) by mouth at bedtime as needed for anxiety or sleep. 09/27/21   Jonetta Osgood, NP  aspirin 325 MG tablet Take 325 mg by mouth daily.    [provider]  bisoprolol-hydrochlorothiazide [facility]) 5-6.25 MG tablet Take 1 tablet by mouth once daily 06/04/21   Lavera Guise, MD  Continuous Blood Gluc Receiver (FREESTYLE LIBRE 2 READER) DEVI Use as directed DX E11.65 05/03/21   McDonough, Si Gaul, PA-C  Continuous Blood Gluc Sensor MISC Use as directed every 14 days. May dispense FreeStyle [employer] or similar. diag E11.65 04/24/18   Lavera Guise, MD  docusate sodium (COLACE) 100 MG capsule Take 100 mg by mouth daily.    [provider]   famotidine (PEPCID) 40 MG tablet Take 1 tablet (40 mg total) by mouth daily. 10/06/17 10/06/18  Eula Listen, MD  glucose blood (ACCU-CHEK AVIVA PLUS) test strip USE 1 STRIP TO CHECK GLUCOSE TWICE DAILY diag E11.65 05/03/21   McDonough, Si Gaul, PA-C  insulin detemir (LEVEMIR FLEXTOUCH) 100 UNIT/ML FlexPen Inject 12 Units into the skin at bedtime. 09/27/21   Jonetta Osgood, NP  Insulin Pen Needle (BD PEN NEEDLE NANO 2ND GEN) 32G X 4 MM MISC Use as directed with insulin 05/03/21   McDonough, Si Gaul, PA-C  levothyroxine (EUTHYROX) 75 MCG tablet Take 1 tablet (75 mcg total) by mouth daily before breakfast. 09/27/21   Jonetta Osgood, NP  memantine (NAMENDA) 5 MG tablet Take 2 tablets (10 mg total) by mouth 2 (two) times daily. 07/04/21   Lavera Guise, MD  predniSONE (DELTASONE) 10 MG tablet Take one tablet by mouth 3 x day for 3 days, then take one tab 2 x day for 3 days, and then take one tab a day for 3 days 04/09/21   Lavera Guise, MD  QUEtiapine (SEROQUEL) 100 MG tablet Take 1 tablet (100 mg total) by mouth at bedtime. 05/03/21   McDonough, Si Gaul, PA-C  terbinafine (LAMISIL) 1 % cream Apply 1 application topically 2 (two) times daily. 05/06/19   Kendell Bane, NP  traMADol (ULTRAM) 50 MG tablet Take 1 tablet (50 mg total) by mouth every 8 (eight) hours as needed for moderate pain. 09/27/21   Jonetta Osgood, NP    Allergies Sulfa antibiotics and Vasotec [enalapril]  Family History  Problem Relation Age of Onset   Kidney disease Mother    Cancer Mother    Arthritis Mother    Heart attack Father    Heart disease Father    Arthritis Sister    ADD / ADHD Son     Social History Social History   Tobacco Use   Smoking status: Former    Types: Cigarettes   Smokeless tobacco: Former    Types: Chew    Quit date: 06/05/2020   Tobacco comments:    Quit X30 years ago  Vaping Use   Vaping Use: Never used  Substance Use Topics   Alcohol use: No   Drug use: No     Review of Systems  Constitutional: No fever/chills Eyes: No visual changes. ENT: No sore throat. Cardiovascular: Denies chest pain. Respiratory: Denies shortness of breath. Gastrointestinal: No abdominal pain.  No nausea, no vomiting.  No diarrhea.  No constipation. Genitourinary: Negative for dysuria. Musculoskeletal: Negative for back pain. Skin: Negative for rash. Neurological: Negative for headaches, focal weakness   ____________________________________________   PHYSICAL EXAM:  VITAL SIGNS: ED Triage Vitals  Enc Vitals Group     BP 10/05/21 2004 (!) 163/63     Pulse Rate 10/05/21 2004 67     Resp 10/05/21 2004  16     Temp 10/05/21 2004 98.1 F (36.7 C)     Temp Source 10/05/21 2004 Oral     SpO2 10/05/21 2004 95 %     Weight 10/05/21 2015 121 lb (54.9 kg)     Height 10/05/21 2015 5\' 4"  (1.626 m)     Head Circumference --      Peak Flow --      Pain Score 10/05/21 2004 0     Pain Loc --      Pain Edu? --      Excl. in [place]? --    Constitutional: Alert and oriented. Well appearing and in no acute distress. Eyes: Conjunctivae are normal. PERRL. EOMI. Head: Atraumatic. Nose: No congestion/rhinnorhea. Mouth/Throat: Mucous membranes are moist.  Oropharynx non-erythematous. Neck: No stridor. Cardiovascular: Normal rate, regular rhythm. Grossly normal heart sounds.  Good peripheral circulation. Respiratory: Normal respiratory effort.  No retractions. Lungs CTAB. Gastrointestinal: Soft tender to palpation diffusely across lower abdomen no distention. No abdominal bruits. Musculoskeletal: No lower extremity tenderness nor edema.  Neurologic:  Normal speech and language. No gross focal neurologic deficits are appreciated.  Skin:  Skin is warm, dry and intact. No rash noted.   ____________________________________________   LABS (all labs ordered are listed, but only abnormal results are displayed)  Labs Reviewed  COMPREHENSIVE METABOLIC PANEL - Abnormal; Notable  for the following components:      Result Value   Glucose, Bld 150 (*)    BUN 33 (*)    Creatinine, Ser 1.57 (*)    AST 14 (*)    GFR, Estimated 31 (*)    All other components within normal limits  BRAIN NATRIURETIC PEPTIDE - Abnormal; Notable for the following components:   B Natriuretic Peptide 476.4 (*)    All other components within normal limits  CBG MONITORING, ED - Abnormal; Notable for the following components:   Glucose-Capillary 131 (*)    All other components within normal limits  RESP PANEL BY RT-PCR (FLU A&B, COVID) ARPGX2  LACTIC ACID, PLASMA  CBC WITH DIFFERENTIAL/PLATELET  URINALYSIS, ROUTINE W REFLEX MICROSCOPIC  LACTIC ACID, PLASMA  TROPONIN I (HIGH SENSITIVITY)  TROPONIN I (HIGH SENSITIVITY)   ____________________________________________  EKG  EKG read interpreted by me shows normal sinus rhythm rate of 75 normal axis no acute ST-T wave changes ____________________________________________  RADIOLOGY Gertha Calkin, personally viewed and evaluated these images (plain radiographs) as part of my medical decision making, as well as reviewing the written report by the radiologist.  ED MD interpretation: Chest x-ray reviewed by me shows little bit of haziness in the right middle lobe otherwise no changes no old films are available for me to compare to when I saw the x-ray  Official radiology report(s): CT Head Wo Contrast  Result Date: 10/05/2021 CLINICAL DATA:  Altered mental status. EXAM: CT HEAD WITHOUT CONTRAST TECHNIQUE: Contiguous axial images were obtained from the base of the skull through the vertex without intravenous contrast. COMPARISON:  July 18, 2018 FINDINGS: Brain: There is moderate severity cerebral atrophy with widening of the extra-axial spaces and ventricular dilatation. There are areas of decreased attenuation within the white matter tracts of the supratentorial brain, consistent with microvascular disease changes. Vascular: No  hyperdense vessel or unexpected calcification. Skull: Normal. Negative for fracture or focal lesion. Sinuses/Orbits: No acute finding. Other: None. IMPRESSION: 1. Generalized cerebral atrophy. 2. No acute intracranial abnormality. Electronically Signed   By: Virgina Norfolk M.D.   On: 10/05/2021 21:59  CT ABDOMEN PELVIS W CONTRAST  Result Date: 10/05/2021 CLINICAL DATA:  Abnormal breathing, abdominal pain, stops breathing, underwent CPR by family EXAM: CT ABDOMEN AND PELVIS WITH CONTRAST TECHNIQUE: Multidetector CT imaging of the abdomen and pelvis was performed using the standard protocol following bolus administration of intravenous contrast. CONTRAST:  37mL OMNIPAQUE IOHEXOL 300 MG/ML SOLN IV. No oral contrast. COMPARISON:  CT abdomen 11/06/2010 FINDINGS: Lower chest: Nonspecific ground-glass opacity RIGHT lower lobe 20 x 9 mm. Linear subsegmental atelectasis LEFT lower lobe. Remaining lung bases clear. Hepatobiliary: Gallbladder unremarkable. Scattered small hepatic cysts and calcified granulomata. Additional nonspecific linear lucency in medial RIGHT lobe liver question minimal biliary dilatation. Pancreas: Markedly atrophic without mass Spleen: Normal appearance Adrenals/Urinary Tract: Adrenal glands are slightly thickened without focal mass. Cortical thinning and minimal cortical scarring both kidneys. BILATERAL renal cysts. Renal vascular calcifications. Cannot exclude tiny nonobstructing renal calculi. No hydronephrosis, hydroureter or ureteral calcification. Bladder unremarkable. Stomach/Bowel: Sigmoid diverticulosis without evidence of diverticulitis. Large and small bowel loops otherwise normal appearance. Appendix not visualized. Gastric wall appears minimally prominent especially at cardia though stomach is underdistended, question artifact. Vascular/Lymphatic: Atherosclerotic calcifications aorta, iliac arteries, coronary arteries. Aorta normal caliber. No adenopathy. Reproductive:  Unremarkable uterus and ovaries Other: No free air or free fluid. No inflammatory process. Tiny supraumbilical hernia containing fat. Musculoskeletal: Bones demineralized. IMPRESSION: Sigmoid diverticulosis without evidence of diverticulitis. Hepatic and renal cysts. Questionable minimal biliary dilatation of the medial RIGHT lobe liver, of uncertain etiology; recommend correlation with LFTs. Nonspecific ground-glass opacity RIGHT lower lobe 20 x 9 mm; if clinically indicated based on patient age and comorbidities, follow-up CT imaging recommended in 6 months to determine persistence. No acute intra-abdominal or intrapelvic process. Aortic Atherosclerosis (ICD10-I70.0). Electronically Signed   By: Lavonia Dana M.D.   On: 10/05/2021 22:05   DG Chest Portable 1 View  Result Date: 10/05/2021 CLINICAL DATA:  Status post CPR. EXAM: PORTABLE CHEST 1 VIEW COMPARISON:  Chest radiograph dated 11/18/2017. FINDINGS: No focal consolidation, pleural effusion, or pneumothorax. The cardiac silhouette is within normal limits. No acute osseous pathology. Degenerative changes of the spine and shoulders. IMPRESSION: No active disease. Electronically Signed   By: Anner Crete M.D.   On: 10/05/2021 20:55    ____________________________________________   PROCEDURES  Procedure(s) performed (including Critical Care):  Procedures   ____________________________________________   INITIAL IMPRESSION / ASSESSMENT AND PLAN / ED COURSE  ----------------------------------------- 8:48 PM on 10/05/2021 ----------------------------------------- Because of patient's abdominal pain I will go ahead and order a belly CT.  We will get a head CT as well since she had disordered breathing of some sort.  We will wait for the lab work to come back and get the chest x-ray read by the radiologist.  I anticipate that we will get this lady admitted overnight at least a telemetry in case she did actually have cardiopulmonary arrest  which seems to be the case would not wanted to happen again.              ____________________________________________   FINAL CLINICAL IMPRESSION(S) / ED DIAGNOSES  Final diagnoses:  Syncope and collapse   Successful cpr  ED Discharge Orders     None        Note:  This document was prepared using Dragon voice recognition software and may include unintentional dictation errors.    Nena Polio, MD 10/05/21 [phone]

## 2021-10-05 NOTE — ED Triage Notes (Addendum)
Pt arrived via [facility] from home, family with pt reports the patient stopped breathing and CPR was performed by family.  Per EMS on their arrival, pt was having abnormal breathing.  Family with pt state the pt was asleep in the car and tried to wake up pt but pt was unresponsive.  Pt's caregiver did not feel radial pulses and began CPR. Per EMS pt was out for about 5-10 mins

## 2021-10-05 NOTE — H&P (Signed)
History and Physical    Samantha Franco:[id number] DOB: 11/08/1929 DOA: 10/05/2021  PCP: Lavera Guise, MD   Patient coming from: home  I have personally briefly reviewed patient's relevant medical records in [place]  Chief Complaint: syncope  HPI: Samantha Franco is a 87 y.o. female with medical history significant for HTN, DM, dementia, childhood rheumatic fever, left carotid endarterectomy, history of TIA who was brought to the ER by EMS following a syncopal episode while  in the back of a car.  According to the family they tried to wake patient but she was unresponsive and they did not feel a pulse and began CPR.    By arrival of EMS patient had a pulse but was breathing abnormally though with normal saturations and eventually started coming around.  History is limited due to dementia but several family members at bedside.   Patient was previously in her usual state of health and celebrated Thanksgiving with her family the day prior.  On the morning of arrival she ate less than her usual amount of breakfast and was a little less active than her baseline.By arrival she appeared back to baseline according to the family at bedside  ED course: BP 163/63 with otherwise normal vitals Blood work: Creatinine 1.57 slightly above her baseline.  Troponin 9, BNP 476 with otherwise normal labs COVID and flu negative  EKG, personally viewed and interpreted: NSR at 75 with no acute ST-T wave changes  Imaging: CT head with no acute intracranial abnormality CT abdomen and pelvis with questionable minimal biliary dilatation of uncertain etiology, nonspecific groundglass opacity right lower lobe otherwise nonacute Chest x-ray no acute disease  Hospitalist consulted for admission    Review of Systems: Unreliable due to history of dementia  Past Medical History:  Diagnosis Date   Arthritis    Chorea    COPD (chronic obstructive pulmonary disease) ([place])    Diabetes mellitus without  complication ([place])    Ear infection    Environmental allergies    GERD (gastroesophageal reflux disease)    Hx of rheumatic fever    Hx: UTI (urinary tract infection)    Hyperlipidemia    Hyperlipidemia    Hypertension    Hypothyroidism    Kidney disease    Lung cancer ([place])    Mini stroke    Stroke ([place])    x's 2   Vertigo     Past Surgical History:  Procedure Laterality Date   CARDIAC CATHETERIZATION     [facility]   CATARACT EXTRACTION     LUNG LOBECTOMY     THYROID SURGERY       reports that she has quit smoking. Her smoking use included cigarettes. She quit smokeless tobacco use about 16 months ago.  Her smokeless tobacco use included chew. She reports that she does not drink alcohol and does not use drugs.  Allergies  Allergen Reactions   Sulfa Antibiotics    Vasotec [Enalapril] Swelling    Family History  Problem Relation Age of Onset   Kidney disease Mother    Cancer Mother    Arthritis Mother    Heart attack Father    Heart disease Father    Arthritis Sister    ADD / ADHD Son       Prior to Admission medications   Medication Sig Start Date End Date Taking? Authorizing Provider  allopurinol (ZYLOPRIM) 100 MG tablet TAKE 1 & 1/2 (ONE & ONE-HALF) TABLETS BY MOUTH NIGHTLY FOR  GOUT 09/27/21   Jonetta Osgood, NP  ALPRAZolam (XANAX) 0.25 MG tablet Take 1 tablet (0.25 mg total) by mouth at bedtime as needed for anxiety or sleep. 09/27/21   Jonetta Osgood, NP  aspirin 325 MG tablet Take 325 mg by mouth daily.    [provider]  bisoprolol-hydrochlorothiazide [facility]) 5-6.25 MG tablet Take 1 tablet by mouth once daily 06/04/21   Lavera Guise, MD  Continuous Blood Gluc Receiver (FREESTYLE LIBRE 2 READER) DEVI Use as directed DX E11.65 05/03/21   McDonough, Si Gaul, PA-C  Continuous Blood Gluc Sensor MISC Use as directed every 14 days. May dispense FreeStyle [employer] or similar. diag E11.65 04/24/18   Lavera Guise, MD  docusate sodium (COLACE)  100 MG capsule Take 100 mg by mouth daily.    [provider]  famotidine (PEPCID) 40 MG tablet Take 1 tablet (40 mg total) by mouth daily. 10/06/17 10/06/18  Eula Listen, MD  glucose blood (ACCU-CHEK AVIVA PLUS) test strip USE 1 STRIP TO CHECK GLUCOSE TWICE DAILY diag E11.65 05/03/21   McDonough, Si Gaul, PA-C  insulin detemir (LEVEMIR FLEXTOUCH) 100 UNIT/ML FlexPen Inject 12 Units into the skin at bedtime. 09/27/21   Jonetta Osgood, NP  Insulin Pen Needle (BD PEN NEEDLE NANO 2ND GEN) 32G X 4 MM MISC Use as directed with insulin 05/03/21   McDonough, Si Gaul, PA-C  levothyroxine (EUTHYROX) 75 MCG tablet Take 1 tablet (75 mcg total) by mouth daily before breakfast. 09/27/21   Jonetta Osgood, NP  memantine (NAMENDA) 5 MG tablet Take 2 tablets (10 mg total) by mouth 2 (two) times daily. 07/04/21   Lavera Guise, MD  predniSONE (DELTASONE) 10 MG tablet Take one tablet by mouth 3 x day for 3 days, then take one tab 2 x day for 3 days, and then take one tab a day for 3 days 04/09/21   Lavera Guise, MD  QUEtiapine (SEROQUEL) 100 MG tablet Take 1 tablet (100 mg total) by mouth at bedtime. 05/03/21   McDonough, Si Gaul, PA-C  terbinafine (LAMISIL) 1 % cream Apply 1 application topically 2 (two) times daily. 05/06/19   Kendell Bane, NP  traMADol (ULTRAM) 50 MG tablet Take 1 tablet (50 mg total) by mouth every 8 (eight) hours as needed for moderate pain. 09/27/21   Jonetta Osgood, NP    Physical Exam: Vitals:   10/05/21 2004 10/05/21 2015  BP: (!) 163/63   Pulse: 67   Resp: 16   Temp: 98.1 F (36.7 C)   TempSrc: Oral   SpO2: 95%   Weight:  54.9 kg  Height:  5\' 4"  (1.626 m)   Constitutional: Alert and oriented x 2 . Not in any apparent distress HEENT:      Head: Normocephalic and atraumatic.         Eyes: PERLA, EOMI, Conjunctivae are normal. Sclera is non-icteric.       Mouth/Throat: Mucous membranes are moist.       Neck: Supple with no signs of  meningismus. Cardiovascular: Regular rate and rhythm. No murmurs, gallops, or rubs. 2+ symmetrical distal pulses are present . No JVD. No  LE edema Respiratory: Respiratory effort normal .Lungs sounds clear bilaterally. No wheezes, crackles, or rhonchi.  Gastrointestinal: Soft, non tender, non distended. Positive bowel sounds.  Genitourinary: No CVA tenderness. Musculoskeletal: Nontender with normal range of motion in all extremities. No cyanosis, or erythema of extremities. Neurologic:  Face is symmetric. Moving all extremities. No gross focal neurologic  deficits . Skin: Skin is warm, dry.  No rash or ulcers Psychiatric: Mood and affect are appropriate    Labs on Admission: I have personally reviewed following labs and imaging studies  CBC: Recent Labs  Lab 10/05/21 2024  WBC 8.3  NEUTROABS 6.6  HGB 12.7  HCT 39.4  MCV 87.4  PLT 258   Basic Metabolic Panel: Recent Labs  Lab 10/05/21 2024  NA 140  K 4.0  CL 102  CO2 29  GLUCOSE 150*  BUN 33*  CREATININE 1.57*  CALCIUM 9.0   GFR: Estimated Creatinine Clearance: 20.6 mL/min (A) (by C-G formula based on SCr of 1.57 mg/dL (H)). Liver Function Tests: Recent Labs  Lab 10/05/21 2024  AST 14*  ALT 9  ALKPHOS 58  BILITOT 0.9  PROT 6.9  ALBUMIN 3.7   No results for input(s): LIPASE, AMYLASE in the last 168 hours. No results for input(s): AMMONIA in the last 168 hours. Coagulation Profile: No results for input(s): INR, PROTIME in the last 168 hours. Cardiac Enzymes: No results for input(s): CKTOTAL, CKMB, CKMBINDEX, TROPONINI in the last 168 hours. BNP (last 3 results) No results for input(s): PROBNP in the last 8760 hours. HbA1C: No results for input(s): HGBA1C in the last 72 hours. CBG: Recent Labs  Lab 10/05/21 2042  GLUCAP 131*   Lipid Profile: No results for input(s): CHOL, HDL, LDLCALC, TRIG, CHOLHDL, LDLDIRECT in the last 72 hours. Thyroid Function Tests: No results for input(s): TSH, T4TOTAL, FREET4,  T3FREE, THYROIDAB in the last 72 hours. Anemia Panel: No results for input(s): VITAMINB12, FOLATE, FERRITIN, TIBC, IRON, RETICCTPCT in the last 72 hours. Urine analysis:    Component Value Date/Time   COLORURINE YELLOW (A) 01/21/2018 1237   APPEARANCEUR Clear 09/27/2021 1559   LABSPEC 1.013 01/21/2018 1237   LABSPEC 1.013 02/03/2015 1613   PHURINE 5.0 01/21/2018 1237   GLUCOSEU Negative 09/27/2021 1559   GLUCOSEU Negative 02/03/2015 1613   HGBUR SMALL (A) 01/21/2018 1237   BILIRUBINUR Negative 09/27/2021 1559   BILIRUBINUR Negative 02/03/2015 1613   KETONESUR NEGATIVE 01/21/2018 1237   PROTEINUR Negative 09/27/2021 1559   PROTEINUR NEGATIVE 01/21/2018 1237   NITRITE Negative 09/27/2021 1559   NITRITE NEGATIVE 01/21/2018 1237   LEUKOCYTESUR Negative 09/27/2021 1559   LEUKOCYTESUR Negative 02/03/2015 1613    Radiological Exams on Admission: CT Head Wo Contrast  Result Date: 10/05/2021 CLINICAL DATA:  Altered mental status. EXAM: CT HEAD WITHOUT CONTRAST TECHNIQUE: Contiguous axial images were obtained from the base of the skull through the vertex without intravenous contrast. COMPARISON:  July 18, 2018 FINDINGS: Brain: There is moderate severity cerebral atrophy with widening of the extra-axial spaces and ventricular dilatation. There are areas of decreased attenuation within the white matter tracts of the supratentorial brain, consistent with microvascular disease changes. Vascular: No hyperdense vessel or unexpected calcification. Skull: Normal. Negative for fracture or focal lesion. Sinuses/Orbits: No acute finding. Other: None. IMPRESSION: 1. Generalized cerebral atrophy. 2. No acute intracranial abnormality. Electronically Signed   By: Virgina Norfolk M.D.   On: 10/05/2021 21:59   CT ABDOMEN PELVIS W CONTRAST  Result Date: 10/05/2021 CLINICAL DATA:  Abnormal breathing, abdominal pain, stops breathing, underwent CPR by family EXAM: CT ABDOMEN AND PELVIS WITH CONTRAST  TECHNIQUE: Multidetector CT imaging of the abdomen and pelvis was performed using the standard protocol following bolus administration of intravenous contrast. CONTRAST:  13mL OMNIPAQUE IOHEXOL 300 MG/ML SOLN IV. No oral contrast. COMPARISON:  CT abdomen 11/06/2010 FINDINGS: Lower chest: Nonspecific ground-glass opacity RIGHT  lower lobe 20 x 9 mm. Linear subsegmental atelectasis LEFT lower lobe. Remaining lung bases clear. Hepatobiliary: Gallbladder unremarkable. Scattered small hepatic cysts and calcified granulomata. Additional nonspecific linear lucency in medial RIGHT lobe liver question minimal biliary dilatation. Pancreas: Markedly atrophic without mass Spleen: Normal appearance Adrenals/Urinary Tract: Adrenal glands are slightly thickened without focal mass. Cortical thinning and minimal cortical scarring both kidneys. BILATERAL renal cysts. Renal vascular calcifications. Cannot exclude tiny nonobstructing renal calculi. No hydronephrosis, hydroureter or ureteral calcification. Bladder unremarkable. Stomach/Bowel: Sigmoid diverticulosis without evidence of diverticulitis. Large and small bowel loops otherwise normal appearance. Appendix not visualized. Gastric wall appears minimally prominent especially at cardia though stomach is underdistended, question artifact. Vascular/Lymphatic: Atherosclerotic calcifications aorta, iliac arteries, coronary arteries. Aorta normal caliber. No adenopathy. Reproductive: Unremarkable uterus and ovaries Other: No free air or free fluid. No inflammatory process. Tiny supraumbilical hernia containing fat. Musculoskeletal: Bones demineralized. IMPRESSION: Sigmoid diverticulosis without evidence of diverticulitis. Hepatic and renal cysts. Questionable minimal biliary dilatation of the medial RIGHT lobe liver, of uncertain etiology; recommend correlation with LFTs. Nonspecific ground-glass opacity RIGHT lower lobe 20 x 9 mm; if clinically indicated based on patient age and  comorbidities, follow-up CT imaging recommended in 6 months to determine persistence. No acute intra-abdominal or intrapelvic process. Aortic Atherosclerosis (ICD10-I70.0). Electronically Signed   By: Lavonia Dana M.D.   On: 10/05/2021 22:05   DG Chest Portable 1 View  Result Date: 10/05/2021 CLINICAL DATA:  Status post CPR. EXAM: PORTABLE CHEST 1 VIEW COMPARISON:  Chest radiograph dated 11/18/2017. FINDINGS: No focal consolidation, pleural effusion, or pneumothorax. The cardiac silhouette is within normal limits. No acute osseous pathology. Degenerative changes of the spine and shoulders. IMPRESSION: No active disease. Electronically Signed   By: Anner Crete M.D.   On: 10/05/2021 20:55    Assessment/Plan  Syncope History of TIA -Etiology uncertain.  Patient back to baseline - Head CT negative.  No stigmata of infection, first troponin negative - Continuous cardiac monitoring to evaluate for arrhythmias, echocardiogram to evaluate for structural abnormalities given history of childhood rheumatic fever, carotid Doppler given history of prior carotid endarterectomy and TIAs - Neurologic checks - Fall and aspiration precautions    Essential hypertension, benign - Continue home bisoprolol    Acquired hypothyroidism - Continue levothyroxine    Late onset Alzheimer's disease without behavioral disturbance (HCC) - Delirium precautions - Continue Namenda and Seroquel      Diabetes mellitus, type II ([place]) - Sliding scale insulin coverage     DVT prophylaxis: Lovenox  Code Status: full code  Family Communication:  son and daughters at bedside,.  Extensive discussion with Cindee Mclester who is also [place] POA.  Discussed plan of care and goals of care.  He verbalized understanding.  We discussed CODE STATUS.  He feels like his mother will not want to be resuscitated based on prior conversations but he would like to discuss that with his sisters first.   Disposition Plan: Back to previous home  environment Consults called: none  Status: Observation   Athena Masse MD [facility]   10/05/2021, 11:38 PM

## 2021-10-06 ENCOUNTER — Observation Stay: Payer: Medicare Other

## 2021-10-06 ENCOUNTER — Observation Stay
Admit: 2021-10-06 | Discharge: 2021-10-06 | Disposition: A | Discharge status: Home / Self Care | Payer: Medicare Other | Attending: Internal Medicine | Admitting: Internal Medicine

## 2021-10-06 DIAGNOSIS — Z8673 Personal history of transient ischemic attack (TIA), and cerebral infarction without residual deficits: Secondary | ICD-10-CM | POA: Diagnosis not present

## 2021-10-06 DIAGNOSIS — E119 Type 2 diabetes mellitus without complications: Secondary | ICD-10-CM | POA: Diagnosis not present

## 2021-10-06 DIAGNOSIS — G319 Degenerative disease of nervous system, unspecified: Secondary | ICD-10-CM | POA: Diagnosis not present

## 2021-10-06 DIAGNOSIS — I10 Essential (primary) hypertension: Secondary | ICD-10-CM | POA: Diagnosis not present

## 2021-10-06 DIAGNOSIS — R55 Syncope and collapse: Secondary | ICD-10-CM | POA: Diagnosis not present

## 2021-10-06 LAB — URINALYSIS, ROUTINE W REFLEX MICROSCOPIC
Bilirubin Urine: NEGATIVE
Glucose, UA: NEGATIVE mg/dL
Ketones, ur: NEGATIVE mg/dL
Leukocytes,Ua: NEGATIVE
Nitrite: NEGATIVE
Protein, ur: NEGATIVE mg/dL
Specific Gravity, Urine: 1.015 (ref 1.005–1.030)
pH: 7.5 (ref 5.0–8.0)

## 2021-10-06 LAB — URINALYSIS, MICROSCOPIC (REFLEX)
Bacteria, UA: NONE SEEN
WBC, UA: NONE SEEN WBC/hpf (ref 0–5)

## 2021-10-06 LAB — ECHOCARDIOGRAM COMPLETE
AR max vel: 2.31 cm2
AV Area VTI: 2.7 cm2
AV Area mean vel: 2.14 cm2
AV Mean grad: 2 mmHg
AV Peak grad: 4.2 mmHg
Ao pk vel: 1.02 m/s
Area-P 1/2: 3.15 cm2
Height: 64 in
MV VTI: 1.69 cm2
S' Lateral: 2.2 cm
Weight: 1935.99 oz

## 2021-10-06 LAB — TROPONIN I (HIGH SENSITIVITY): Troponin I (High Sensitivity): 10 ng/L (ref <18)

## 2021-10-06 LAB — CBG MONITORING, ED
Glucose-Capillary: 116 mg/dL — ABNORMAL HIGH (ref 70–99)
Glucose-Capillary: 117 mg/dL — ABNORMAL HIGH (ref 70–99)
Glucose-Capillary: 120 mg/dL — ABNORMAL HIGH (ref 70–99)

## 2021-10-06 LAB — LACTIC ACID, PLASMA: Lactic Acid, Venous: 1.6 mmol/L (ref 0.5–1.9)

## 2021-10-06 MED ORDER — HYDROXYZINE HCL 10 MG PO TABS
10.0000 mg | ORAL_TABLET | Freq: Three times a day (TID) | ORAL | Status: DC | PRN
  Administered 2021-10-06: 10 mg via ORAL
  Filled 2021-10-06 (×3): qty 1

## 2021-10-06 NOTE — ED Notes (Signed)
Pt transported to US

## 2021-10-06 NOTE — Discharge Summary (Signed)
Physician Discharge Summary  Samantha Franco AJO:[id number] DOB: 1929-02-25 DOA: 10/05/2021  PCP: Lavera Guise, MD  Admit date: 10/05/2021 Discharge date: 10/06/2021  Admitted From: home Disposition:  home  Recommendations for Outpatient Follow-up:  Follow up with PCP in 1-2 weeks  Home Health: PT Equipment/Devices: none  Discharge Condition: stable CODE STATUS: Full code Diet recommendation: regular  HPI: Per admitting MD, Samantha Franco is a 87 y.o. female with medical history significant for HTN, DM, dementia, childhood rheumatic fever, left carotid endarterectomy, history of TIA who was brought to the ER by EMS following a syncopal episode while  in the back of a car.  According to the family they tried to wake patient but she was unresponsive and they did not feel a pulse and began CPR.    By arrival of EMS patient had a pulse but was breathing abnormally though with normal saturations and eventually started coming around.  History is limited due to dementia but several family members at bedside.   Patient was previously in her usual state of health and celebrated Thanksgiving with her family the day prior.  On the morning of arrival she ate less than her usual amount of breakfast and was a little less active than her baseline.By arrival she appeared back to baseline according to the family at [place] / Discharge diagnoses: Principal problem Syncope -patient was admitted to the hospital with a syncopal episode.  She was sitting in the back of her car, and family tells me that she appeared to be sleeping for about 10 minutes, and when they arrived at the destination they are trying to wake her up but could not.  They tried a radial pulse and could not feel it, they called 911 and started CPR.  Throughout all this, patient appeared to be breathing on her own.  EKG on admission showed sinus rhythm without significant abnormalities, telemetry did not show any  significant arrhythmias.  Due to some reported balance issues by the daughter prior to this episode she underwent an MRI of the brain which was negative for acute intracranial abnormalities.  Carotid ultrasound looks fairly unremarkable, and a 2D echo showed an EF of 60-65%, no WMA, grade 2 diastolic dysfunction and normal RV.  She worked with PT and home health PT was recommended.  She is back to baseline and will be discharged home in stable condition with outpatient follow-up.  Active problems Essential hypertension-continue home medications Hypothyroidism-continue Synthroid Alzheimer's disease-stable, no behavioral disturbances, continue home medications Type 2 diabetes mellitus-no reported hypoglycemic episodes, continue home regimen CKD 3A-creatinine 1.5, most recent creatinine in October 2021 was 1.4, suspect at baseline and shows gradual progression  Sepsis ruled out   Discharge Instructions   Allergies as of 10/06/2021       Reactions   Sulfa Antibiotics    Vasotec [enalapril] Swelling        Medication List     TAKE these medications    Accu-Chek Aviva Plus test strip Generic drug: glucose blood USE 1 STRIP TO CHECK GLUCOSE TWICE DAILY diag E11.65   allopurinol 100 MG tablet Commonly known as: ZYLOPRIM TAKE 1 & 1/2 (ONE & ONE-HALF) TABLETS BY MOUTH NIGHTLY FOR  GOUT What changed:  how much to take how to take this when to take this reasons to take this additional instructions   ALPRAZolam 0.25 MG tablet Commonly known as: XANAX Take 1 tablet (0.25 mg total) by mouth at bedtime as needed for anxiety  or sleep.   aspirin 325 MG tablet Take 325 mg by mouth daily.   BD Pen Needle Nano 2nd Gen 32G X 4 MM Misc Generic drug: Insulin Pen Needle Use as directed with insulin   bisoprolol-hydrochlorothiazide 5-6.25 MG tablet Commonly known as: ZIAC Take 1 tablet by mouth once daily   Continuous Blood Gluc Sensor Misc Use as directed every 14 days. May dispense  FreeStyle [employer] or similar. diag E11.65   docusate sodium 100 MG capsule Commonly known as: COLACE Take 100 mg by mouth at bedtime.   FreeStyle Manning 2 [employer] Use as directed DX E11.65   Levemir FlexTouch 100 UNIT/ML FlexPen Generic drug: insulin detemir Inject 12 Units into the skin at bedtime.   levothyroxine 75 MCG tablet Commonly known as: Euthyrox Take 1 tablet (75 mcg total) by mouth daily before breakfast.   memantine 5 MG tablet Commonly known as: NAMENDA Take 2 tablets (10 mg total) by mouth 2 (two) times daily.   predniSONE 10 MG tablet Commonly known as: DELTASONE Take one tablet by mouth 3 x day for 3 days, then take one tab 2 x day for 3 days, and then take one tab a day for 3 days   QUEtiapine 100 MG tablet Commonly known as: SEROquel Take 1 tablet (100 mg total) by mouth at bedtime. What changed: how much to take   terbinafine 1 % cream Commonly known as: LAMISIL Apply 1 application topically 2 (two) times daily.   traMADol 50 MG tablet Commonly known as: ULTRAM Take 1 tablet (50 mg total) by mouth every 8 (eight) hours as needed for moderate pain.        Consultations: none  Procedures/Studies: none  CT Head Wo Contrast  Result Date: 10/05/2021 CLINICAL DATA:  Altered mental status. EXAM: CT HEAD WITHOUT CONTRAST TECHNIQUE: Contiguous axial images were obtained from the base of the skull through the vertex without intravenous contrast. COMPARISON:  July 18, 2018 FINDINGS: Brain: There is moderate severity cerebral atrophy with widening of the extra-axial spaces and ventricular dilatation. There are areas of decreased attenuation within the white matter tracts of the supratentorial brain, consistent with microvascular disease changes. Vascular: No hyperdense vessel or unexpected calcification. Skull: Normal. Negative for fracture or focal lesion. Sinuses/Orbits: No acute finding. Other: None. IMPRESSION: 1. Generalized cerebral  atrophy. 2. No acute intracranial abnormality. Electronically Signed   By: Virgina Norfolk M.D.   On: 10/05/2021 21:59   MR BRAIN WO CONTRAST  Result Date: 10/06/2021 CLINICAL DATA:  Transient ischemic attack. EXAM: MRI HEAD WITHOUT CONTRAST TECHNIQUE: Multiplanar, multiecho pulse sequences of the brain and surrounding structures were obtained without intravenous contrast. COMPARISON:  Head CT 10/05/2021.  Brain MRI 03/10/2017. FINDINGS: Brain: Intermittently motion degraded examination, limiting evaluation. Most notably, there is moderate motion degradation of the sagittal T1 weighted sequence, moderate motion of the axial T1 weighted sequence and moderate/severe motion a shin of the coronal T2 TSE sequence. Mild generalized cerebral and cerebellar atrophy. Mild multifocal T2 FLAIR hyperintense signal abnormality within the cerebral white matter and pons, nonspecific but compatible with chronic small vessel ischemic disease. Redemonstrated small chronic infarcts within the bilateral cerebellar hemispheres. There is no acute infarct. No evidence of an intracranial mass. No chronic intracranial blood products. No extra-axial fluid collection. No midline shift. Vascular: Maintained flow voids within the proximal large arterial vessels. Skull and upper cervical spine: No focal suspicious marrow lesion. Completely assessed cervical spondylosis. Trace C2-C3 grade 1 anterolisthesis Sinuses/Orbits: Visualized orbits show  no acute finding. No significant paranasal sinus disease. IMPRESSION: Motion degraded examination, as described. No evidence of acute intracranial abnormality. Mild chronic small vessel ischemic changes within the cerebral white matter and pons. Redemonstrated small chronic infarcts within the bilateral cerebellar hemispheres. Mild generalized cerebral and cerebellar atrophy. Electronically Signed   By: Kellie Simmering D.O.   On: 10/06/2021 12:53   CT ABDOMEN PELVIS W CONTRAST  Result Date:  10/05/2021 CLINICAL DATA:  Abnormal breathing, abdominal pain, stops breathing, underwent CPR by family EXAM: CT ABDOMEN AND PELVIS WITH CONTRAST TECHNIQUE: Multidetector CT imaging of the abdomen and pelvis was performed using the standard protocol following bolus administration of intravenous contrast. CONTRAST:  67mL OMNIPAQUE IOHEXOL 300 MG/ML SOLN IV. No oral contrast. COMPARISON:  CT abdomen 11/06/2010 FINDINGS: Lower chest: Nonspecific ground-glass opacity RIGHT lower lobe 20 x 9 mm. Linear subsegmental atelectasis LEFT lower lobe. Remaining lung bases clear. Hepatobiliary: Gallbladder unremarkable. Scattered small hepatic cysts and calcified granulomata. Additional nonspecific linear lucency in medial RIGHT lobe liver question minimal biliary dilatation. Pancreas: Markedly atrophic without mass Spleen: Normal appearance Adrenals/Urinary Tract: Adrenal glands are slightly thickened without focal mass. Cortical thinning and minimal cortical scarring both kidneys. BILATERAL renal cysts. Renal vascular calcifications. Cannot exclude tiny nonobstructing renal calculi. No hydronephrosis, hydroureter or ureteral calcification. Bladder unremarkable. Stomach/Bowel: Sigmoid diverticulosis without evidence of diverticulitis. Large and small bowel loops otherwise normal appearance. Appendix not visualized. Gastric wall appears minimally prominent especially at cardia though stomach is underdistended, question artifact. Vascular/Lymphatic: Atherosclerotic calcifications aorta, iliac arteries, coronary arteries. Aorta normal caliber. No adenopathy. Reproductive: Unremarkable uterus and ovaries Other: No free air or free fluid. No inflammatory process. Tiny supraumbilical hernia containing fat. Musculoskeletal: Bones demineralized. IMPRESSION: Sigmoid diverticulosis without evidence of diverticulitis. Hepatic and renal cysts. Questionable minimal biliary dilatation of the medial RIGHT lobe liver, of uncertain etiology;  recommend correlation with LFTs. Nonspecific ground-glass opacity RIGHT lower lobe 20 x 9 mm; if clinically indicated based on patient age and comorbidities, follow-up CT imaging recommended in 6 months to determine persistence. No acute intra-abdominal or intrapelvic process. Aortic Atherosclerosis (ICD10-I70.0). Electronically Signed   By: Lavonia Dana M.D.   On: 10/05/2021 22:05   US Carotid Bilateral  Result Date: 10/06/2021 CLINICAL DATA:  Syncope, collapse. Hypertension, stroke/TIA, diabetes. Left endarterectomy. EXAM: BILATERAL CAROTID DUPLEX ULTRASOUND TECHNIQUE: Pearline Cables scale imaging, color Doppler and duplex ultrasound were performed of bilateral carotid and vertebral arteries in the neck. COMPARISON:  CT 11/20/2016 and previous FINDINGS: Criteria: Quantification of carotid stenosis is based on velocity parameters that correlate the residual internal carotid diameter with NASCET-based stenosis levels, using the diameter of the distal internal carotid lumen as the denominator for stenosis measurement. The following velocity measurements were obtained: RIGHT ICA: 63/13 cm/sec CCA: 36/6 cm/sec SYSTOLIC ICA/CCA RATIO:  1.2 ECA: 60 cm/sec LEFT ICA: 84/12 cm/sec CCA: 44/03 cm/sec SYSTOLIC ICA/CCA RATIO:  1.4 ECA: 59 cm/sec RIGHT CAROTID ARTERY: Smooth partially calcified plaque in the common carotid artery with only mild stenosis. No significant plaque in the bulb. Normal waveforms and color Doppler signal throughout. RIGHT VERTEBRAL ARTERY:  Normal flow direction and waveform. LEFT CAROTID ARTERY: Eccentric plaque in the common carotid artery and bulb with only mild stenosis. Normal waveforms and color Doppler signal. LEFT VERTEBRAL ARTERY:  Normal flow direction and waveform. IMPRESSION: 1. Right common carotid artery plaque without significant stenosis. 2. No evidence of residual/recurrent stenosis post left carotid endarterectomy. 3.  Antegrade bilateral vertebral arterial flow. Electronically Signed   By:  Keturah Barre  Vernard Gambles M.D.   On: 10/06/2021 11:50   DG Chest Portable 1 View  Result Date: 10/05/2021 CLINICAL DATA:  Status post CPR. EXAM: PORTABLE CHEST 1 VIEW COMPARISON:  Chest radiograph dated 11/18/2017. FINDINGS: No focal consolidation, pleural effusion, or pneumothorax. The cardiac silhouette is within normal limits. No acute osseous pathology. Degenerative changes of the spine and shoulders. IMPRESSION: No active disease. Electronically Signed   By: Anner Crete M.D.   On: 10/05/2021 20:55   ECHOCARDIOGRAM COMPLETE  Result Date: 10/06/2021    ECHOCARDIOGRAM REPORT   Patient Name:   SINAHI KNIGHTS Date of Exam: 10/06/2021 Medical Rec #:  [id number]        Height:       64.0 in Accession #:    [id number]       Weight:       121.0 lb Date of Birth:  [birth year]        BSA:          1.580 m Patient Age:    52 years         BP:           171/69 mmHg Patient Gender: F                HR:           68 bpm. Exam Location:  [facility] Procedure: 2D Echo, Cardiac Doppler and Color Doppler Indications:     Syncope R55  History:         Patient has no prior history of Echocardiogram examinations.                  TIA and Stroke; Risk Factors:Hypertension.  Sonographer:     Sherrie Sport Referring Phys:  [id number] Athena Masse Diagnosing Phys: [place]  1. Left ventricular ejection fraction, by estimation, is 60 to 65%. The left ventricle has normal function. The left ventricle has no regional wall motion abnormalities. Left ventricular diastolic parameters are consistent with Grade II diastolic dysfunction (pseudonormalization).  2. Right ventricular systolic function is normal. The right ventricular size is normal.  3. Left atrial size was mild to moderately dilated.  4. Right atrial size was mildly dilated.  5. The mitral valve is normal in structure. Mild mitral valve regurgitation. No evidence of mitral stenosis.  6. The aortic valve is normal in structure. Aortic valve regurgitation is mild. No aortic  stenosis is present.  7. The inferior vena cava is normal in size with greater than 50% respiratory variability, suggesting right atrial pressure of 3 mmHg. FINDINGS  Left Ventricle: Left ventricular ejection fraction, by estimation, is 60 to 65%. The left ventricle has normal function. The left ventricle has no regional wall motion abnormalities. The left ventricular internal cavity size was normal in size. There is  no left ventricular hypertrophy. Left ventricular diastolic parameters are consistent with Grade II diastolic dysfunction (pseudonormalization). Right Ventricle: The right ventricular size is normal. No increase in right ventricular wall thickness. Right ventricular systolic function is normal. Left Atrium: Left atrial size was mild to moderately dilated. Right Atrium: Right atrial size was mildly dilated. Pericardium: There is no evidence of pericardial effusion. Mitral Valve: The mitral valve is normal in structure. Mild mitral valve regurgitation. No evidence of mitral valve stenosis. MV peak gradient, 6.4 mmHg. The mean mitral valve gradient is 3.0 mmHg. Tricuspid Valve: The tricuspid valve is normal in structure. Tricuspid valve regurgitation is mild . No evidence of tricuspid stenosis. Aortic Valve:  The aortic valve is normal in structure. Aortic valve regurgitation is mild. No aortic stenosis is present. Aortic valve mean gradient measures 2.0 mmHg. Aortic valve peak gradient measures 4.2 mmHg. Aortic valve area, by VTI measures 2.70 cm. Pulmonic Valve: The pulmonic valve was normal in structure. Pulmonic valve regurgitation is trivial. No evidence of pulmonic stenosis. Aorta: The aortic root is normal in size and structure. Venous: The inferior vena cava is normal in size with greater than 50% respiratory variability, suggesting right atrial pressure of 3 mmHg. IAS/Shunts: No atrial level shunt detected by color flow Doppler.  LEFT VENTRICLE PLAX 2D LVIDd:         3.90 cm   Diastology LVIDs:          2.20 cm   LV e' medial:    4.35 cm/s LV PW:         1.00 cm   LV E/e' medial:  24.4 LV IVS:        0.85 cm   LV e' lateral:   6.96 cm/s LVOT diam:     2.00 cm   LV E/e' lateral: 15.2 LV SV:         59 LV SV Index:   37 LVOT Area:     3.14 cm  RIGHT VENTRICLE RV Basal diam:  3.50 cm RV S prime:     11.00 cm/s TAPSE (M-mode): 5.0 cm LEFT ATRIUM             Index        RIGHT ATRIUM           Index LA diam:        4.10 cm 2.59 cm/m   RA Area:     25.00 cm LA Vol (A2C):   87.3 ml 55.25 ml/m  RA Volume:   80.90 ml  51.20 ml/m LA Vol (A4C):   58.4 ml 36.96 ml/m LA Biplane Vol: 75.4 ml 47.72 ml/m  AORTIC VALVE                    PULMONIC VALVE AV Area (Vmax):    2.31 cm     PV Vmax:        0.62 m/s AV Area (Vmean):   2.14 cm     PV Vmean:       41.800 cm/s AV Area (VTI):     2.70 cm     PV VTI:         0.122 m AV Vmax:           102.00 cm/s  PV Peak grad:   1.6 mmHg AV Vmean:          69.900 cm/s  PV Mean grad:   1.0 mmHg AV VTI:            0.219 m      RVOT Peak grad: 2 mmHg AV Peak Grad:      4.2 mmHg AV Mean Grad:      2.0 mmHg LVOT Vmax:         75.10 cm/s LVOT Vmean:        47.600 cm/s LVOT VTI:          0.188 m LVOT/AV VTI ratio: 0.86  AORTA Ao Root diam: 3.07 cm MITRAL VALVE                TRICUSPID VALVE MV Area (PHT): 3.15 cm     TR Peak grad:   17.0 mmHg MV Area VTI:  1.69 cm     TR Vmax:        206.00 cm/s MV Peak grad:  6.4 mmHg MV Mean grad:  3.0 mmHg     SHUNTS MV Vmax:       1.26 m/s     Systemic VTI:  0.19 m MV Vmean:      75.7 cm/s    Systemic Diam: 2.00 cm MV Decel Time: 241 msec     Pulmonic VTI:  0.132 m MV E velocity: 106.00 cm/s MV A velocity: 90.80 cm/s MV E/A ratio:  1.17 Shaukat Khan Electronically signed by Neoma Laming Signature Date/Time: 10/06/2021/3:48:41 PM    Final      Subjective: - no chest pain, shortness of breath, no abdominal pain, nausea or vomiting.   Discharge Exam: BP (!) 171/69   Pulse 68   Temp 98.1 F (36.7 C) (Oral)   Resp 15   Ht 5\' 4"  (1.626 m)    Wt 54.9 kg   SpO2 95%   BMI 20.77 kg/m   General: Pt is alert, awake, not in acute distress Cardiovascular: RRR, S1/S2 +, no rubs, no gallops Respiratory: CTA bilaterally, no wheezing, no rhonchi Abdominal: Soft, NT, ND, bowel sounds + Extremities: no edema, no cyanosis   The results of significant diagnostics from this hospitalization (including imaging, microbiology, ancillary and laboratory) are listed below for reference.     Microbiology: Recent Results (from the past 240 hour(s))  Microscopic Examination     Status: Abnormal   Collection Time: 09/27/21  3:59 PM   Urine  Result Value Ref Range Status   WBC, UA 0-5 0 - 5 /hpf Final   RBC None seen 0 - 2 /hpf Final   Epithelial Cells (non renal) >10 (A) 0 - 10 /hpf Final   Casts None seen None seen /lpf Final   Bacteria, UA Few None seen/Few Final  Resp Panel by RT-PCR (Flu A&B, Covid) Nasopharyngeal Swab     Status: None   Collection Time: 10/05/21  8:24 PM   Specimen: Nasopharyngeal Swab; Nasopharyngeal(NP) swabs in vial transport medium  Result Value Ref Range Status   SARS Coronavirus 2 by RT PCR NEGATIVE NEGATIVE Final    Comment: (NOTE) SARS-CoV-2 target nucleic acids are NOT DETECTED.  The SARS-CoV-2 RNA is generally detectable in upper respiratory specimens during the acute phase of infection. The lowest concentration of SARS-CoV-2 viral copies this assay can detect is 138 copies/mL. A negative result does not preclude SARS-Cov-2 infection and should not be used as the sole basis for treatment or other patient management decisions. A negative result may occur with  improper specimen collection/handling, submission of specimen other than nasopharyngeal swab, presence of viral mutation(s) within the areas targeted by this assay, and inadequate number of viral copies(<138 copies/mL). A negative result must be combined with clinical observations, patient history, and epidemiological information. The expected  result is Negative.  Fact Sheet for Patients:  EntrepreneurPulse.com.au  Fact Sheet for Healthcare Providers:  IncredibleEmployment.be  This test is no t yet approved or cleared by the [place] FDA and  has been authorized for detection and/or diagnosis of SARS-CoV-2 by FDA under an Emergency Use Authorization (EUA). This EUA will remain  in effect (meaning this test can be used) for the duration of the COVID-19 declaration under Section 564(b)(1) of the Act, 21 U.S.C.section 360bbb-3(b)(1), unless the authorization is terminated  or revoked sooner.       Influenza A by PCR NEGATIVE NEGATIVE Final  Influenza B by PCR NEGATIVE NEGATIVE Final    Comment: (NOTE) The Xpert Xpress SARS-CoV-2/FLU/RSV plus assay is intended as an aid in the diagnosis of influenza from Nasopharyngeal swab specimens and should not be used as a sole basis for treatment. Nasal washings and aspirates are unacceptable for Xpert Xpress SARS-CoV-2/FLU/RSV testing.  Fact Sheet for Patients: EntrepreneurPulse.com.au  Fact Sheet for Healthcare Providers: IncredibleEmployment.be  This test is not yet approved or cleared by the [place] FDA and has been authorized for detection and/or diagnosis of SARS-CoV-2 by FDA under an Emergency Use Authorization (EUA). This EUA will remain in effect (meaning this test can be used) for the duration of the COVID-19 declaration under Section 564(b)(1) of the Act, 21 U.S.C. section 360bbb-3(b)(1), unless the authorization is terminated or revoked.  Performed at [facility], [address] [place], [place] [address]      Labs: Basic Metabolic Panel: Recent Labs  Lab 10/05/21 2024  NA 140  K 4.0  CL 102  CO2 29  GLUCOSE 150*  BUN 33*  CREATININE 1.57*  CALCIUM 9.0   Liver Function Tests: Recent Labs  Lab 10/05/21 2024  AST 14*  ALT 9  ALKPHOS 58  BILITOT 0.9   PROT 6.9  ALBUMIN 3.7   CBC: Recent Labs  Lab 10/05/21 2024  WBC 8.3  NEUTROABS 6.6  HGB 12.7  HCT 39.4  MCV 87.4  PLT 193   CBG: Recent Labs  Lab 10/05/21 2042 10/06/21 0028 10/06/21 0634 10/06/21 1134  GLUCAP 131* 117* 116* 120*   Hgb A1c No results for input(s): HGBA1C in the last 72 hours. Lipid Profile No results for input(s): CHOL, HDL, LDLCALC, TRIG, CHOLHDL, LDLDIRECT in the last 72 hours. Thyroid function studies No results for input(s): TSH, T4TOTAL, T3FREE, THYROIDAB in the last 72 hours.  Invalid input(s): FREET3 Urinalysis    Component Value Date/Time   COLORURINE YELLOW 10/05/2021 [place] 10/05/2021 2224   APPEARANCEUR Clear 09/27/2021 1559   LABSPEC 1.015 10/05/2021 2224   LABSPEC 1.013 02/03/2015 1613   PHURINE 7.5 10/05/2021 2224   GLUCOSEU NEGATIVE 10/05/2021 2224   GLUCOSEU Negative 02/03/2015 1613   HGBUR TRACE (A) 10/05/2021 2224   BILIRUBINUR NEGATIVE 10/05/2021 2224   BILIRUBINUR Negative 09/27/2021 1559   BILIRUBINUR Negative 02/03/2015 1613   KETONESUR NEGATIVE 10/05/2021 2224   PROTEINUR NEGATIVE 10/05/2021 2224   NITRITE NEGATIVE 10/05/2021 2224   LEUKOCYTESUR NEGATIVE 10/05/2021 2224   LEUKOCYTESUR Negative 02/03/2015 1613    FURTHER DISCHARGE INSTRUCTIONS:   Get Medicines reviewed and adjusted: Please take all your medications with you for your next visit with your Primary MD   Laboratory/radiological data: Please request your Primary MD to go over all hospital tests and procedure/radiological results at the follow up, please ask your Primary MD to get all Hospital records sent to his/her office.   In some cases, they will be blood work, cultures and biopsy results pending at the time of your discharge. Please request that your primary care M.D. goes through all the records of your hospital data and follows up on these results.   Also Note the following: If you experience worsening of your admission  symptoms, develop shortness of breath, life threatening emergency, suicidal or homicidal thoughts you must seek medical attention immediately by calling 911 or calling your MD immediately  if symptoms less severe.   You must read complete instructions/literature along with all the possible adverse reactions/side effects for all the Medicines you take and  that have been prescribed to you. Take any new Medicines after you have completely understood and accpet all the possible adverse reactions/side effects.    Do not drive when taking Pain medications or sleeping medications (Benzodaizepines)   Do not take more than prescribed Pain, Sleep and Anxiety Medications. It is not advisable to combine anxiety,sleep and pain medications without talking with your primary care practitioner   Special Instructions: If you have smoked or chewed Tobacco  in the last 2 yrs please stop smoking, stop any regular Alcohol  and or any Recreational drug use.   Wear Seat belts while driving.   Please note: You were cared for by a hospitalist during your hospital stay. Once you are discharged, your primary care physician will handle any further medical issues. Please note that NO REFILLS for any discharge medications will be authorized once you are discharged, as it is imperative that you return to your primary care physician (or establish a relationship with a primary care physician if you do not have one) for your post hospital discharge needs so that they can reassess your need for medications and monitor your lab values.  Time coordinating discharge: 40 minutes  SIGNED:  Marzetta Board, MD, PhD 10/06/2021, 4:09 PM

## 2021-10-06 NOTE — Evaluation (Addendum)
Physical Therapy Evaluation Patient Details Name: Samantha Franco MRN: [id number] DOB: 22-Jun-1929 Today's Date: 10/06/2021  History of Present Illness  Pt is a 87 y/o F who presented to the ED on 10/05/21. Pt was riding in the car with her family when they noticed she stopped breathing & could not find a pulse & began CPR. EMS arrived & observed abnormal breathing then pt woke up. CT showed no acute intracranial abnormality. PMH: mild dementia, arthritis, chorea, COPD, DM, GERD, rheumatic fever, HLD, HTN, hypothyroidism, kidney disease, lung CA, stroke, vertigo   Clinical Impression  Pt seen for PT evaluation with daughter Gwinda Passe) present for session. Pt reports need to use restroom & completes bed mobility with supervision, ambulates to toilet & back with min assist with pt endorsing lightheadedness that improves. After continent void & hand hygiene pt returned to bed. Orthostatic vital signs were then obtained, with pt endorsing lightheadedness that worsens with standing on this attempt so pt assisted back to bed. Will continue to follow pt acutely to progress gait, stair negotiation, & balance.  BP checked in RUE: Supine: 176/83 mmHg (MAP 109) Sitting: 152/122 mmHg (MAP 132) Pt transferred to standing but unable to tolerate long enough to obtain standing BP so sitting EOB: 172/82 mmHg (MAP 103) Pt on room air with lowest SpO2 of 87% but pt able to recover to >/= 90%.      Recommendations for follow up therapy are one component of a multi-disciplinary discharge planning process, led by the attending physician.  Recommendations may be updated based on patient status, additional functional criteria and insurance authorization.  Follow Up Recommendations Home health PT    Assistance Recommended at Discharge Frequent or constant Supervision/Assistance  Functional Status Assessment  (minimal change in functional status)  Equipment Recommendations  Rolling walker (2 wheels);BSC/3in1     Recommendations for Other Services       Precautions / Restrictions Precautions Precautions: Fall Restrictions Weight Bearing Restrictions: No      Mobility  Bed Mobility Overal bed mobility: Needs Assistance Bed Mobility: Sit to Supine;Supine to Sit     Supine to sit: Supervision;HOB elevated Sit to supine: Supervision        Transfers Overall transfer level: Needs assistance Equipment used: None Transfers: Sit to/from Stand Sit to Stand: Min guard                Ambulation/Gait Ambulation/Gait assistance: Min assist Gait Distance (Feet):  (10 ft + 10 ft) Assistive device: None Gait Pattern/deviations: Decreased step length - left;Decreased step length - right;Decreased stride length Gait velocity: silghtly decreased        Stairs            Wheelchair Mobility    Modified Rankin (Stroke Patients Only)       Balance Overall balance assessment: Needs assistance Sitting-balance support: Feet supported;Bilateral upper extremity supported Sitting balance-Leahy Scale: Good Sitting balance - Comments: supervision static sitting   Standing balance support: Single extremity supported;During functional activity Standing balance-Leahy Scale: Fair                               Pertinent Vitals/Pain Pain Assessment: No/denies pain    Home Living Family/patient expects to be discharged to:: Private residence Living Arrangements: Other (Comment) (personal care aides) Available Help at Discharge: Available 24 hours/day (personal care aides) Type of Home: House Home Access: Stairs to enter   [employer] of Steps: 2 steps with  B rails onto deck then 1 step into house   Home Layout: One level        Prior Function               Mobility Comments: Pt was able to ambulate around home & negotiate stairs with supervision.       Hand Dominance        Extremity/Trunk Assessment   Upper Extremity Assessment Upper  Extremity Assessment: Overall WFL for tasks assessed    Lower Extremity Assessment Lower Extremity Assessment: Generalized weakness       Communication      Cognition Arousal/Alertness: Awake/alert Behavior During Therapy: WFL for tasks assessed/performed Overall Cognitive Status: History of cognitive impairments - at baseline                                 General Comments: Pt AxO to self & location but unsure of situation with daughter orienting pt.        General Comments General comments (skin integrity, edema, etc.): Pt with continent void on toilet & performs peri hygiene and hand hygiene without physical assistance.    Exercises     Assessment/Plan    PT Assessment Patient needs continued PT services  PT Problem List Decreased mobility;Decreased safety awareness;Decreased balance;Decreased strength       PT Treatment Interventions DME instruction;Therapeutic exercise;Gait training;Balance training;Stair training;Neuromuscular re-education;Functional mobility training;Patient/family education;Therapeutic activities    PT Goals (Current goals can be found in the Care Plan section)  Acute Rehab PT Goals Patient Stated Goal: figure out what's going on PT Goal Formulation: With patient/family Time For Goal Achievement: 10/20/21 Potential to Achieve Goals: Good    Frequency Min 2X/week   Barriers to discharge        Co-evaluation               AM-PAC PT "6 Clicks" Mobility  Outcome Measure Help needed turning from your back to your side while in a flat bed without using bedrails?: None Help needed moving from lying on your back to sitting on the side of a flat bed without using bedrails?: None Help needed moving to and from a bed to a chair (including a wheelchair)?: A Little Help needed standing up from a chair using your arms (e.g., wheelchair or bedside chair)?: A Little Help needed to walk in hospital room?: A Little Help needed  climbing 3-5 steps with a railing? : A Little 6 Click Score: 20    End of Session   Activity Tolerance: Patient tolerated treatment well Patient left: in bed;with call bell/phone within reach;with family/visitor present   PT Visit Diagnosis: Unsteadiness on feet (R26.81);Muscle weakness (generalized) (M62.81)    Time: 9476-5465 PT Time Calculation (min) (ACUTE ONLY): 14 min   Charges:   PT Evaluation $PT Eval Moderate Complexity: 1 Mod PT Treatments $Therapeutic Activity: 8-22 mins        Lavone Nian, PT, DPT 10/06/21, 1:40 PM   Waunita Schooner 10/06/2021, 1:30 PM

## 2021-10-06 NOTE — ED Notes (Signed)
Pt wheeled to waiting room. Pt verbalized understanding of discharge instructions. Family at bedside also verbalized understanding of D/C instructions.

## 2021-10-06 NOTE — ED Notes (Signed)
Pt transported to MRI at this time 

## 2021-10-06 NOTE — Progress Notes (Signed)
Anticoagulation monitoring(Lovenox):  87 yo female ordered Lovenox 40 mg Q24h    Filed Weights   10/05/21 2015  Weight: 54.9 kg (121 lb)   BMI 20.77    Lab Results  Component Value Date   CREATININE 1.57 (H) 10/05/2021   CREATININE 1.39 (H) 08/17/2020   CREATININE 1.17 (H) 01/21/2018   Estimated Creatinine Clearance: 20.6 mL/min (A) (by C-G formula based on SCr of 1.57 mg/dL (H)). Hemoglobin & Hematocrit     Component Value Date/Time   HGB 12.7 10/05/2021 2024   HGB 11.7 08/17/2020 1527   HCT 39.4 10/05/2021 2024   HCT 36.8 08/17/2020 1527     Per Protocol for Patient with estCrcl < 30 ml/min and BMI < 40, will transition to Lovenox 30 mg Q24h.

## 2021-10-06 NOTE — Progress Notes (Signed)
*  PRELIMINARY RESULTS* Echocardiogram 2D Echocardiogram has been performed.  Samantha Franco 10/06/2021, 2:00 PM

## 2021-10-06 NOTE — TOC Transition Note (Signed)
Transition of Care [facility]) - CM/SW Discharge Note   Patient Details  Name: GLENISHA GUNDRY MRN: [id number] Date of Birth: 1929/06/21  [facility] of [facility]) CM/SW Contact:  Shelbie Hutching, RN Phone Number: 10/06/2021, 4:27 PM   Clinical Narrative:    Patient placed under observation for syncope and collapse.  Patient is doing well and is medically cleared for discharge home.  MD has written discharge orders and [facility] PT orders.  Patietn lives at home by herself but has around the clock hired private caregivers.  Daughter is at the bedside along with other family members.  Patient agrees to home health PT and chooses [facility].  Patient is current with her PCP.  They are pretty sure she has a walker at home somewhere but does not usually use one.  They decline 3 in 1.  Daughter will transport patient home.   No other discharge needs identified.   Final next level of care: [place] Barriers to Discharge: Barriers Resolved   Patient Goals and CMS Choice Patient states their goals for this hospitalization and ongoing recovery are:: Glad to be going home CMS Medicare.gov Compare Post Acute Care list provided to:: Patient Choice offered to / list presented to : Patient, Adult Children  Discharge Placement                       Discharge Plan and Services   Discharge Planning Services: CM Consult Post Acute Care Choice: Home Health          DME Arranged: N/A DME Agency: NA       HH Arranged: PT [place] Agency: [place] ([place]) Date HH Agency Contacted: 10/06/21 Time [place]: 1626 Representative spoke with at [place]: [place] ([place]) Interventions     Readmission Risk Interventions No flowsheet data found.

## 2021-10-08 ENCOUNTER — Telehealth: Payer: Self-pay

## 2021-10-08 NOTE — Telephone Encounter (Signed)
Lvm to set up hospital follow up appointment-Toni

## 2021-10-09 ENCOUNTER — Telehealth: Payer: Self-pay

## 2021-10-09 DIAGNOSIS — Z7982 Long term (current) use of aspirin: Secondary | ICD-10-CM | POA: Diagnosis not present

## 2021-10-09 DIAGNOSIS — E039 Hypothyroidism, unspecified: Secondary | ICD-10-CM | POA: Diagnosis not present

## 2021-10-09 DIAGNOSIS — R9082 White matter disease, unspecified: Secondary | ICD-10-CM | POA: Diagnosis not present

## 2021-10-09 DIAGNOSIS — Z87891 Personal history of nicotine dependence: Secondary | ICD-10-CM | POA: Diagnosis not present

## 2021-10-09 DIAGNOSIS — Z9181 History of falling: Secondary | ICD-10-CM | POA: Diagnosis not present

## 2021-10-09 DIAGNOSIS — N1831 Chronic kidney disease, stage 3a: Secondary | ICD-10-CM | POA: Diagnosis not present

## 2021-10-09 DIAGNOSIS — M4712 Other spondylosis with myelopathy, cervical region: Secondary | ICD-10-CM | POA: Diagnosis not present

## 2021-10-09 DIAGNOSIS — K219 Gastro-esophageal reflux disease without esophagitis: Secondary | ICD-10-CM | POA: Diagnosis not present

## 2021-10-09 DIAGNOSIS — M150 Primary generalized (osteo)arthritis: Secondary | ICD-10-CM | POA: Diagnosis not present

## 2021-10-09 DIAGNOSIS — M7070 Other bursitis of hip, unspecified hip: Secondary | ICD-10-CM | POA: Diagnosis not present

## 2021-10-09 DIAGNOSIS — F411 Generalized anxiety disorder: Secondary | ICD-10-CM | POA: Diagnosis not present

## 2021-10-09 DIAGNOSIS — F0284 Dementia in other diseases classified elsewhere, unspecified severity, with anxiety: Secondary | ICD-10-CM | POA: Diagnosis not present

## 2021-10-09 DIAGNOSIS — Z794 Long term (current) use of insulin: Secondary | ICD-10-CM | POA: Diagnosis not present

## 2021-10-09 DIAGNOSIS — Z85118 Personal history of other malignant neoplasm of bronchus and lung: Secondary | ICD-10-CM | POA: Diagnosis not present

## 2021-10-09 DIAGNOSIS — I1310 Hypertensive heart and chronic kidney disease without heart failure, with stage 1 through stage 4 chronic kidney disease, or unspecified chronic kidney disease: Secondary | ICD-10-CM | POA: Diagnosis not present

## 2021-10-09 DIAGNOSIS — Z87440 Personal history of urinary (tract) infections: Secondary | ICD-10-CM | POA: Diagnosis not present

## 2021-10-09 DIAGNOSIS — G301 Alzheimer's disease with late onset: Secondary | ICD-10-CM | POA: Diagnosis not present

## 2021-10-09 DIAGNOSIS — N3946 Mixed incontinence: Secondary | ICD-10-CM | POA: Diagnosis not present

## 2021-10-09 DIAGNOSIS — E785 Hyperlipidemia, unspecified: Secondary | ICD-10-CM | POA: Diagnosis not present

## 2021-10-09 DIAGNOSIS — Z8673 Personal history of transient ischemic attack (TIA), and cerebral infarction without residual deficits: Secondary | ICD-10-CM | POA: Diagnosis not present

## 2021-10-09 DIAGNOSIS — E1122 Type 2 diabetes mellitus with diabetic chronic kidney disease: Secondary | ICD-10-CM | POA: Diagnosis not present

## 2021-10-09 DIAGNOSIS — G255 Other chorea: Secondary | ICD-10-CM | POA: Diagnosis not present

## 2021-10-09 DIAGNOSIS — J449 Chronic obstructive pulmonary disease, unspecified: Secondary | ICD-10-CM | POA: Diagnosis not present

## 2021-10-09 NOTE — Telephone Encounter (Signed)
[facility], [phone] verbal orders for physical therapy: twice a week for 4 week then once a week for 4 weeks, orders from [place]

## 2021-10-10 ENCOUNTER — Other Ambulatory Visit: Payer: Self-pay | Admitting: Nurse Practitioner

## 2021-10-10 DIAGNOSIS — M5386 Other specified dorsopathies, lumbar region: Secondary | ICD-10-CM

## 2021-10-11 ENCOUNTER — Ambulatory Visit (INDEPENDENT_AMBULATORY_CARE_PROVIDER_SITE_OTHER): Payer: Medicare Other | Admitting: Nurse Practitioner

## 2021-10-11 ENCOUNTER — Encounter: Payer: Self-pay | Admitting: Nurse Practitioner

## 2021-10-11 ENCOUNTER — Other Ambulatory Visit: Payer: Self-pay

## 2021-10-11 VITALS — BP 140/62 | HR 69 | Temp 98.6°F | Resp 16 | Ht 62.0 in | Wt 121.2 lb

## 2021-10-11 DIAGNOSIS — R55 Syncope and collapse: Secondary | ICD-10-CM

## 2021-10-11 DIAGNOSIS — M5386 Other specified dorsopathies, lumbar region: Secondary | ICD-10-CM | POA: Diagnosis not present

## 2021-10-11 MED ORDER — TRAMADOL HCL 50 MG PO TABS: 50.0000 mg | ORAL_TABLET | Freq: Three times a day (TID) | ORAL | 0 refills | Status: DC | PRN

## 2021-10-11 NOTE — Progress Notes (Signed)
[facility] [place], [place] [address]  Internal MEDICINE  Office Visit Note  Patient Name: Samantha Franco  [id number]  [id number]  Date of Service: 11/12/2021  Chief Complaint  Patient presents with   Follow-up    Syncope, discuss meds   Medication Refill   Diabetes   Gastroesophageal Reflux   Hypertension   Hyperlipidemia   COPD    HPI Deiona presents for a follow up visit after a ED visit. Patient was riding in a care and stopped breathing, Patient and family reports that she passed out and did not have a pulse and needed chest compressions. She came back after fam ily members started chest compressions and was doing abdominal breathing when EMS arrived so CPR was stopped. Patient does not remember any of the events. She was feeling well when she came around but did have some abdominal pain and tenderness with palpation.      Current Medication: Outpatient Encounter Medications as of 10/11/2021  Medication Sig   allopurinol (ZYLOPRIM) 100 MG tablet TAKE 1 & 1/2 (ONE & ONE-HALF) TABLETS BY MOUTH NIGHTLY FOR  GOUT (Patient taking differently: Take 150 mg by mouth at bedtime as needed (gout symptoms).)   ALPRAZolam (XANAX) 0.25 MG tablet Take 1 tablet (0.25 mg total) by mouth at bedtime as needed for anxiety or sleep.   aspirin 325 MG tablet Take 325 mg by mouth daily.   bisoprolol-hydrochlorothiazide (ZIAC) 5-6.25 MG tablet Take 1 tablet by mouth once daily   Continuous Blood Gluc Receiver (FREESTYLE LIBRE 2 READER) DEVI Use as directed DX E11.65   Continuous Blood Gluc Sensor MISC Use as directed every 14 days. May dispense FreeStyle [employer] or similar. diag E11.65   docusate sodium (COLACE) 100 MG capsule Take 100 mg by mouth at bedtime.   glucose blood (ACCU-CHEK AVIVA PLUS) test strip USE 1 STRIP TO CHECK GLUCOSE TWICE DAILY diag E11.65   insulin detemir (LEVEMIR FLEXTOUCH) 100 UNIT/ML FlexPen Inject 12 Units into the skin at bedtime.    Insulin Pen Needle (BD PEN NEEDLE NANO 2ND GEN) 32G X 4 MM MISC Use as directed with insulin   levothyroxine (EUTHYROX) 75 MCG tablet Take 1 tablet (75 mcg total) by mouth daily before breakfast.   memantine (NAMENDA) 5 MG tablet Take 2 tablets (10 mg total) by mouth 2 (two) times daily.   predniSONE (DELTASONE) 10 MG tablet Take one tablet by mouth 3 x day for 3 days, then take one tab 2 x day for 3 days, and then take one tab a day for 3 days   QUEtiapine (SEROQUEL) 100 MG tablet Take 1 tablet (100 mg total) by mouth at bedtime. (Patient taking differently: Take 50 mg by mouth at bedtime.)   terbinafine (LAMISIL) 1 % cream Apply 1 application topically 2 (two) times daily.   [DISCONTINUED] traMADol (ULTRAM) 50 MG tablet Take 1 tablet (50 mg total) by mouth every 8 (eight) hours as needed for moderate pain.   [DISCONTINUED] traMADol (ULTRAM) 50 MG tablet Take 1 tablet (50 mg total) by mouth every 8 (eight) hours as needed for moderate pain.   No facility-administered encounter medications on file as of 10/11/2021.    Surgical History: Past Surgical History:  Procedure Laterality Date   CARDIAC CATHETERIZATION     [facility]   CATARACT EXTRACTION     LUNG LOBECTOMY     THYROID SURGERY      Medical History: Past Medical History:  Diagnosis Date  Arthritis    Chorea    COPD (chronic obstructive pulmonary disease) (HCC)    Diabetes mellitus without complication (HCC)    Ear infection    Environmental allergies    GERD (gastroesophageal reflux disease)    Hx of rheumatic fever    Hx: UTI (urinary tract infection)    Hyperlipidemia    Hyperlipidemia    Hypertension    Hypothyroidism    Kidney disease    Lung cancer ([place])    Mini stroke    Stroke ([place])    x's 2   Vertigo     Family History: Family History  Problem Relation Age of Onset   Kidney disease Mother    Cancer Mother    Arthritis Mother    Heart attack Father    Heart disease Father    Arthritis Sister    ADD /  ADHD Son     Social History   Socioeconomic History   Marital status: Widowed    Spouse name: Not on file   Number of children: Not on file   Years of education: Not on file   Highest education level: Not on file  Occupational History   Not on file  Tobacco Use   Smoking status: Former    Types: Cigarettes   Smokeless tobacco: Former    Types: Chew    Quit date: 06/05/2020   Tobacco comments:    Quit X30 years ago  Vaping Use   Vaping Use: Never used  Substance and Sexual Activity   Alcohol use: No   Drug use: No   Sexual activity: Not on file  Other Topics Concern   Not on file  Social History Narrative   Not on file   Social Determinants of Health   Financial Resource Strain: Not on file  Food Insecurity: Not on file  Transportation Needs: Not on file  Physical Activity: Not on file  Stress: Not on file  Social Connections: Not on file  Intimate Partner Violence: Not on file      Review of Systems  Constitutional:  Negative for chills, fatigue and unexpected weight change.  HENT:  Negative for congestion, rhinorrhea, sneezing and sore throat.   Eyes:  Negative for redness.  Respiratory:  Negative for cough, chest tightness and shortness of breath.   Cardiovascular:  Negative for chest pain and palpitations.  Gastrointestinal:  Negative for abdominal pain, constipation, diarrhea, nausea and vomiting.  Genitourinary:  Negative for dysuria and frequency.  Musculoskeletal:  Negative for arthralgias, back pain, joint swelling and neck pain.  Skin:  Negative for rash.  Neurological: Negative.  Negative for tremors and numbness.  Hematological:  Negative for adenopathy. Does not bruise/bleed easily.  Psychiatric/Behavioral:  Negative for behavioral problems (Depression), sleep disturbance and suicidal ideas. The patient is not nervous/anxious.    Vital Signs: BP 140/62   Pulse 69   Temp 98.6 F (37 C)   Resp 16   Ht 5\' 2"  (1.575 m)   Wt 121 lb 3.2 oz (55  kg)   SpO2 96%   BMI 22.17 kg/m    Physical Exam Constitutional:      General: She is not in acute distress.    Appearance: Normal appearance. She is normal weight. She is not ill-appearing.  HENT:     Head: Normocephalic and atraumatic.  Eyes:     Pupils: Pupils are equal, round, and reactive to light.  Cardiovascular:     Rate and Rhythm: Normal rate and regular  rhythm.  Pulmonary:     Effort: Pulmonary effort is normal. No respiratory distress.  Neurological:     Mental Status: She is alert and oriented to person, place, and time.     Cranial Nerves: No cranial nerve deficit.     Coordination: Coordination normal.     Gait: Gait normal.  Psychiatric:        Mood and Affect: Mood normal.        Behavior: Behavior normal.       Assessment/Plan: 1. Witnessed syncope Witnessed syncope, EMS was called, chest compressions were done, patient came back without EMS initiating furhter CPR upon arrival. She was taken to ED for further evaluationCT head and chest xray were normal. She had labs done as well while in the ED but no specific cause of her syncope was identified.   2. Sciatica associated with disorder of lumbar spine Tramadol prescribed.    General Counseling: ilse billman understanding of the findings of todays visit and agrees with plan of treatment. I have discussed any further diagnostic evaluation that may be needed or ordered today. We also reviewed her medications today. she has been encouraged to call the office with any questions or concerns that should arise related to todays visit.    No orders of the defined types were placed in this encounter.    Meds ordered this encounter  Medications   DISCONTD: traMADol (ULTRAM) 50 MG tablet    Sig: Take 1 tablet (50 mg total) by mouth every 8 (eight) hours as needed for moderate pain.    Dispense:  45 tablet    Refill:  0    Return for previously scheduled in february.   Total time spent:20  Minutes Time spent includes review of chart, medications, test results, and follow up plan with the patient.   [place] Controlled Substance Database was reviewed by me.  This patient was seen by Jonetta Osgood, FNP-C in collaboration with Dr. Clayborn Bigness as a part of collaborative care agreement.   Voula Waln R. Valetta Fuller, MSN, FNP-C Internal medicine

## 2021-10-11 NOTE — Patient Instructions (Signed)
Please discontinue aspirin 325 mg tablet. Patient should be on 81 mg aspirin tablet, 1 tablet daily.

## 2021-10-12 DIAGNOSIS — E1122 Type 2 diabetes mellitus with diabetic chronic kidney disease: Secondary | ICD-10-CM | POA: Diagnosis not present

## 2021-10-12 DIAGNOSIS — N1831 Chronic kidney disease, stage 3a: Secondary | ICD-10-CM | POA: Diagnosis not present

## 2021-10-12 DIAGNOSIS — J449 Chronic obstructive pulmonary disease, unspecified: Secondary | ICD-10-CM | POA: Diagnosis not present

## 2021-10-12 DIAGNOSIS — G301 Alzheimer's disease with late onset: Secondary | ICD-10-CM | POA: Diagnosis not present

## 2021-10-12 DIAGNOSIS — I1310 Hypertensive heart and chronic kidney disease without heart failure, with stage 1 through stage 4 chronic kidney disease, or unspecified chronic kidney disease: Secondary | ICD-10-CM | POA: Diagnosis not present

## 2021-10-12 DIAGNOSIS — F0284 Dementia in other diseases classified elsewhere, unspecified severity, with anxiety: Secondary | ICD-10-CM | POA: Diagnosis not present

## 2021-10-15 DIAGNOSIS — G301 Alzheimer's disease with late onset: Secondary | ICD-10-CM | POA: Diagnosis not present

## 2021-10-15 DIAGNOSIS — J449 Chronic obstructive pulmonary disease, unspecified: Secondary | ICD-10-CM | POA: Diagnosis not present

## 2021-10-15 DIAGNOSIS — E1122 Type 2 diabetes mellitus with diabetic chronic kidney disease: Secondary | ICD-10-CM | POA: Diagnosis not present

## 2021-10-15 DIAGNOSIS — N1831 Chronic kidney disease, stage 3a: Secondary | ICD-10-CM | POA: Diagnosis not present

## 2021-10-15 DIAGNOSIS — F0284 Dementia in other diseases classified elsewhere, unspecified severity, with anxiety: Secondary | ICD-10-CM | POA: Diagnosis not present

## 2021-10-15 DIAGNOSIS — I1310 Hypertensive heart and chronic kidney disease without heart failure, with stage 1 through stage 4 chronic kidney disease, or unspecified chronic kidney disease: Secondary | ICD-10-CM | POA: Diagnosis not present

## 2021-10-19 DIAGNOSIS — N1831 Chronic kidney disease, stage 3a: Secondary | ICD-10-CM | POA: Diagnosis not present

## 2021-10-19 DIAGNOSIS — J449 Chronic obstructive pulmonary disease, unspecified: Secondary | ICD-10-CM | POA: Diagnosis not present

## 2021-10-19 DIAGNOSIS — F0284 Dementia in other diseases classified elsewhere, unspecified severity, with anxiety: Secondary | ICD-10-CM | POA: Diagnosis not present

## 2021-10-19 DIAGNOSIS — E1122 Type 2 diabetes mellitus with diabetic chronic kidney disease: Secondary | ICD-10-CM | POA: Diagnosis not present

## 2021-10-19 DIAGNOSIS — I1310 Hypertensive heart and chronic kidney disease without heart failure, with stage 1 through stage 4 chronic kidney disease, or unspecified chronic kidney disease: Secondary | ICD-10-CM | POA: Diagnosis not present

## 2021-10-19 DIAGNOSIS — G301 Alzheimer's disease with late onset: Secondary | ICD-10-CM | POA: Diagnosis not present

## 2021-10-23 DIAGNOSIS — E1122 Type 2 diabetes mellitus with diabetic chronic kidney disease: Secondary | ICD-10-CM | POA: Diagnosis not present

## 2021-10-23 DIAGNOSIS — J449 Chronic obstructive pulmonary disease, unspecified: Secondary | ICD-10-CM | POA: Diagnosis not present

## 2021-10-23 DIAGNOSIS — N1831 Chronic kidney disease, stage 3a: Secondary | ICD-10-CM | POA: Diagnosis not present

## 2021-10-23 DIAGNOSIS — G301 Alzheimer's disease with late onset: Secondary | ICD-10-CM | POA: Diagnosis not present

## 2021-10-23 DIAGNOSIS — F0284 Dementia in other diseases classified elsewhere, unspecified severity, with anxiety: Secondary | ICD-10-CM | POA: Diagnosis not present

## 2021-10-23 DIAGNOSIS — I1310 Hypertensive heart and chronic kidney disease without heart failure, with stage 1 through stage 4 chronic kidney disease, or unspecified chronic kidney disease: Secondary | ICD-10-CM | POA: Diagnosis not present

## 2021-10-25 DIAGNOSIS — F0284 Dementia in other diseases classified elsewhere, unspecified severity, with anxiety: Secondary | ICD-10-CM | POA: Diagnosis not present

## 2021-10-25 DIAGNOSIS — E1122 Type 2 diabetes mellitus with diabetic chronic kidney disease: Secondary | ICD-10-CM | POA: Diagnosis not present

## 2021-10-25 DIAGNOSIS — G301 Alzheimer's disease with late onset: Secondary | ICD-10-CM | POA: Diagnosis not present

## 2021-10-25 DIAGNOSIS — J449 Chronic obstructive pulmonary disease, unspecified: Secondary | ICD-10-CM | POA: Diagnosis not present

## 2021-10-25 DIAGNOSIS — I1310 Hypertensive heart and chronic kidney disease without heart failure, with stage 1 through stage 4 chronic kidney disease, or unspecified chronic kidney disease: Secondary | ICD-10-CM | POA: Diagnosis not present

## 2021-10-25 DIAGNOSIS — N1831 Chronic kidney disease, stage 3a: Secondary | ICD-10-CM | POA: Diagnosis not present

## 2021-10-28 ENCOUNTER — Encounter: Payer: Self-pay | Admitting: Nurse Practitioner

## 2021-10-29 DIAGNOSIS — G301 Alzheimer's disease with late onset: Secondary | ICD-10-CM | POA: Diagnosis not present

## 2021-10-29 DIAGNOSIS — I1310 Hypertensive heart and chronic kidney disease without heart failure, with stage 1 through stage 4 chronic kidney disease, or unspecified chronic kidney disease: Secondary | ICD-10-CM | POA: Diagnosis not present

## 2021-10-29 DIAGNOSIS — N1831 Chronic kidney disease, stage 3a: Secondary | ICD-10-CM | POA: Diagnosis not present

## 2021-10-29 DIAGNOSIS — J449 Chronic obstructive pulmonary disease, unspecified: Secondary | ICD-10-CM | POA: Diagnosis not present

## 2021-10-29 DIAGNOSIS — F0284 Dementia in other diseases classified elsewhere, unspecified severity, with anxiety: Secondary | ICD-10-CM | POA: Diagnosis not present

## 2021-10-29 DIAGNOSIS — E1122 Type 2 diabetes mellitus with diabetic chronic kidney disease: Secondary | ICD-10-CM | POA: Diagnosis not present

## 2021-11-02 DIAGNOSIS — E1122 Type 2 diabetes mellitus with diabetic chronic kidney disease: Secondary | ICD-10-CM | POA: Diagnosis not present

## 2021-11-02 DIAGNOSIS — G301 Alzheimer's disease with late onset: Secondary | ICD-10-CM | POA: Diagnosis not present

## 2021-11-02 DIAGNOSIS — I1310 Hypertensive heart and chronic kidney disease without heart failure, with stage 1 through stage 4 chronic kidney disease, or unspecified chronic kidney disease: Secondary | ICD-10-CM | POA: Diagnosis not present

## 2021-11-02 DIAGNOSIS — N1831 Chronic kidney disease, stage 3a: Secondary | ICD-10-CM | POA: Diagnosis not present

## 2021-11-02 DIAGNOSIS — J449 Chronic obstructive pulmonary disease, unspecified: Secondary | ICD-10-CM | POA: Diagnosis not present

## 2021-11-02 DIAGNOSIS — F0284 Dementia in other diseases classified elsewhere, unspecified severity, with anxiety: Secondary | ICD-10-CM | POA: Diagnosis not present

## 2021-11-05 DIAGNOSIS — Z20822 Contact with and (suspected) exposure to covid-19: Secondary | ICD-10-CM | POA: Diagnosis not present

## 2021-11-08 DIAGNOSIS — J449 Chronic obstructive pulmonary disease, unspecified: Secondary | ICD-10-CM | POA: Diagnosis not present

## 2021-11-08 DIAGNOSIS — I1310 Hypertensive heart and chronic kidney disease without heart failure, with stage 1 through stage 4 chronic kidney disease, or unspecified chronic kidney disease: Secondary | ICD-10-CM | POA: Diagnosis not present

## 2021-11-08 DIAGNOSIS — Z794 Long term (current) use of insulin: Secondary | ICD-10-CM | POA: Diagnosis not present

## 2021-11-08 DIAGNOSIS — F411 Generalized anxiety disorder: Secondary | ICD-10-CM | POA: Diagnosis not present

## 2021-11-08 DIAGNOSIS — N1831 Chronic kidney disease, stage 3a: Secondary | ICD-10-CM | POA: Diagnosis not present

## 2021-11-08 DIAGNOSIS — Z87440 Personal history of urinary (tract) infections: Secondary | ICD-10-CM | POA: Diagnosis not present

## 2021-11-08 DIAGNOSIS — Z9181 History of falling: Secondary | ICD-10-CM | POA: Diagnosis not present

## 2021-11-08 DIAGNOSIS — M4712 Other spondylosis with myelopathy, cervical region: Secondary | ICD-10-CM | POA: Diagnosis not present

## 2021-11-08 DIAGNOSIS — G301 Alzheimer's disease with late onset: Secondary | ICD-10-CM | POA: Diagnosis not present

## 2021-11-08 DIAGNOSIS — M150 Primary generalized (osteo)arthritis: Secondary | ICD-10-CM | POA: Diagnosis not present

## 2021-11-08 DIAGNOSIS — G255 Other chorea: Secondary | ICD-10-CM | POA: Diagnosis not present

## 2021-11-08 DIAGNOSIS — K219 Gastro-esophageal reflux disease without esophagitis: Secondary | ICD-10-CM | POA: Diagnosis not present

## 2021-11-08 DIAGNOSIS — Z87891 Personal history of nicotine dependence: Secondary | ICD-10-CM | POA: Diagnosis not present

## 2021-11-08 DIAGNOSIS — N3946 Mixed incontinence: Secondary | ICD-10-CM | POA: Diagnosis not present

## 2021-11-08 DIAGNOSIS — F0284 Dementia in other diseases classified elsewhere, unspecified severity, with anxiety: Secondary | ICD-10-CM | POA: Diagnosis not present

## 2021-11-08 DIAGNOSIS — E039 Hypothyroidism, unspecified: Secondary | ICD-10-CM | POA: Diagnosis not present

## 2021-11-08 DIAGNOSIS — Z7982 Long term (current) use of aspirin: Secondary | ICD-10-CM | POA: Diagnosis not present

## 2021-11-08 DIAGNOSIS — M7070 Other bursitis of hip, unspecified hip: Secondary | ICD-10-CM | POA: Diagnosis not present

## 2021-11-08 DIAGNOSIS — E1122 Type 2 diabetes mellitus with diabetic chronic kidney disease: Secondary | ICD-10-CM | POA: Diagnosis not present

## 2021-11-08 DIAGNOSIS — E785 Hyperlipidemia, unspecified: Secondary | ICD-10-CM | POA: Diagnosis not present

## 2021-11-08 DIAGNOSIS — Z8673 Personal history of transient ischemic attack (TIA), and cerebral infarction without residual deficits: Secondary | ICD-10-CM | POA: Diagnosis not present

## 2021-11-08 DIAGNOSIS — Z85118 Personal history of other malignant neoplasm of bronchus and lung: Secondary | ICD-10-CM | POA: Diagnosis not present

## 2021-11-08 DIAGNOSIS — R9082 White matter disease, unspecified: Secondary | ICD-10-CM | POA: Diagnosis not present

## 2021-11-09 ENCOUNTER — Other Ambulatory Visit: Payer: Self-pay | Admitting: Nurse Practitioner

## 2021-11-09 DIAGNOSIS — M5386 Other specified dorsopathies, lumbar region: Secondary | ICD-10-CM

## 2021-11-10 NOTE — Telephone Encounter (Signed)
Med sent to pharmacy.

## 2021-11-12 ENCOUNTER — Encounter: Payer: Self-pay | Admitting: Nurse Practitioner

## 2021-11-16 DIAGNOSIS — N1831 Chronic kidney disease, stage 3a: Secondary | ICD-10-CM | POA: Diagnosis not present

## 2021-11-16 DIAGNOSIS — I1310 Hypertensive heart and chronic kidney disease without heart failure, with stage 1 through stage 4 chronic kidney disease, or unspecified chronic kidney disease: Secondary | ICD-10-CM | POA: Diagnosis not present

## 2021-11-16 DIAGNOSIS — J449 Chronic obstructive pulmonary disease, unspecified: Secondary | ICD-10-CM | POA: Diagnosis not present

## 2021-11-16 DIAGNOSIS — F0284 Dementia in other diseases classified elsewhere, unspecified severity, with anxiety: Secondary | ICD-10-CM | POA: Diagnosis not present

## 2021-11-16 DIAGNOSIS — G301 Alzheimer's disease with late onset: Secondary | ICD-10-CM | POA: Diagnosis not present

## 2021-11-16 DIAGNOSIS — E1122 Type 2 diabetes mellitus with diabetic chronic kidney disease: Secondary | ICD-10-CM | POA: Diagnosis not present

## 2021-11-21 DIAGNOSIS — G301 Alzheimer's disease with late onset: Secondary | ICD-10-CM | POA: Diagnosis not present

## 2021-11-21 DIAGNOSIS — E1122 Type 2 diabetes mellitus with diabetic chronic kidney disease: Secondary | ICD-10-CM | POA: Diagnosis not present

## 2021-11-21 DIAGNOSIS — F0284 Dementia in other diseases classified elsewhere, unspecified severity, with anxiety: Secondary | ICD-10-CM | POA: Diagnosis not present

## 2021-11-21 DIAGNOSIS — N1831 Chronic kidney disease, stage 3a: Secondary | ICD-10-CM | POA: Diagnosis not present

## 2021-11-21 DIAGNOSIS — J449 Chronic obstructive pulmonary disease, unspecified: Secondary | ICD-10-CM | POA: Diagnosis not present

## 2021-11-21 DIAGNOSIS — I1310 Hypertensive heart and chronic kidney disease without heart failure, with stage 1 through stage 4 chronic kidney disease, or unspecified chronic kidney disease: Secondary | ICD-10-CM | POA: Diagnosis not present

## 2021-11-26 DIAGNOSIS — J449 Chronic obstructive pulmonary disease, unspecified: Secondary | ICD-10-CM | POA: Diagnosis not present

## 2021-11-26 DIAGNOSIS — E1122 Type 2 diabetes mellitus with diabetic chronic kidney disease: Secondary | ICD-10-CM | POA: Diagnosis not present

## 2021-11-26 DIAGNOSIS — N1831 Chronic kidney disease, stage 3a: Secondary | ICD-10-CM | POA: Diagnosis not present

## 2021-11-26 DIAGNOSIS — I1310 Hypertensive heart and chronic kidney disease without heart failure, with stage 1 through stage 4 chronic kidney disease, or unspecified chronic kidney disease: Secondary | ICD-10-CM | POA: Diagnosis not present

## 2021-11-26 DIAGNOSIS — F0284 Dementia in other diseases classified elsewhere, unspecified severity, with anxiety: Secondary | ICD-10-CM | POA: Diagnosis not present

## 2021-11-26 DIAGNOSIS — G301 Alzheimer's disease with late onset: Secondary | ICD-10-CM | POA: Diagnosis not present

## 2021-12-05 ENCOUNTER — Other Ambulatory Visit: Payer: Self-pay | Admitting: Internal Medicine

## 2021-12-13 DIAGNOSIS — Z20822 Contact with and (suspected) exposure to covid-19: Secondary | ICD-10-CM | POA: Diagnosis not present

## 2021-12-18 ENCOUNTER — Other Ambulatory Visit: Payer: Self-pay | Admitting: Nurse Practitioner

## 2021-12-18 DIAGNOSIS — M5386 Other specified dorsopathies, lumbar region: Secondary | ICD-10-CM

## 2021-12-19 NOTE — Telephone Encounter (Signed)
Med sent to pharmacy.

## 2021-12-27 ENCOUNTER — Encounter: Payer: Self-pay | Admitting: Nurse Practitioner

## 2021-12-27 ENCOUNTER — Other Ambulatory Visit: Payer: Self-pay

## 2021-12-27 ENCOUNTER — Ambulatory Visit (INDEPENDENT_AMBULATORY_CARE_PROVIDER_SITE_OTHER): Payer: Medicare Other | Admitting: Nurse Practitioner

## 2021-12-27 VITALS — BP 140/80 | HR 79 | Temp 98.9°F | Resp 16 | Ht 62.0 in | Wt 123.0 lb

## 2021-12-27 DIAGNOSIS — F02818 Dementia in other diseases classified elsewhere, unspecified severity, with other behavioral disturbance: Secondary | ICD-10-CM

## 2021-12-27 DIAGNOSIS — Z794 Long term (current) use of insulin: Secondary | ICD-10-CM

## 2021-12-27 DIAGNOSIS — I10 Essential (primary) hypertension: Secondary | ICD-10-CM

## 2021-12-27 DIAGNOSIS — E1165 Type 2 diabetes mellitus with hyperglycemia: Secondary | ICD-10-CM

## 2021-12-27 DIAGNOSIS — G301 Alzheimer's disease with late onset: Secondary | ICD-10-CM

## 2021-12-27 LAB — POCT GLYCOSYLATED HEMOGLOBIN (HGB A1C): Hemoglobin A1C: 6.3 % — AB (ref 4.0–5.6)

## 2021-12-27 NOTE — Progress Notes (Unsigned)
[facility] [place], [place] [address]  Internal MEDICINE  Office Visit Note  Patient Name: Samantha Franco  [id number]  [id number]  Date of Service: 12/27/2021  Chief Complaint  Patient presents with   Follow-up   Diabetes   Gastroesophageal Reflux   Hypertension   Hyperlipidemia   COPD    HPI Samantha Franco presents for a follow-up visit for  A1c 6.3  140/80   Current Medication: Outpatient Encounter Medications as of 12/27/2021  Medication Sig   allopurinol (ZYLOPRIM) 100 MG tablet TAKE 1 & 1/2 (ONE & ONE-HALF) TABLETS BY MOUTH NIGHTLY FOR  GOUT (Patient taking differently: Take 150 mg by mouth at bedtime as needed (gout symptoms).)   ALPRAZolam (XANAX) 0.25 MG tablet Take 1 tablet (0.25 mg total) by mouth at bedtime as needed for anxiety or sleep.   aspirin 325 MG tablet Take 325 mg by mouth daily.   bisoprolol-hydrochlorothiazide (ZIAC) 5-6.25 MG tablet Take 1 tablet by mouth once daily   Continuous Blood Gluc Receiver (FREESTYLE LIBRE 2 READER) DEVI Use as directed DX E11.65   Continuous Blood Gluc Sensor MISC Use as directed every 14 days. May dispense FreeStyle [employer] or similar. diag E11.65   docusate sodium (COLACE) 100 MG capsule Take 100 mg by mouth at bedtime.   glucose blood (ACCU-CHEK AVIVA PLUS) test strip USE 1 STRIP TO CHECK GLUCOSE TWICE DAILY diag E11.65   insulin detemir (LEVEMIR FLEXTOUCH) 100 UNIT/ML FlexPen Inject 12 Units into the skin at bedtime.   Insulin Pen Needle (BD PEN NEEDLE NANO 2ND GEN) 32G X 4 MM MISC Use as directed with insulin   levothyroxine (EUTHYROX) 75 MCG tablet Take 1 tablet (75 mcg total) by mouth daily before breakfast.   memantine (NAMENDA) 5 MG tablet Take 2 tablets (10 mg total) by mouth 2 (two) times daily.   predniSONE (DELTASONE) 10 MG tablet Take one tablet by mouth 3 x day for 3 days, then take one tab 2 x day for 3 days, and then take one tab a day for 3 days   QUEtiapine (SEROQUEL) 100 MG  tablet Take 1 tablet (100 mg total) by mouth at bedtime. (Patient taking differently: Take 50 mg by mouth at bedtime.)   terbinafine (LAMISIL) 1 % cream Apply 1 application topically 2 (two) times daily.   traMADol (ULTRAM) 50 MG tablet TAKE 1 TABLET BY MOUTH EVERY 8 HOURS AS NEEDED FOR MODERATE PAIN   No facility-administered encounter medications on file as of 12/27/2021.    Surgical History: Past Surgical History:  Procedure Laterality Date   CARDIAC CATHETERIZATION     [facility]   CATARACT EXTRACTION     LUNG LOBECTOMY     THYROID SURGERY      Medical History: Past Medical History:  Diagnosis Date   Arthritis    Chorea    COPD (chronic obstructive pulmonary disease) ([place])    Diabetes mellitus without complication (HCC)    Ear infection    Environmental allergies    GERD (gastroesophageal reflux disease)    Hx of rheumatic fever    Hx: UTI (urinary tract infection)    Hyperlipidemia    Hyperlipidemia    Hypertension    Hypothyroidism    Kidney disease    Lung cancer ([place])    Mini stroke    Stroke ([place])    x's 2   Vertigo     Family History: Family History  Problem Relation Age of Onset   Kidney disease  Mother    Cancer Mother    Arthritis Mother    Heart attack Father    Heart disease Father    Arthritis Sister    ADD / ADHD Son     Social History   Socioeconomic History   Marital status: Widowed    Spouse name: Not on file   Number of children: Not on file   Years of education: Not on file   Highest education level: Not on file  Occupational History   Not on file  Tobacco Use   Smoking status: Former    Types: Cigarettes   Smokeless tobacco: Former    Types: Chew    Quit date: 06/05/2020   Tobacco comments:    Quit X30 years ago  Vaping Use   Vaping Use: Never used  Substance and Sexual Activity   Alcohol use: No   Drug use: No   Sexual activity: Not on file  Other Topics Concern   Not on file  Social History Narrative   Not on file    Social Determinants of Health   Financial Resource Strain: Not on file  Food Insecurity: Not on file  Transportation Needs: Not on file  Physical Activity: Not on file  Stress: Not on file  Social Connections: Not on file  Intimate Partner Violence: Not on file      Review of Systems  Vital Signs: BP (!) 150/88    Pulse 79    Temp 98.9 F (37.2 C)    Resp 16    Ht 5\' 2"  (1.575 m)    Wt 123 lb (55.8 kg)    SpO2 98%    BMI 22.50 kg/m    Physical Exam     Assessment/Plan:   General Counseling: Samantha Franco verbalizes understanding of the findings of todays visit and agrees with plan of treatment. I have discussed any further diagnostic evaluation that may be needed or ordered today. We also reviewed her medications today. she has been encouraged to call the office with any questions or concerns that should arise related to todays visit.    Orders Placed This Encounter  Procedures   POCT HgB A1C    No orders of the defined types were placed in this encounter.   Return in about 3 months (around 03/26/2022) for F/U, Recheck A1C, Samantha Franco PCP.   Total time spent:*** Minutes Time spent includes review of chart, medications, test results, and follow up plan with the patient.   [place] Controlled Substance Database was reviewed by me.  This patient was seen by Samantha Osgood, FNP-C in collaboration with Dr. Clayborn Franco as a part of collaborative care agreement.   Samantha Franco R. Samantha Fuller, MSN, FNP-C Internal medicine

## 2022-01-09 DIAGNOSIS — Z20822 Contact with and (suspected) exposure to covid-19: Secondary | ICD-10-CM | POA: Diagnosis not present

## 2022-01-15 ENCOUNTER — Other Ambulatory Visit: Payer: Self-pay | Admitting: Nurse Practitioner

## 2022-01-15 DIAGNOSIS — F02818 Dementia in other diseases classified elsewhere, unspecified severity, with other behavioral disturbance: Secondary | ICD-10-CM

## 2022-01-15 DIAGNOSIS — G301 Alzheimer's disease with late onset: Secondary | ICD-10-CM

## 2022-01-15 DIAGNOSIS — E039 Hypothyroidism, unspecified: Secondary | ICD-10-CM

## 2022-01-15 DIAGNOSIS — M5386 Other specified dorsopathies, lumbar region: Secondary | ICD-10-CM

## 2022-01-16 DIAGNOSIS — Z20822 Contact with and (suspected) exposure to covid-19: Secondary | ICD-10-CM | POA: Diagnosis not present

## 2022-02-03 ENCOUNTER — Encounter: Payer: Self-pay | Admitting: Nurse Practitioner

## 2022-02-09 DIAGNOSIS — Z20822 Contact with and (suspected) exposure to covid-19: Secondary | ICD-10-CM | POA: Diagnosis not present

## 2022-02-11 DIAGNOSIS — Z20822 Contact with and (suspected) exposure to covid-19: Secondary | ICD-10-CM | POA: Diagnosis not present

## 2022-02-22 DIAGNOSIS — Z20822 Contact with and (suspected) exposure to covid-19: Secondary | ICD-10-CM | POA: Diagnosis not present

## 2022-02-27 DIAGNOSIS — Z20828 Contact with and (suspected) exposure to other viral communicable diseases: Secondary | ICD-10-CM | POA: Diagnosis not present

## 2022-02-28 DIAGNOSIS — Z20822 Contact with and (suspected) exposure to covid-19: Secondary | ICD-10-CM | POA: Diagnosis not present

## 2022-03-13 ENCOUNTER — Telehealth: Payer: Medicare Other

## 2022-03-26 ENCOUNTER — Other Ambulatory Visit: Payer: Self-pay | Admitting: Internal Medicine

## 2022-03-26 DIAGNOSIS — F02818 Dementia in other diseases classified elsewhere, unspecified severity, with other behavioral disturbance: Secondary | ICD-10-CM

## 2022-03-26 DIAGNOSIS — M5386 Other specified dorsopathies, lumbar region: Secondary | ICD-10-CM

## 2022-03-28 ENCOUNTER — Ambulatory Visit: Payer: Medicare Other | Admitting: Nurse Practitioner

## 2022-03-31 DIAGNOSIS — J309 Allergic rhinitis, unspecified: Secondary | ICD-10-CM | POA: Diagnosis not present

## 2022-03-31 DIAGNOSIS — H6691 Otitis media, unspecified, right ear: Secondary | ICD-10-CM | POA: Diagnosis not present

## 2022-04-06 ENCOUNTER — Encounter: Payer: Self-pay | Admitting: Nurse Practitioner

## 2022-04-10 ENCOUNTER — Other Ambulatory Visit
Admission: RE | Admit: 2022-04-10 | Discharge: 2022-04-10 | Disposition: A | Discharge status: Home / Self Care | Payer: Medicare Other | Source: Ambulatory Visit | Attending: Nurse Practitioner | Admitting: Nurse Practitioner

## 2022-04-10 ENCOUNTER — Encounter: Payer: Self-pay | Admitting: Nurse Practitioner

## 2022-04-10 ENCOUNTER — Ambulatory Visit
Admission: RE | Admit: 2022-04-10 | Discharge: 2022-04-10 | Disposition: A | Discharge status: Home / Self Care | Payer: Medicare Other | Source: Ambulatory Visit | Attending: Nurse Practitioner | Admitting: Nurse Practitioner

## 2022-04-10 ENCOUNTER — Ambulatory Visit (INDEPENDENT_AMBULATORY_CARE_PROVIDER_SITE_OTHER): Payer: Medicare Other | Admitting: Nurse Practitioner

## 2022-04-10 VITALS — BP 132/55 | HR 70 | Temp 98.4°F | Resp 16 | Ht 62.0 in | Wt 112.0 lb

## 2022-04-10 DIAGNOSIS — G301 Alzheimer's disease with late onset: Secondary | ICD-10-CM | POA: Diagnosis not present

## 2022-04-10 DIAGNOSIS — Z794 Long term (current) use of insulin: Secondary | ICD-10-CM

## 2022-04-10 DIAGNOSIS — E039 Hypothyroidism, unspecified: Secondary | ICD-10-CM | POA: Diagnosis not present

## 2022-04-10 DIAGNOSIS — Z9181 History of falling: Secondary | ICD-10-CM

## 2022-04-10 DIAGNOSIS — F02818 Dementia in other diseases classified elsewhere, unspecified severity, with other behavioral disturbance: Secondary | ICD-10-CM

## 2022-04-10 DIAGNOSIS — F0280 Dementia in other diseases classified elsewhere without behavioral disturbance: Secondary | ICD-10-CM

## 2022-04-10 DIAGNOSIS — R296 Repeated falls: Secondary | ICD-10-CM | POA: Diagnosis not present

## 2022-04-10 DIAGNOSIS — R630 Anorexia: Secondary | ICD-10-CM | POA: Insufficient documentation

## 2022-04-10 DIAGNOSIS — M6281 Muscle weakness (generalized): Secondary | ICD-10-CM | POA: Diagnosis not present

## 2022-04-10 DIAGNOSIS — E1165 Type 2 diabetes mellitus with hyperglycemia: Secondary | ICD-10-CM

## 2022-04-10 DIAGNOSIS — G4710 Hypersomnia, unspecified: Secondary | ICD-10-CM

## 2022-04-10 DIAGNOSIS — W19XXXA Unspecified fall, initial encounter: Secondary | ICD-10-CM | POA: Insufficient documentation

## 2022-04-10 DIAGNOSIS — S0990XA Unspecified injury of head, initial encounter: Secondary | ICD-10-CM | POA: Insufficient documentation

## 2022-04-10 DIAGNOSIS — R5381 Other malaise: Secondary | ICD-10-CM | POA: Diagnosis not present

## 2022-04-10 DIAGNOSIS — R531 Weakness: Secondary | ICD-10-CM | POA: Diagnosis not present

## 2022-04-10 LAB — POCT GLYCOSYLATED HEMOGLOBIN (HGB A1C): Hemoglobin A1C: 6.1 % — AB (ref 4.0–5.6)

## 2022-04-10 LAB — CBC WITH DIFFERENTIAL/PLATELET
Abs Immature Granulocytes: 0.07 10*3/uL (ref 0.00–0.07)
Basophils Absolute: 0.1 10*3/uL (ref 0.0–0.1)
Basophils Relative: 1 %
Eosinophils Absolute: 0.4 10*3/uL (ref 0.0–0.5)
Eosinophils Relative: 4 %
HCT: 43 % (ref 36.0–46.0)
Hemoglobin: 13.7 g/dL (ref 12.0–15.0)
Immature Granulocytes: 1 %
Lymphocytes Relative: 15 %
Lymphs Abs: 1.6 10*3/uL (ref 0.7–4.0)
MCH: 27.6 pg (ref 26.0–34.0)
MCHC: 31.9 g/dL (ref 30.0–36.0)
MCV: 86.5 fL (ref 80.0–100.0)
Monocytes Absolute: 0.8 10*3/uL (ref 0.1–1.0)
Monocytes Relative: 7 %
Neutro Abs: 7.7 10*3/uL (ref 1.7–7.7)
Neutrophils Relative %: 72 %
Platelets: 234 10*3/uL (ref 150–400)
RBC: 4.97 MIL/uL (ref 3.87–5.11)
RDW: 13.7 % (ref 11.5–15.5)
WBC: 10.6 10*3/uL — ABNORMAL HIGH (ref 4.0–10.5)
nRBC: 0 % (ref 0.0–0.2)

## 2022-04-10 LAB — COMPREHENSIVE METABOLIC PANEL
ALT: 16 U/L (ref 0–44)
AST: 21 U/L (ref 15–41)
Albumin: 3.8 g/dL (ref 3.5–5.0)
Alkaline Phosphatase: 58 U/L (ref 38–126)
Anion gap: 10 (ref 5–15)
BUN: 46 mg/dL — ABNORMAL HIGH (ref 8–23)
CO2: 29 mmol/L (ref 22–32)
Calcium: 9.5 mg/dL (ref 8.9–10.3)
Chloride: 103 mmol/L (ref 98–111)
Creatinine, Ser: 1.77 mg/dL — ABNORMAL HIGH (ref 0.44–1.00)
GFR, Estimated: 27 mL/min — ABNORMAL LOW (ref >60)
Glucose, Bld: 139 mg/dL — ABNORMAL HIGH (ref 70–99)
Potassium: 3.9 mmol/L (ref 3.5–5.1)
Sodium: 142 mmol/L (ref 135–145)
Total Bilirubin: 0.7 mg/dL (ref 0.3–1.2)
Total Protein: 7.1 g/dL (ref 6.5–8.1)

## 2022-04-10 LAB — VITAMIN B12: Vitamin B-12: 268 pg/mL (ref 180–914)

## 2022-04-10 LAB — FOLATE: Folate: 7.8 ng/mL (ref >5.9)

## 2022-04-10 LAB — T4, FREE: Free T4: 1.29 ng/dL — ABNORMAL HIGH (ref 0.61–1.12)

## 2022-04-10 LAB — TSH: TSH: 0.059 u[IU]/mL — ABNORMAL LOW (ref 0.350–4.500)

## 2022-04-10 MED ORDER — MEMANTINE HCL 5 MG PO TABS: 10.0000 mg | ORAL_TABLET | Freq: Two times a day (BID) | ORAL | 1 refills | Status: AC

## 2022-04-10 MED ORDER — QUETIAPINE FUMARATE 100 MG PO TABS: 100.0000 mg | ORAL_TABLET | Freq: Every day | ORAL | 1 refills | Status: AC

## 2022-04-10 MED ORDER — ALPRAZOLAM 0.25 MG PO TABS: 0.2500 mg | ORAL_TABLET | Freq: Every evening | ORAL | 2 refills | Status: AC | PRN

## 2022-04-10 MED ORDER — BISOPROLOL-HYDROCHLOROTHIAZIDE 5-6.25 MG PO TABS: 1.0000 | ORAL_TABLET | Freq: Every day | ORAL | 1 refills | Status: AC

## 2022-04-10 MED ORDER — LEVOTHYROXINE SODIUM 75 MCG PO TABS: 75.0000 ug | ORAL_TABLET | Freq: Every day | ORAL | 1 refills | Status: AC

## 2022-04-10 NOTE — Progress Notes (Signed)
[facility] [place], [place] [address]  Internal MEDICINE  Office Visit Note  Patient Name: Samantha Franco  [id number]  [id number]  Date of Service: 04/10/2022  Chief Complaint  Patient presents with   Acute Visit    Saturday and Sunday pt fell, weakness, loss of appetite, hips, shoulders, and back pain   Fatigue   Generalized Body Aches     HPI Samantha Franco presents for an acute sick visit for recent fall x2 with head injury over the past weekend.  Patient refused to go to the ER or urgent care over the weekend when her son and other family members tried to convince her to go.  With the first fall she fell forward and hit her head in the flower bed outside of her house and with the second fall she fell backwards onto her bottom. Along with the recent falls and her being more unsteady when standing and walking, she has had decreased appetite for approximately the past 2 months, increased fatigue and generalized muscle weakness as well as weight loss.  She has lost 11 pounds since her previous office visit and has been sleeping most of the day, approximately 20 hours/day per report from her son who accompanied her to the office visit today. During the office visit she repeatedly stated that she did not feel like she would be here much longer and kept thinking her son and his wife and their family for good life.  The patient's son states that she has been saying things like this at home as well and he is not sure if it is just the patient or if she is sensing the end or if hospice should be involved. With the fall and her symptoms is important at least further investigate with a CT of the head and some lab work but due to her age it is also important to minimize the amount of interventions and evaluation that we do and this was agreeable with the son who was at the office visit. It was reported that she had back pain and some hip and shoulder pain after the falls but when  asked about it during the office visit she repeatedly stated I do not know or I am not sure and was not able to answer any questions fully when asked.  Patient seemed fairly confused.   [place] [address] 2 times in the past 2 days, hit head with first fall, fell the next day backwards Decreased appetite x 1-2 months Fatigue and muscle weakness Lost 11 lbs  Feeling tired and sleepy Pt has been stating she doesn't feel like she will be here much longer  Current Medication:  Outpatient Encounter Medications as of 04/10/2022  Medication Sig   aspirin 325 MG tablet Take 325 mg by mouth daily.   Continuous Blood Gluc Receiver (FREESTYLE LIBRE 2 READER) DEVI Use as directed DX E11.65   Continuous Blood Gluc Sensor MISC Use as directed every 14 days. May dispense FreeStyle [employer] or similar. diag E11.65   docusate sodium (COLACE) 100 MG capsule Take 100 mg by mouth at bedtime.   glucose blood (ACCU-CHEK AVIVA PLUS) test strip USE 1 STRIP TO CHECK GLUCOSE TWICE DAILY diag E11.65   insulin detemir (LEVEMIR FLEXTOUCH) 100 UNIT/ML FlexPen Inject 12 Units into the skin at bedtime.   Insulin Pen Needle (BD PEN NEEDLE NANO 2ND GEN) 32G X 4 MM MISC Use as directed with insulin   terbinafine (LAMISIL) 1 % cream Apply 1  application topically 2 (two) times daily.   traMADol (ULTRAM) 50 MG tablet TAKE 1 TABLET BY MOUTH EVERY 8 HOURS AS NEEDED FOR MODERATE PAIN   [DISCONTINUED] allopurinol (ZYLOPRIM) 100 MG tablet TAKE 1 & 1/2 (ONE & ONE-HALF) TABLETS BY MOUTH NIGHTLY FOR  GOUT (Patient taking differently: Take 150 mg by mouth at bedtime as needed (gout symptoms).)   [DISCONTINUED] ALPRAZolam (XANAX) 0.25 MG tablet TAKE 1 TABLET BY MOUTH AT BEDTIME AS NEEDED FOR ANXIETY   [DISCONTINUED] bisoprolol-hydrochlorothiazide (ZIAC) 5-6.25 MG tablet Take 1 tablet by mouth once daily   [DISCONTINUED] levothyroxine (SYNTHROID) 75 MCG tablet TAKE 1 TABLET BY MOUTH ONCE DAILY BEFORE BREAKFAST   [DISCONTINUED] memantine  (NAMENDA) 5 MG tablet Take 2 tablets (10 mg total) by mouth 2 (two) times daily.   [DISCONTINUED] predniSONE (DELTASONE) 10 MG tablet Take one tablet by mouth 3 x day for 3 days, then take one tab 2 x day for 3 days, and then take one tab a day for 3 days   [DISCONTINUED] QUEtiapine (SEROQUEL) 100 MG tablet Take 1 tablet (100 mg total) by mouth at bedtime. (Patient taking differently: Take 50 mg by mouth at bedtime.)   ALPRAZolam (XANAX) 0.25 MG tablet Take 1 tablet (0.25 mg total) by mouth at bedtime as needed for anxiety.   bisoprolol-hydrochlorothiazide (ZIAC) 5-6.25 MG tablet Take 1 tablet by mouth daily.   levothyroxine (SYNTHROID) 75 MCG tablet Take 1 tablet (75 mcg total) by mouth daily before breakfast.   memantine (NAMENDA) 5 MG tablet Take 2 tablets (10 mg total) by mouth 2 (two) times daily.   QUEtiapine (SEROQUEL) 100 MG tablet Take 1 tablet (100 mg total) by mouth at bedtime.   No facility-administered encounter medications on file as of 04/10/2022.      Medical History: Past Medical History:  Diagnosis Date   Arthritis    Chorea    COPD (chronic obstructive pulmonary disease) ([place])    Diabetes mellitus without complication (HCC)    Ear infection    Environmental allergies    GERD (gastroesophageal reflux disease)    Hx of rheumatic fever    Hx: UTI (urinary tract infection)    Hyperlipidemia    Hyperlipidemia    Hypertension    Hypothyroidism    Kidney disease    Lung cancer ([place])    Mini stroke    Stroke ([place])    x's 2   Vertigo      Vital Signs: BP (!) 132/55   Pulse 70   Temp 98.4 F (36.9 C)   Resp 16   Ht '5\' 2"'  (1.575 m)   Wt 112 lb (50.8 kg)   SpO2 98%   BMI 20.49 kg/m    Review of Systems  Constitutional:  Positive for activity change, fatigue and unexpected weight change. Negative for chills.  HENT:  Positive for postnasal drip. Negative for congestion, rhinorrhea, sneezing and sore throat.   Eyes: Negative.  Negative for redness.   Respiratory:  Positive for shortness of breath. Negative for cough, chest tightness and wheezing.   Cardiovascular: Negative.  Negative for chest pain and palpitations.  Gastrointestinal: Negative.  Negative for abdominal pain, constipation, diarrhea, nausea and vomiting.  Genitourinary: Negative.  Negative for dysuria and frequency.  Musculoskeletal:  Positive for back pain. Negative for arthralgias, joint swelling and neck pain.  Skin: Negative.  Negative for rash.  Neurological:  Positive for dizziness, weakness and light-headedness. Negative for tremors and numbness.  Hematological:  Negative for adenopathy. Bruises/bleeds easily.  Psychiatric/Behavioral:  Positive for agitation, behavioral problems (Depression), confusion and sleep disturbance. Negative for self-injury and suicidal ideas. The patient is nervous/anxious.    Physical Exam Vitals reviewed.  Constitutional:      Appearance: Normal appearance. She is normal weight. She is ill-appearing.  HENT:     Head: Normocephalic and atraumatic.  Cardiovascular:     Rate and Rhythm: Normal rate and regular rhythm.  Pulmonary:     Effort: Pulmonary effort is normal. No respiratory distress.  Neurological:     Mental Status: She is lethargic, disoriented and confused.     Sensory: Sensory deficit present.     Motor: Weakness present.     Coordination: Coordination abnormal.     Gait: Gait abnormal.  Psychiatric:        Mood and Affect: Affect is blunt and inappropriate.        Speech: Speech is delayed.        Behavior: Behavior is slowed. Behavior is cooperative.        Cognition and Memory: Memory is impaired.      Assessment/Plan: 1. Declining functional status Palliative care referral for hospice evaluation, ensure patient comfort and to discuss code status and advanced care planning. Urgent referral ordered 04/14/22, will have it set up tomorrow 04/15/22 - Amb Referral to Palliative Care  2. Decreased appetite See  problem #1 and #5 - CBC with Differential/Platelet - B12 and Folate Panel - CMP14+EGFR - TSH + free T4 - Amb Referral to Palliative Care  3. Generalized muscle weakness See problem #1 - CBC with Differential/Platelet - B12 and Folate Panel - CMP14+EGFR - TSH + free T4 - Amb Referral to Palliative Care  4. Hypersomnia CT head to rule out acute injury, labs ordered. Palliative care referral for further evaluation.  - CT HEAD WO CONTRAST (5MM); Future - CBC with Differential/Platelet - B12 and Folate Panel - CMP14+EGFR - TSH + free T4 - Amb Referral to Palliative Care  5. Late onset Alzheimer's disease with behavioral disturbance ([place]) Urgent referral to palliative care due to continuing functional decline, decreased appetite, hypersomnia and generalized weakness.  - ALPRAZolam (XANAX) 0.25 MG tablet; Take 1 tablet (0.25 mg total) by mouth at bedtime as needed for anxiety.  Dispense: 30 tablet; Refill: 2 - QUEtiapine (SEROQUEL) 100 MG tablet; Take 1 tablet (100 mg total) by mouth at bedtime.  Dispense: 90 tablet; Refill: 1 - memantine (NAMENDA) 5 MG tablet; Take 2 tablets (10 mg total) by mouth 2 (two) times daily.  Dispense: 360 tablet; Refill: 1 - Amb Referral to Palliative Care  6. Minor head trauma CT head to rule out acute injury from fall, additional labs ordered.  - CT HEAD WO CONTRAST (5MM); Future - CBC with Differential/Platelet - B12 and Folate Panel - CMP14+EGFR - TSH + free T4  7. History of recent fall CT head to rule out acute injury related to fall, additional labs ordered.  - CT HEAD WO CONTRAST (5MM); Future - CBC with Differential/Platelet - B12 and Folate Panel - CMP14+EGFR - TSH + free T4  8. Acquired hypothyroidism Refill ordered.  - levothyroxine (SYNTHROID) 75 MCG tablet; Take 1 tablet (75 mcg total) by mouth daily before breakfast.  Dispense: 90 tablet; Refill: 1  9. Type 2 diabetes mellitus with hyperglycemia, with long-term current use of  insulin (HCC) A1C is stable, no significant change.  - POCT HgB A1C   General Counseling: Honest verbalizes understanding of the findings of todays visit and agrees with plan  of treatment. I have discussed any further diagnostic evaluation that may be needed or ordered today. We also reviewed her medications today. she has been encouraged to call the office with any questions or concerns that should arise related to todays visit.    Counseling:    Orders Placed This Encounter  Procedures   CT HEAD WO CONTRAST (5MM)   CBC with Differential/Platelet   B12 and Folate Panel   CMP14+EGFR   TSH + free T4   POCT HgB A1C    Meds ordered this encounter  Medications   ALPRAZolam (XANAX) 0.25 MG tablet    Sig: Take 1 tablet (0.25 mg total) by mouth at bedtime as needed for anxiety.    Dispense:  30 tablet    Refill:  2   bisoprolol-hydrochlorothiazide (ZIAC) 5-6.25 MG tablet    Sig: Take 1 tablet by mouth daily.    Dispense:  90 tablet    Refill:  1   QUEtiapine (SEROQUEL) 100 MG tablet    Sig: Take 1 tablet (100 mg total) by mouth at bedtime.    Dispense:  90 tablet    Refill:  1   memantine (NAMENDA) 5 MG tablet    Sig: Take 2 tablets (10 mg total) by mouth 2 (two) times daily.    Dispense:  360 tablet    Refill:  1    Please fill as 90 day prescription   levothyroxine (SYNTHROID) 75 MCG tablet    Sig: Take 1 tablet (75 mcg total) by mouth daily before breakfast.    Dispense:  90 tablet    Refill:  1    Return in about 3 months (around 07/11/2022) for F/U, Canna Nickelson PCP and will call results of head CT and labs ordered today. .  [place] Controlled Substance Database was reviewed by me for overdose risk score (ORS)  Time spent:30 Minutes Time spent with patient included reviewing progress notes, labs, imaging studies, and discussing plan for follow up.   This patient was seen by Jonetta Osgood, FNP-C in collaboration with Dr. Clayborn Bigness as a part of collaborative care  agreement.  Ritika Hellickson R. Valetta Fuller, MSN, FNP-C Internal Medicine

## 2022-04-11 NOTE — Progress Notes (Signed)
Please call the patient 's family member contact and let her know that the CT head did not show any acute changes related to hitting her head when she fell. There are chronic small vessel changes and atrophy which is most likely related to dementia.

## 2022-04-12 ENCOUNTER — Telehealth: Payer: Self-pay

## 2022-04-12 NOTE — Telephone Encounter (Signed)
Spoke to pt's daughter, provided results

## 2022-04-12 NOTE — Telephone Encounter (Signed)
-----   Message from Jonetta Osgood, NP sent at 04/11/2022  6:53 AM EDT ----- Please call the patient 's family member contact and let her know that the CT head did not show any acute changes related to hitting her head when she fell. There are chronic small vessel changes and atrophy which is most likely related to dementia.

## 2022-04-15 ENCOUNTER — Telehealth: Payer: Self-pay | Admitting: Student

## 2022-04-15 ENCOUNTER — Telehealth: Payer: Self-pay

## 2022-04-15 NOTE — Telephone Encounter (Signed)
Spoke with patient's son Samantha Franco, regarding the Palliative referral/services and he was in agreement with beginning services with [place].  I have scheduled an In-home Consult for 04/16/22 @ 10:30 AM.

## 2022-04-15 NOTE — Addendum Note (Signed)
Addended by: Corlis Hove on: 04/15/2022 09:15 AM   Modules accepted: Orders

## 2022-04-15 NOTE — Telephone Encounter (Signed)
Spoke with denise for [facility] palliative care that we put referral in epic she will take care and also lmom to son to call us back

## 2022-04-16 ENCOUNTER — Other Ambulatory Visit: Payer: Medicare Other | Admitting: Student

## 2022-04-16 DIAGNOSIS — F0280 Dementia in other diseases classified elsewhere without behavioral disturbance: Secondary | ICD-10-CM

## 2022-04-16 DIAGNOSIS — W19XXXA Unspecified fall, initial encounter: Secondary | ICD-10-CM | POA: Diagnosis not present

## 2022-04-16 DIAGNOSIS — E1165 Type 2 diabetes mellitus with hyperglycemia: Secondary | ICD-10-CM

## 2022-04-16 DIAGNOSIS — R531 Weakness: Secondary | ICD-10-CM

## 2022-04-16 DIAGNOSIS — M545 Low back pain, unspecified: Secondary | ICD-10-CM

## 2022-04-16 DIAGNOSIS — Z515 Encounter for palliative care: Secondary | ICD-10-CM

## 2022-04-16 NOTE — Progress Notes (Addendum)
[facility] Consult Note Telephone: [phone]  Fax: [phone]   Date of encounter: 04/16/22 11:17 AM PATIENT NAME: Samantha Franco [address] [place] [address]   [phone] (home) [phone] (work) DOB: 1929-02-24 MRN: [id number] PRIMARY CARE PROVIDER:    Jonetta Osgood, NP,  [place] [address] [phone]  REFERRING PROVIDER:   Jonetta Osgood, [place],  [place] [address] [phone]  RESPONSIBLE PARTY:    Contact Information     Name Relation Home Work Mobile   [place] Daughter   [phone]   Mussa,Tim & Vernell Leep [phone]  [phone]   Emelda Brothers Niece   [phone]   Michell Heinrich Sister [phone]     Mcconaha,Lauren Granddaughter   [phone]   Katina, Remick   [phone]        I met face to face with patient and family in the home. Palliative Care was asked to follow this patient by consultation request of  Jonetta Osgood, NP to address advance care planning and complex medical decision making. This is the initial visit.                                     ASSESSMENT AND PLAN / RECOMMENDATIONS:   Advance Care Planning/Goals of Care: Goals include to maximize quality of life and symptom management. Patient/health care surrogate gave his/her permission to discuss.Our advance care planning conversation included a discussion about:    The value and importance of advance care planning  Experiences with loved ones who have been seriously ill or have died  Exploration of personal, cultural or spiritual beliefs that might influence medical decisions  Exploration of goals of care in the event of a sudden injury or illness  Son [name] is Economist; daughter is secondary.  Discussed code status; family to continue discussing.  CODE STATUS: TBD; will discuss with her brother.   Education provided on Palliative Medicine vs. Hospice care.  Patient is needing additional support in the home. Would like to limit hospitalizations.   Symptom Management/Plan:  Lower back pain-start acetaminophen 1000 mg TID, lidocaine patch 4 % apply to lower back, on for 12 hours/ off 12 hours. Has tramadol ordered PRN; sister is going to check with her brother regarding location of medicine as they have a lock box. Request imaging of back/spine in home d/t recent fall and worsening back pain, difficulty ambulating.   Late onset Alzheimer's Dementia- reorient and redirect as needed. Caregivers to assist with adl's. Monitor for falls/safety. Continue Namenda, quetiapine as directed. Patient with increased difficulty ambulating, transferring.   DME Request:  Standard wheel chair: Patient requires wheel chair due to diagnoses of Alzheimer's dementia, gait instability, LE weakness. Patient has mobility limitations that prevents her from completing ADL's, the patient has a caregiver present at all times that is willing and able to assist in propelling wheelchair.  Bedside commode: patient is confined to one floor without access to bathroom requiring need for bedside commode due to gait instability, generalized weakness.   T2DM-last hemoglobin A1c 6.1; taking 10-12 units of Levimir daily. Defer to PCP; patient with poor appetite; would likely need reduction in insulin. Family and  Caregivers checking blood sugar BID.  Generalized weakness-patient with increased weakness. Currently using walker for ambulation. Recommend w/c for locomotion when having difficulty ambulating.   Follow up Palliative Care Visit: Palliative care will continue to follow for  complex medical decision making, advance care planning, and clarification of goals. Return in 2 weeks or prn.   This visit was coded based on medical decision making (MDM).  PPS: 40%, weak  HOSPICE ELIGIBILITY/DIAGNOSIS: TBD  Chief Complaint: Palliative initial consult.   HISTORY OF PRESENT ILLNESS:   Samantha Franco is a 88 y.o. year old female  with late onset alzheimer's dementia, anxiety, T2DM, hypothyroidism, cervical spondylosis, essential hypertension, hx of lung cancer.  Increased weakness, two falls back to back in past 2 weeks. Complaint of back and buttocks pain. Patient did have a head strike with one of the falls; CT scan showed changes consistent with dementia; no acute findings. Two caregivers, alternating a week at at time. Eating bites of 3 meals a day. No nutritional supplements as she does not like them. Needing much encouragement to eat. Needs assistance x 1 with all adl's. Able to dress herself and perform adl's up until a couple weeks ago.  Now ambulating with a walker. Family reports increased changes in her speech, more difficult to understand. Less engaged. Talking to people not present, hallucinating in the past week. Shortness of breath with exertion. Am blood sugars vary; lowest in 70's; evening blood sugars usually 130's-154; last night blood sugar 271 last night. Patient sleeping around 20-22 hours a day. Daughter reports hx of lung cancer with lobectomy around 20 years ago. Patient with caregivers since 2019.  Patient received resting in recliner. She has her breakfast in front of her; she has eaten only a couple of bites. She does answer direct questions, but goes back to sleep most of the visit. Daughter lives in [place], son lives in [place].    History obtained from review of EMR, discussion with primary team, and interview with family, facility staff/caregiver and/or [name].  I reviewed available labs, medications, imaging, studies and related documents from the EMR.  Records reviewed and summarized above.    Physical Exam: Last weight 112 pounds; around 130 pounds in November per family. Pulse 68, resp 16, b/p 120/50, sats 97% on room air Constitutional: NAD General: frail appearing, thin EYES: anicteric sclera, lids intact, no discharge  ENMT:  intact hearing, dentition intact CV: S1S2, RRR, no LE edema Pulmonary: LCTA, no increased work of breathing, no cough, room air Abdomen: normo-active BS + 4 quadrants, soft and non tender, no ascites GU: deferred MSK: sarcopenia, moves all extremities, ambulatory Skin: warm and dry, no rashes or wounds on visible skin Neuro:  + generalized weakness, A & O x 2, somnolence Psych: non-anxious affect Hem/lymph/immuno: no widespread bruising CURRENT PROBLEM LIST:  Patient Active Problem List   Diagnosis Date Noted   Syncope and collapse 10/05/2021   Syncope    Mucositis oral 02/20/2020   Gingivitis 02/20/2020   Diabetes mellitus, type II ([place]) 02/20/2020   GAD (generalized anxiety disorder) 02/20/2020   Encounter for general adult medical examination with abnormal findings 08/21/2019   Uncontrolled type 2 diabetes mellitus with hyperglycemia ([place]) 08/21/2019   Atopic dermatitis 08/21/2019   Acquired hypothyroidism 08/21/2019   Late onset Alzheimer's disease without behavioral disturbance ([place]) 08/21/2019   Dysuria 08/21/2019   White matter disease 02/06/2018   Bursitis of hip 12/17/2017   Osteoarthritis of knee 12/17/2017   Primary localized osteoarthritis of pelvic region and thigh 12/17/2017   Cervical spondylosis with myelopathy 07/23/2017   Closed nondisplaced fracture of second cervical vertebra with routine healing 07/23/2017   CAP (community acquired pneumonia) 11/11/2015   Pain in the chest  03/21/2015   Essential hypertension, benign 03/21/2015   Malignant neoplasm of unspecified part of unspecified bronchus or lung ([place]) 02/14/2014   Chronic cystitis 08/05/2013   Chronic kidney disease, stage II (mild) 08/05/2013   Incomplete emptying of bladder 08/05/2013   Kidney stone 08/05/2013   Microscopic hematuria 08/05/2013   Mixed incontinence 36/46/8032   Uncertain tumor of kidney and ureter 08/05/2013   PAST MEDICAL HISTORY:  Active Ambulatory Problems    Diagnosis Date  Noted   Pain in the chest 03/21/2015   Essential hypertension, benign 03/21/2015   Bursitis of hip 12/17/2017   CAP (community acquired pneumonia) 11/11/2015   Cervical spondylosis with myelopathy 07/23/2017   Chronic cystitis 08/05/2013   Chronic kidney disease, stage II (mild) 08/05/2013   Closed nondisplaced fracture of second cervical vertebra with routine healing 07/23/2017   Incomplete emptying of bladder 08/05/2013   Kidney stone 08/05/2013   Malignant neoplasm of unspecified part of unspecified bronchus or lung ([place]) 02/14/2014   Microscopic hematuria 08/05/2013   Mixed incontinence 08/05/2013   Osteoarthritis of knee 12/17/2017   Primary localized osteoarthritis of pelvic region and thigh 11/03/8249   Uncertain tumor of kidney and ureter 08/05/2013   White matter disease 02/06/2018   Encounter for general adult medical examination with abnormal findings 08/21/2019   Uncontrolled type 2 diabetes mellitus with hyperglycemia ([place]) 08/21/2019   Atopic dermatitis 08/21/2019   Acquired hypothyroidism 08/21/2019   Late onset Alzheimer's disease without behavioral disturbance ([place]) 08/21/2019   Dysuria 08/21/2019   Mucositis oral 02/20/2020   Gingivitis 02/20/2020   Diabetes mellitus, type II ([place]) 02/20/2020   GAD (generalized anxiety disorder) 02/20/2020   Syncope    Syncope and collapse 10/05/2021   Resolved Ambulatory Problems    Diagnosis Date Noted   No Resolved Ambulatory Problems   Past Medical History:  Diagnosis Date   Arthritis    Chorea    COPD (chronic obstructive pulmonary disease) ([place])    Diabetes mellitus without complication ([place])    Ear infection    Environmental allergies    GERD (gastroesophageal reflux disease)    Hx of rheumatic fever    Hx: UTI (urinary tract infection)    Hyperlipidemia    Hyperlipidemia    Hypertension    Hypothyroidism    Kidney disease    Lung cancer ([place])    Mini stroke    Stroke ([place])    Vertigo    SOCIAL HX:   Social History   Tobacco Use   Smoking status: Former    Types: Cigarettes   Smokeless tobacco: Former    Types: Chew    Quit date: 06/05/2020   Tobacco comments:    Quit X30 years ago  Substance Use Topics   Alcohol use: No   FAMILY HX:  Family History  Problem Relation Age of Onset   Kidney disease Mother    Cancer Mother    Arthritis Mother    Heart attack Father    Heart disease Father    Arthritis Sister    ADD / ADHD Son       ALLERGIES:  Allergies  Allergen Reactions   Sulfa Antibiotics    Vasotec [Enalapril] Swelling     PERTINENT MEDICATIONS:  Outpatient Encounter Medications as of 04/16/2022  Medication Sig   ALPRAZolam (XANAX) 0.25 MG tablet Take 1 tablet (0.25 mg total) by mouth at bedtime as needed for anxiety.   aspirin 325 MG tablet Take 325 mg by mouth daily.   bisoprolol-hydrochlorothiazide [facility])  5-6.25 MG tablet Take 1 tablet by mouth daily.   Continuous Blood Gluc Receiver (FREESTYLE LIBRE 2 READER) DEVI Use as directed DX E11.65   Continuous Blood Gluc Sensor MISC Use as directed every 14 days. May dispense FreeStyle [employer] or similar. diag E11.65   docusate sodium (COLACE) 100 MG capsule Take 100 mg by mouth at bedtime.   glucose blood (ACCU-CHEK AVIVA PLUS) test strip USE 1 STRIP TO CHECK GLUCOSE TWICE DAILY diag E11.65   insulin detemir (LEVEMIR FLEXTOUCH) 100 UNIT/ML FlexPen Inject 12 Units into the skin at bedtime.   Insulin Pen Needle (BD PEN NEEDLE NANO 2ND GEN) 32G X 4 MM MISC Use as directed with insulin   levothyroxine (SYNTHROID) 75 MCG tablet Take 1 tablet (75 mcg total) by mouth daily before breakfast.   memantine (NAMENDA) 5 MG tablet Take 2 tablets (10 mg total) by mouth 2 (two) times daily.   QUEtiapine (SEROQUEL) 100 MG tablet Take 1 tablet (100 mg total) by mouth at bedtime.   terbinafine (LAMISIL) 1 % cream Apply 1 application topically 2 (two) times daily.   traMADol (ULTRAM) 50 MG tablet TAKE 1 TABLET BY MOUTH EVERY  8 HOURS AS NEEDED FOR MODERATE PAIN   No facility-administered encounter medications on file as of 04/16/2022.   Thank you for the opportunity to participate in the care of [name].  The palliative care team will continue to follow. Please call our office at [phone] if we can be of additional assistance.   Ezekiel Slocumb, NP   COVID-19 PATIENT SCREENING TOOL Asked and negative response unless otherwise noted:  Have you had symptoms of covid, tested positive or been in contact with someone with symptoms/positive test in the past 5-10 days? No

## 2022-04-17 ENCOUNTER — Telehealth: Payer: Self-pay | Admitting: Nurse Practitioner

## 2022-04-17 ENCOUNTER — Other Ambulatory Visit: Payer: Self-pay

## 2022-04-17 ENCOUNTER — Telehealth: Payer: Self-pay

## 2022-04-17 ENCOUNTER — Emergency Department
Admission: EM | Admit: 2022-04-17 | Discharge: 2022-04-17 | Disposition: A | Discharge status: Home / Self Care | Payer: Medicare Other | Attending: Emergency Medicine | Admitting: Emergency Medicine

## 2022-04-17 DIAGNOSIS — F0390 Unspecified dementia without behavioral disturbance: Secondary | ICD-10-CM | POA: Diagnosis not present

## 2022-04-17 DIAGNOSIS — R4182 Altered mental status, unspecified: Secondary | ICD-10-CM | POA: Diagnosis present

## 2022-04-17 DIAGNOSIS — R9431 Abnormal electrocardiogram [ECG] [EKG]: Secondary | ICD-10-CM | POA: Diagnosis not present

## 2022-04-17 DIAGNOSIS — R400 Somnolence: Secondary | ICD-10-CM | POA: Diagnosis not present

## 2022-04-17 DIAGNOSIS — R531 Weakness: Secondary | ICD-10-CM | POA: Diagnosis not present

## 2022-04-17 DIAGNOSIS — R5381 Other malaise: Secondary | ICD-10-CM | POA: Diagnosis not present

## 2022-04-17 DIAGNOSIS — I959 Hypotension, unspecified: Secondary | ICD-10-CM | POA: Diagnosis not present

## 2022-04-17 DIAGNOSIS — E1165 Type 2 diabetes mellitus with hyperglycemia: Secondary | ICD-10-CM

## 2022-04-17 LAB — CBC WITH DIFFERENTIAL/PLATELET
Abs Immature Granulocytes: 0.04 10*3/uL (ref 0.00–0.07)
Basophils Absolute: 0.1 10*3/uL (ref 0.0–0.1)
Basophils Relative: 1 %
Eosinophils Absolute: 0.4 10*3/uL (ref 0.0–0.5)
Eosinophils Relative: 5 %
HCT: 41.4 % (ref 36.0–46.0)
Hemoglobin: 13 g/dL (ref 12.0–15.0)
Immature Granulocytes: 1 %
Lymphocytes Relative: 23 %
Lymphs Abs: 1.9 10*3/uL (ref 0.7–4.0)
MCH: 28.2 pg (ref 26.0–34.0)
MCHC: 31.4 g/dL (ref 30.0–36.0)
MCV: 89.8 fL (ref 80.0–100.0)
Monocytes Absolute: 0.6 10*3/uL (ref 0.1–1.0)
Monocytes Relative: 8 %
Neutro Abs: 5.2 10*3/uL (ref 1.7–7.7)
Neutrophils Relative %: 62 %
Platelets: 291 10*3/uL (ref 150–400)
RBC: 4.61 MIL/uL (ref 3.87–5.11)
RDW: 13.8 % (ref 11.5–15.5)
WBC: 8.1 10*3/uL (ref 4.0–10.5)
nRBC: 0 % (ref 0.0–0.2)

## 2022-04-17 LAB — LIPASE, BLOOD: Lipase: 24 U/L (ref 11–51)

## 2022-04-17 LAB — COMPREHENSIVE METABOLIC PANEL
ALT: 7 U/L (ref 0–44)
AST: 15 U/L (ref 15–41)
Albumin: 3.3 g/dL — ABNORMAL LOW (ref 3.5–5.0)
Alkaline Phosphatase: 47 U/L (ref 38–126)
Anion gap: 7 (ref 5–15)
BUN: 45 mg/dL — ABNORMAL HIGH (ref 8–23)
CO2: 30 mmol/L (ref 22–32)
Calcium: 9.3 mg/dL (ref 8.9–10.3)
Chloride: 106 mmol/L (ref 98–111)
Creatinine, Ser: 1.53 mg/dL — ABNORMAL HIGH (ref 0.44–1.00)
GFR, Estimated: 32 mL/min — ABNORMAL LOW (ref >60)
Glucose, Bld: 64 mg/dL — ABNORMAL LOW (ref 70–99)
Potassium: 3.4 mmol/L — ABNORMAL LOW (ref 3.5–5.1)
Sodium: 143 mmol/L (ref 135–145)
Total Bilirubin: 0.5 mg/dL (ref 0.3–1.2)
Total Protein: 6.9 g/dL (ref 6.5–8.1)

## 2022-04-17 LAB — LACTIC ACID, PLASMA: Lactic Acid, Venous: 0.9 mmol/L (ref 0.5–1.9)

## 2022-04-17 MED ORDER — LEVEMIR FLEXTOUCH 100 UNIT/ML ~~LOC~~ SOPN: 10.0000 [IU] | PEN_INJECTOR | Freq: Every day | SUBCUTANEOUS | 3 refills | Status: AC

## 2022-04-17 NOTE — TOC Initial Note (Signed)
Transition of Care [facility]) - Initial/Assessment Note    Patient Details  Name: Samantha Franco MRN: [id number] Date of Birth: Sep 15, 1929  Transition [facility]) CM/SW Contact:    Shelbie Hutching, RN Phone Number: 04/17/2022, 3:19 PM  Clinical Narrative:                 Patient brought into the emergency room for decline, patient sleeping most of the time and not eating.  RNCM met with patient and patient's son, Samantha Franco, at the bedside.  Patient will open eyes and say hi but then quickly returns to sleep.  Son reports that patient has been sleeping for 20+ hours per day since November.  OP palliative came out for their first visit yesterday, they were going to starting getting some things together like ordering a wheelchair.   RNCM reached out to [facility] with [facility]- she will get in touch with OP palliative to follow closely, patient may be more hospice appropriate.  Son is worried about care at home, she has 24/7 caregivers but they are not agency and he reports that they can be unreliable.  He also reports that she has been needing more assistance not being able to bear weight. RNCM provided patient with information on [facility].  Abigale Hedgecock with [facility] called and talked with her about referral.  I gave the son Abigale's number and he can call for more information.   H[place] instructed son, Samantha Franco to follow up with PCP, PCP Dr. Humphrey Rolls can fill out LTC [facility] form if placement is what he decides on.  He understands that SNF LTC will be private pay and that ranges from $05-7999/ month.    Patient will be discharged from the ED back home.  Son will transport her home.   Expected Discharge Plan: Home/Self Care Barriers to Discharge: Barriers Resolved   Patient Goals and CMS Choice Patient states their goals for this hospitalization and ongoing recovery are:: Son is interested in LTC CMS Medicare.gov Compare Post Acute Care list provided to:: Patient Represenative (must  comment) Choice offered to / list presented to : Adult Children  Expected Discharge Plan and Services Expected Discharge Plan: Home/Self Care   Discharge Planning Services: CM Consult   Living arrangements for the past 2 months: Single Family Home                 DME Arranged: N/A DME Agency: NA       HH Arranged: NA          Prior Living Arrangements/Services Living arrangements for the past 2 months: Single Family Home Lives with:: Self Patient language and need for interpreter reviewed:: Yes Do you feel safe going back to the place where you live?: Yes      Need for Family Participation in Patient Care: Yes (Comment) Care giver support system in place?: Yes (comment) (family) Current home services: Homehealth aide, Other (comment), DME (24/7 caregivers at home- OP Palliative) Criminal Activity/Legal Involvement Pertinent to Current Situation/Hospitalization: No - Comment as needed  Activities of Daily Living      Permission Sought/Granted Permission sought to share information with : Case Manager, Family Supports, Chartered certified accountant granted to share information with : Yes, Verbal Permission Granted  Share Information with NAME: Franne Grip, Rheta Hemmelgarn- daughter and son  Permission granted to share info w AGENCY: [facility], [place]        Emotional Assessment Appearance:: Appears stated age Attitude/Demeanor/Rapport:  Lethargic Affect (typically observed): Pleasant Orientation: : Oriented to Self Alcohol / Substance Use: Not Applicable Psych Involvement: No (comment)  Admission diagnosis:  FTT, EMS Patient Active Problem List   Diagnosis Date Noted   Syncope and collapse 10/05/2021   Syncope    Mucositis oral 02/20/2020   Gingivitis 02/20/2020   Diabetes mellitus, type II ([place]) 02/20/2020   GAD (generalized anxiety disorder) 02/20/2020   Encounter for general adult medical examination with abnormal findings 08/21/2019    Uncontrolled type 2 diabetes mellitus with hyperglycemia ([place]) 08/21/2019   Atopic dermatitis 08/21/2019   Acquired hypothyroidism 08/21/2019   Late onset Alzheimer's disease without behavioral disturbance ([place]) 08/21/2019   Dysuria 08/21/2019   White matter disease 02/06/2018   Bursitis of hip 12/17/2017   Osteoarthritis of knee 12/17/2017   Primary localized osteoarthritis of pelvic region and thigh 12/17/2017   Cervical spondylosis with myelopathy 07/23/2017   Closed nondisplaced fracture of second cervical vertebra with routine healing 07/23/2017   CAP (community acquired pneumonia) 11/11/2015   Pain in the chest 03/21/2015   Essential hypertension, benign 03/21/2015   Malignant neoplasm of unspecified part of unspecified bronchus or lung ([place]) 02/14/2014   Chronic cystitis 08/05/2013   Chronic kidney disease, stage II (mild) 08/05/2013   Incomplete emptying of bladder 08/05/2013   Kidney stone 08/05/2013   Microscopic hematuria 08/05/2013   Mixed incontinence 69/86/1483   Uncertain tumor of kidney and ureter 08/05/2013   PCP:  Lavera Guise, MD Pharmacy:   [facility] [address], [place] [address] [place] [address] Phone: [phone] Fax: [phone]  [id number] [id number]: [phone]: [phone]    Social Determinants of Health (SDOH) Interventions    Readmission Risk Interventions     View : No data to display.

## 2022-04-17 NOTE — Telephone Encounter (Signed)
I spoke with the patient's daughter via telephone call regarding recommendations from the nurse practitioner that visited the home today with palliative care.  Due to the patient's decreased appetite and fluctuating glucose levels, her recommendation was to decrease the Levemir and the patient's daughter wanted to call and discussed that today.  The patient's blood glucose levels seem to drop mostly in the daytime/morning and are spiking in the evening and at night.  She has been taking Levemir 12 units at bedtime.  After discussing with the patient's daughter, it was decided to decrease the dose to 10 units and changed the administration time to midday or around noon to provide better control of the glucose levels in the evening and nighttime and help prevent her from having low sugars in the morning. Overall the patient's daughter stated that the visit from palliative care went well.  The patient does not qualify for hospice care yet but palliative care team plans to follow the patient closely and visit her again in 2 weeks. As primary care provider for the patient, I will provide what ever support or assistance the patient may need that I am able to provide.  The patient's daughter was encouraged to communicate via MyChart or phone call if if they need anything for the patient and encouraged her to let the patient's son to him know the same.  1. Type 2 diabetes mellitus with hyperglycemia, with long-term current use of insulin (HCC) Levemir dose decreased, administration time adjusted to midday around noon. This will depend on the patient's schedule when she is awake and when she eats as well.  - insulin detemir (LEVEMIR FLEXTOUCH) 100 UNIT/ML FlexPen; Inject 10 Units into the skin daily at 12 noon. (Midday or early afternoon is ok)  Dispense: 15 mL; Refill: 3

## 2022-04-17 NOTE — Progress Notes (Addendum)
[facility] ED Manufacturing engineer [facility]) Hospital Liaison note:  This patient is currently enrolled in [facility] outpatient-based Palliative Care. Will continue to follow for disposition.  Please call with any outpatient palliative questions or concerns.  Thank you, Lorelee Market, LPN [facility] Liaison [phone]

## 2022-04-17 NOTE — Telephone Encounter (Signed)
Pt daughter betsy called that pt care giver called her that pt is not alert(lethargic )her Bp 127/84 pulse 50 and her glucose 64  as per alyssa advised her call 911 ASAP

## 2022-04-17 NOTE — Telephone Encounter (Signed)
Spoke to caregiver Rip Harbour [phone]) and she advised that the 911 EMS was on the way. She advised that pt was alert now but still not wanting to get out of bed.  We asked about her Blood sugar being low and advised that she could maybe give pt some juice or something with sugar in it but the EMS was already coming in the house

## 2022-04-17 NOTE — ED Triage Notes (Signed)
[facility] reports pt coming from home. Family states for the past two weeks they have seen a decline in pt with it progressively getting worse. Pt has not been eating and drinking normally. Pt does have Dementia. Normally she is up walking and talking and not doing that currently.

## 2022-04-18 ENCOUNTER — Encounter: Payer: Self-pay | Admitting: Nurse Practitioner

## 2022-04-18 ENCOUNTER — Other Ambulatory Visit
Admission: RE | Admit: 2022-04-18 | Discharge: 2022-04-18 | Disposition: A | Discharge status: Home / Self Care | Payer: Medicare Other | Source: Ambulatory Visit | Attending: Nurse Practitioner | Admitting: Nurse Practitioner

## 2022-04-18 ENCOUNTER — Ambulatory Visit (INDEPENDENT_AMBULATORY_CARE_PROVIDER_SITE_OTHER): Payer: Medicare Other | Admitting: Nurse Practitioner

## 2022-04-18 VITALS — BP 140/62 | HR 67 | Temp 98.4°F | Resp 16 | Ht 62.0 in | Wt 112.0 lb

## 2022-04-18 DIAGNOSIS — E1165 Type 2 diabetes mellitus with hyperglycemia: Secondary | ICD-10-CM

## 2022-04-18 DIAGNOSIS — Z111 Encounter for screening for respiratory tuberculosis: Secondary | ICD-10-CM

## 2022-04-18 DIAGNOSIS — R5381 Other malaise: Secondary | ICD-10-CM

## 2022-04-18 DIAGNOSIS — R630 Anorexia: Secondary | ICD-10-CM

## 2022-04-18 DIAGNOSIS — Z515 Encounter for palliative care: Secondary | ICD-10-CM

## 2022-04-18 DIAGNOSIS — Z794 Long term (current) use of insulin: Secondary | ICD-10-CM | POA: Diagnosis not present

## 2022-04-18 NOTE — Progress Notes (Signed)
[facility] [place], [place] [address]  Internal MEDICINE  Office Visit Note  Patient Name: Samantha Franco  [id number]  [id number]  Date of Service: 04/18/2022  Chief Complaint  Patient presents with   Follow-up    Sleeping constantly, low appetite   Diabetes   Gastroesophageal Reflux   Hypertension   Hyperlipidemia    HPI Samantha Franco presents for a follow up visit after going to the ER due to low appetite and possibly low sugar and being lethargic. Her glucose was low. With her not eating much, we recently started titrating down on her levemir. She was recently seen at her home residence by palliative care, [facility]. Their recommendation was to decrease the levemir so we did but it was not decreased enough to prevent her glucose from dropping.      Current Medication: Outpatient Encounter Medications as of 04/18/2022  Medication Sig   ALPRAZolam (XANAX) 0.25 MG tablet Take 1 tablet (0.25 mg total) by mouth at bedtime as needed for anxiety.   aspirin 325 MG tablet Take 325 mg by mouth daily.   bisoprolol-hydrochlorothiazide (ZIAC) 5-6.25 MG tablet Take 1 tablet by mouth daily.   Continuous Blood Gluc Receiver (FREESTYLE LIBRE 2 READER) DEVI Use as directed DX E11.65   Continuous Blood Gluc Sensor MISC Use as directed every 14 days. May dispense FreeStyle [employer] or similar. diag E11.65   docusate sodium (COLACE) 100 MG capsule Take 100 mg by mouth at bedtime.   glucose blood (ACCU-CHEK AVIVA PLUS) test strip USE 1 STRIP TO CHECK GLUCOSE TWICE DAILY diag E11.65   insulin detemir (LEVEMIR FLEXTOUCH) 100 UNIT/ML FlexPen Inject 10 Units into the skin daily at 12 noon. (Midday or early afternoon is ok)   Insulin Pen Needle (BD PEN NEEDLE NANO 2ND GEN) 32G X 4 MM MISC Use as directed with insulin   levothyroxine (SYNTHROID) 75 MCG tablet Take 1 tablet (75 mcg total) by mouth daily before breakfast.   lidocaine 4 % SMARTSIG:Topical   memantine  (NAMENDA) 5 MG tablet Take 2 tablets (10 mg total) by mouth 2 (two) times daily.   QUEtiapine (SEROQUEL) 100 MG tablet Take 1 tablet (100 mg total) by mouth at bedtime.   terbinafine (LAMISIL) 1 % cream Apply 1 application topically 2 (two) times daily.   traMADol (ULTRAM) 50 MG tablet TAKE 1 TABLET BY MOUTH EVERY 8 HOURS AS NEEDED FOR MODERATE PAIN   [DISCONTINUED] gabapentin (NEURONTIN) 100 MG capsule Take 100 mg by mouth 3 (three) times daily.   No facility-administered encounter medications on file as of 04/18/2022.    Surgical History: Past Surgical History:  Procedure Laterality Date   CARDIAC CATHETERIZATION     [facility]   CATARACT EXTRACTION     LUNG LOBECTOMY     THYROID SURGERY      Medical History: Past Medical History:  Diagnosis Date   Arthritis    Chorea    COPD (chronic obstructive pulmonary disease) ([place])    Diabetes mellitus without complication (HCC)    Ear infection    Environmental allergies    GERD (gastroesophageal reflux disease)    Hx of rheumatic fever    Hx: UTI (urinary tract infection)    Hyperlipidemia    Hyperlipidemia    Hypertension    Hypothyroidism    Kidney disease    Lung cancer ([place])    Mini stroke    Stroke ([place])    x's 2   Vertigo  Family History: Family History  Problem Relation Age of Onset   Kidney disease Mother    Cancer Mother    Arthritis Mother    Heart attack Father    Heart disease Father    Arthritis Sister    ADD / ADHD Son     Social History   Socioeconomic History   Marital status: Widowed    Spouse name: Not on file   Number of children: Not on file   Years of education: Not on file   Highest education level: Not on file  Occupational History   Not on file  Tobacco Use   Smoking status: Former    Types: Cigarettes   Smokeless tobacco: Former    Types: Chew    Quit date: 06/05/2020   Tobacco comments:    Quit X30 years ago  Vaping Use   Vaping Use: Never used  Substance and Sexual Activity    Alcohol use: No   Drug use: No   Sexual activity: Not on file  Other Topics Concern   Not on file  Social History Narrative   Not on file   Social Determinants of Health   Financial Resource Strain: Not on file  Food Insecurity: Not on file  Transportation Needs: Not on file  Physical Activity: Not on file  Stress: Not on file  Social Connections: Not on file  Intimate Partner Violence: Not on file      Review of Systems  Constitutional:  Positive for activity change, fatigue and unexpected weight change. Negative for chills.  HENT:  Positive for postnasal drip. Negative for congestion, rhinorrhea, sneezing and sore throat.   Eyes: Negative.  Negative for redness.  Respiratory:  Positive for shortness of breath. Negative for cough, chest tightness and wheezing.   Cardiovascular: Negative.  Negative for chest pain and palpitations.  Gastrointestinal: Negative.  Negative for abdominal pain, constipation, diarrhea, nausea and vomiting.  Genitourinary: Negative.  Negative for dysuria and frequency.  Musculoskeletal:  Positive for back pain. Negative for arthralgias, joint swelling and neck pain.  Skin: Negative.  Negative for rash.  Neurological:  Positive for dizziness, weakness and light-headedness. Negative for tremors and numbness.  Hematological:  Negative for adenopathy. Bruises/bleeds easily.  Psychiatric/Behavioral:  Positive for agitation, behavioral problems (Depression), confusion and sleep disturbance. Negative for self-injury and suicidal ideas. The patient is nervous/anxious.     Vital Signs: BP 140/62   Pulse 67   Temp 98.4 F (36.9 C)   Resp 16   Ht 5\' 2"  (1.575 m)   Wt 112 lb (50.8 kg)   SpO2 99%   BMI 20.49 kg/m    Physical Exam Vitals reviewed.  Constitutional:      Appearance: Normal appearance. She is normal weight. She is ill-appearing.  HENT:     Head: Normocephalic and atraumatic.  Cardiovascular:     Rate and Rhythm: Normal rate and regular  rhythm.  Pulmonary:     Effort: Pulmonary effort is normal. No respiratory distress.  Neurological:     Mental Status: She is lethargic, disoriented and confused.     Sensory: Sensory deficit present.     Motor: Weakness present.     Coordination: Coordination abnormal.     Gait: Gait abnormal.  Psychiatric:        Mood and Affect: Affect is blunt and inappropriate.        Speech: Speech is delayed.        Behavior: Behavior is slowed. Behavior is cooperative.  Cognition and Memory: Memory is impaired.        Assessment/Plan: 1. Declining functional status DNR code status, see problem #2. Palliative care is following the patient as well.   2. Palliative care status Discussed patient's code status with patient's son and decided that it would be a good idea to change the patient to a DNR code status to prevent prolonged suffering and pain in the event of a cardiopulmonary arrest.  - DNR (Do Not Resuscitate)  3. Decreased appetite Patient's son and family members instructed to continue decreasing the levemir dose by 2 units if she keeps having low glucose levels. Once the dose drops to 2 units, they can stop and completely discontinue the levemir completely.   4. Encounter for screening for respiratory tuberculosis For placement in an assisted living center - QuantiFERON-TB Gold Plus  5. Type 2 diabetes mellitus with hyperglycemia, with long-term current use of insulin ([place]) See problem #3 for directions.    General Counseling: aixa corsello understanding of the findings of todays visit and agrees with plan of treatment. I have discussed any further diagnostic evaluation that may be needed or ordered today. We also reviewed her medications today. she has been encouraged to call the office with any questions or concerns that should arise related to todays visit.    Orders Placed This Encounter  Procedures   QuantiFERON-TB Gold Plus   DNR (Do Not Resuscitate)     No orders of the defined types were placed in this encounter.   Return in about 2 months (around 06/18/2022), or if symptoms worsen or fail to improve, for F/U can be video in a couple of months. .   Total time spent:30 Minutes Time spent includes review of chart, medications, test results, and follow up plan with the patient.   [place] Controlled Substance Database was reviewed by me.  This patient was seen by Jonetta Osgood, FNP-C in collaboration with Dr. Clayborn Bigness as a part of collaborative care agreement.   Mareli Antunes R. Valetta Fuller, MSN, FNP-C Internal medicine

## 2022-04-19 ENCOUNTER — Other Ambulatory Visit
Admission: RE | Admit: 2022-04-19 | Discharge: 2022-04-19 | Disposition: A | Discharge status: Home / Self Care | Payer: Medicare Other | Attending: Internal Medicine | Admitting: Internal Medicine

## 2022-04-19 DIAGNOSIS — Z111 Encounter for screening for respiratory tuberculosis: Secondary | ICD-10-CM | POA: Insufficient documentation

## 2022-04-20 NOTE — ED Provider Notes (Signed)
[facility] Provider Note   Event Date/Time   First MD Initiated Contact with Patient 04/17/22 1117     (approximate) History  Altered Mental Status  HPI Samantha Franco is a 88 y.o. female  Location: Generalized Duration: 2 weeks Timing: Worsening since onset Severity: Severe Quality: Somnolence Context: Family states over the past 2 weeks patient has had a decline in her mental status where she is no longer walking and talking as she normally does. Modifying factors: Denies Associated Symptoms: Dementia ROS: Patient currently denies any vision changes, tinnitus, difficulty speaking, facial droop, sore throat, chest pain, shortness of breath, abdominal pain, nausea/vomiting/diarrhea, dysuria, or weakness/numbness/paresthesias in any extremity   Physical Exam  Triage Vital Signs: ED Triage Vitals  Enc Vitals Group     BP 04/17/22 1120 (!) 173/56     Pulse Rate 04/17/22 1120 (!) 55     Resp 04/17/22 1120 16     Temp 04/17/22 1120 (!) 97.1 F (36.2 C)     Temp Source 04/17/22 1120 Oral     SpO2 04/17/22 1120 98 %     Weight 04/17/22 1121 112 lb (50.8 kg)     Height 04/17/22 1121 5\' 2"  (1.575 m)     Head Circumference --      Peak Flow --      Pain Score 04/17/22 1121 0     Pain Loc --      Pain Edu? --      Excl. in [place]? --    Most recent vital signs: Vitals:   04/17/22 1230 04/17/22 1500  BP: (!) 151/51 (!) 150/76  Pulse: (!) 52 (!) 55  Resp: 11 13  Temp:    SpO2: 96% 96%   General: Awake, oriented to self and situation CV:  Good peripheral perfusion.  Resp:  Normal effort.  Abd:  No distention.  Other:  Elderly Caucasian female laying in bed in no acute distress ED Results / Procedures / Treatments  Labs (all labs ordered are listed, but only abnormal results are displayed) Labs Reviewed  COMPREHENSIVE METABOLIC PANEL - Abnormal; Notable for the following components:      Result Value   Potassium 3.4 (*)    Glucose, Bld 64 (*)     BUN 45 (*)    Creatinine, Ser 1.53 (*)    Albumin 3.3 (*)    GFR, Estimated 32 (*)    All other components within normal limits  CBC WITH DIFFERENTIAL/PLATELET  LACTIC ACID, PLASMA  LIPASE, BLOOD   EKG ED ECG REPORT I, Naaman Plummer, the attending physician, personally viewed and interpreted this ECG. Date: 04/20/2022 EKG Time: 1124 Rate: 52 Rhythm: normal sinus rhythm QRS Axis: normal Intervals: normal ST/T Wave abnormalities: normal Narrative Interpretation: no evidence of acute ischemia PROCEDURES: Critical Care performed: No Procedures MEDICATIONS ORDERED IN ED: Medications - No data to display IMPRESSION / MDM / [place] / ED COURSE  I reviewed the triage vital signs and the nursing notes.                             The patient is on the cardiac monitor to evaluate for evidence of arrhythmia and/or significant heart rate changes. Patient's presentation is most consistent with acute presentation with potential threat to life or bodily function. The patient suffered an episode of altered mental status, but there is no overt concern for a dangerous emergent cause such  as, but not limited to, CNS infection, severe Toxidrome, severe metabolic derangement, or stroke.  Given History, Physical, and Workup the cause appears to be medication related  Disposition: Discharge. At the time of discharge, the patient is back to baseline mental status.   FINAL CLINICAL IMPRESSION(S) / ED DIAGNOSES   Final diagnoses:  Somnolence   Rx / DC Orders   ED Discharge Orders     None      Note:  This document was prepared using Dragon voice recognition software and may include unintentional dictation errors.   Naaman Plummer, MD 04/20/22 2325

## 2022-04-22 ENCOUNTER — Encounter: Payer: Self-pay | Admitting: Nurse Practitioner

## 2022-04-24 LAB — QUANTIFERON-TB GOLD PLUS (RQFGPL)
QuantiFERON Mitogen Value: >10 IU/mL
QuantiFERON Nil Value: 0.05 IU/mL
QuantiFERON TB1 Ag Value: 0.03 IU/mL
QuantiFERON TB2 Ag Value: 0.02 IU/mL

## 2022-04-24 LAB — QUANTIFERON-TB GOLD PLUS: QuantiFERON-TB Gold Plus: NEGATIVE

## 2022-04-30 ENCOUNTER — Other Ambulatory Visit: Payer: Medicare Other | Admitting: Student

## 2022-04-30 DIAGNOSIS — R630 Anorexia: Secondary | ICD-10-CM | POA: Diagnosis not present

## 2022-04-30 DIAGNOSIS — Z515 Encounter for palliative care: Secondary | ICD-10-CM | POA: Diagnosis not present

## 2022-04-30 DIAGNOSIS — F0280 Dementia in other diseases classified elsewhere without behavioral disturbance: Secondary | ICD-10-CM | POA: Diagnosis not present

## 2022-04-30 DIAGNOSIS — R531 Weakness: Secondary | ICD-10-CM | POA: Diagnosis not present

## 2022-04-30 DIAGNOSIS — G301 Alzheimer's disease with late onset: Secondary | ICD-10-CM | POA: Diagnosis not present

## 2022-04-30 DIAGNOSIS — E46 Unspecified protein-calorie malnutrition: Secondary | ICD-10-CM

## 2022-04-30 NOTE — Progress Notes (Signed)
  [facility] Palliative [facility] Consult Note Telephone: [phone]  Fax: [phone]    Date of encounter: 04/30/22 8:42 PM PATIENT NAME: Samantha Franco [address] [place] [address]   [phone] (home) [phone] (work) DOB: 01/14/1929 MRN: [id number] PRIMARY CARE PROVIDER:    Khan, Fozia M, MD,  [address] [place] [address] [phone]  REFERRING PROVIDER:   Abernathy, Alyssa, NP [address] [place],  [place] [address] [phone]  RESPONSIBLE PARTY:    Contact Information     Name Relation Home Work Mobile   Steele,Betsy Anne Daughter   [phone]   Burstein,Tim & Marilyn Son [phone]  [phone]   Matte,Lauren Granddaughter   [phone]   Nichol,Ian Grandson   [phone]        I met face to face with patient and family in the home. Palliative Care was asked to follow this patient by consultation request of  Abernathy, Alyssa, NP to address advance care planning and complex medical decision making. This is a follow up visit.                                   ASSESSMENT AND PLAN / RECOMMENDATIONS:   Advance Care Planning/Goals of Care: Goals include to maximize quality of life and symptom management. Patient/health care surrogate gave his/her permission to discuss. Our advance care planning conversation included a discussion about:    The value and importance of advance care planning  Experiences with loved ones who have been seriously ill or have died  Exploration of personal, cultural or spiritual beliefs that might influence medical decisions  Exploration of goals of care in the event of a sudden injury or illness  CODE STATUS: DNR  Education provided on palliative medicine versus hospice services. Daughter expresses patient is doing a little better since last visit.  We discussed her appearing to be eligible for hospice services. Palliative NP will follow-up with patient in 2 weeks.  Patient  is now a DNR. Will continue to provide supportive care, symptom management as needed.   Symptom Management/Plan:  Late onset Alzheimer's Dementia- reorient and redirect as needed. Caregivers to assist with adl's. Monitor for falls/safety. Use wheel chair as needed for weakness. Continue Namenda, quetiapine as directed. patient continues to have poor appetite, sleeping around 22 hours a day.   Generalized weakness-secondary to her dementia. Patient with increased weakness. Currently using walker for ambulation. Recommend w/c for locomotion when having difficulty ambulating.   Appetite, protein calorie malnutrition-patient with poor appetite. Recommend foods patient enjoys, offer snacks and fluids throughout the day. Albumin 3.3. left mid arm circumference 27.5 cm.  Follow up Palliative Care Visit: Palliative care will continue to follow for complex medical decision making, advance care planning, and clarification of goals. Return in 2 weeks or prn.  This visit was coded based on medical decision making (MDM).  PPS: 40%, weak  HOSPICE ELIGIBILITY/DIAGNOSIS: TBD  Chief Complaint: Palliative Medicine follow up visit.   HISTORY OF PRESENT ILLNESS:  Samantha Franco is a 88 y.o. year old female  with  late onset alzheimer's dementia, anxiety, T2DM, hypothyroidism, cervical spondylosis, essential hypertension, hx of lung cancer.  Patient with caregiver present.  Daughter via telephone.  Daughter states patient has been doing a little better since palliative last visited.  She states patient did go to son's house on Father's Day and it wiped her out. Patient continues to eat   bites of most meals. Caregiver Bethena Roys states patient did eat half a bowl of cereal this morning. Patient is sleeping around 22 hours a day.  She is more easily arousable and alert since her insulin dosage was cut back. Now receiving Levemir 5 units once a day.  Blood sugars range from 80s to 130s. Patient denies having pain. Daughter  reports patient did complain of some ankle/foot pain recently with mild swelling.  Patient is received resting in recliner.  She easily arouses to her name being called.  Patient has pleasant affect.  She answers direct questions.  She denies pain.  She is able to perform active range of motion to bilateral feet and ankles. She does have an old yellow bruise on the dorsal surface of her right foot.  No tenderness upon palpation. Daughter and caregiver contribute to HPI and ROS due to patient's dementia.    History obtained from review of EMR, discussion with primary team, and interview with family, facility staff/caregiver and/or Samantha Franco.  I reviewed available labs, medications, imaging, studies and related documents from the EMR.  Records reviewed and summarized above.    Physical Exam: Pulse 76, resp 16, b/p 118/64, sats 96-97% on room air Constitutional: NAD General: frail appearing, thin EYES: anicteric sclera, lids intact, no discharge  ENMT: intact hearing,  dentition intact CV: S1S2, RRR, no LE edema Pulmonary: LCTA, no increased work of breathing, no cough, room air Abdomen:  normo-active BS + 4 quadrants, soft and non tender, GU: deferred MSK: + sarcopenia, moves all extremities, ambulatory Skin: warm and dry, no rashes on visible skin Neuro: + generalized weakness, A & O x 2 Psych: non-anxious affect, pleasant Hem/lymph/immuno: no widespread bruising   Thank you for the opportunity to participate in the care of Samantha Franco.  The palliative care team will continue to follow. Please call our office at [phone] if we can be of additional assistance.   Ezekiel Slocumb, NP   COVID-19 PATIENT SCREENING TOOL Asked and negative response unless otherwise noted:   Have you had symptoms of covid, tested positive or been in contact with someone with symptoms/positive test in the past 5-10 days? No

## 2022-05-01 ENCOUNTER — Encounter: Payer: Self-pay | Admitting: Nurse Practitioner

## 2022-05-01 ENCOUNTER — Encounter: Payer: Self-pay | Admitting: Internal Medicine

## 2022-05-02 ENCOUNTER — Telehealth: Payer: Self-pay

## 2022-05-02 NOTE — Telephone Encounter (Signed)
Faxed FL 2 form to [facility] [phone] and fax [phone] and also pt daughter advised that we faxed FL 2 form

## 2022-05-06 ENCOUNTER — Encounter: Payer: Self-pay | Admitting: Nurse Practitioner

## 2022-05-13 ENCOUNTER — Other Ambulatory Visit: Payer: Medicare Other | Admitting: Student

## 2022-05-13 DIAGNOSIS — G301 Alzheimer's disease with late onset: Secondary | ICD-10-CM | POA: Diagnosis not present

## 2022-05-13 DIAGNOSIS — Z515 Encounter for palliative care: Secondary | ICD-10-CM

## 2022-05-13 DIAGNOSIS — E46 Unspecified protein-calorie malnutrition: Secondary | ICD-10-CM | POA: Diagnosis not present

## 2022-05-13 DIAGNOSIS — F0280 Dementia in other diseases classified elsewhere without behavioral disturbance: Secondary | ICD-10-CM

## 2022-05-13 DIAGNOSIS — R531 Weakness: Secondary | ICD-10-CM | POA: Diagnosis not present

## 2022-05-13 NOTE — Progress Notes (Unsigned)
[facility] Consult Note Telephone: [phone]  Fax: [phone]    Date of encounter: 05/13/22 2:25 PM PATIENT NAME: Samantha Franco [address] [place] [address]   [phone] (home) [phone] (work) DOB: 1929-04-16 MRN: [id number] PRIMARY CARE PROVIDER:    Lavera Guise, MD,  [address] [place] [address] [phone]  REFERRING PROVIDER:   Lavera Guise, MD [address],  [place] [address] [phone]  RESPONSIBLE PARTY:    Contact Information     Name Relation Home Work Mobile   [place] Daughter   [phone]   Dudek,Tim & Vernell Leep [phone]  [phone]   [place]   [phone]   Disney, Ruggiero   [phone]        I met face to face with patient and family in *** home/facility. Palliative Care was asked to follow this patient by consultation request of  Lavera Guise, MD to address advance care planning and complex medical decision making. This is a follow up visit.                                   ASSESSMENT AND PLAN / RECOMMENDATIONS:   Advance Care Planning/Goals of Care: Goals include to maximize quality of life and symptom management. Patient/health care surrogate gave his/her permission to discuss. Our advance care planning conversation included a discussion about:    The value and importance of advance care planning  Experiences with loved ones who have been seriously ill or have died  Exploration of personal, cultural or spiritual beliefs that might influence medical decisions  Exploration of goals of care in the event of a sudden injury or illness  Identification of a healthcare agent  Review and updating or creation of an  advance directive document . Decision not to resuscitate or to de-escalate disease focused treatments due to poor prognosis. CODE STATUS:  Symptom Management/Plan:    Follow up Palliative Care  Visit: Palliative care will continue to follow for complex medical decision making, advance care planning, and clarification of goals. Return *** weeks or prn.  I spent *** minutes providing this consultation. More than 50% of the time in this consultation was spent in counseling and care coordination.  This visit was coded based on medical decision making (MDM).***  PPS: 40%  HOSPICE ELIGIBILITY/DIAGNOSIS: TBD  Chief Complaint: Palliative medicine follow up visit.   HISTORY OF PRESENT ILLNESS:  Samantha Franco is a 88 y.o. year old female  with late onset alzheimer's dementia, anxiety, T2DM, hypothyroidism, cervical spondylosis, essential hypertension, hx of lung cancer. .   Mid arm circumference 27.5, no change.   History obtained from review of EMR, discussion with primary team, and interview with family, facility staff/caregiver and/or [name].  I reviewed available labs, medications, imaging, studies and related documents from the EMR.  Records reviewed and summarized above.   ROS  *** General: NAD EYES: denies vision changes ENMT: denies dysphagia Cardiovascular: denies chest pain, denies DOE Pulmonary: denies cough, denies increased SOB Abdomen: endorses good appetite, denies constipation, endorses continence of bowel GU: denies dysuria, endorses continence of urine MSK:  denies increased weakness,  no falls reported Skin: denies rashes or wounds Neurological: denies pain, denies insomnia Psych: Endorses positive mood Heme/lymph/immuno: denies bruises, abnormal bleeding  Physical Exam: Current and past weights: Pulse 60, resp 16, bp 108/58, sats 96% on room  air Constitutional: NAD General: frail appearing, thin EYES: anicteric sclera, lids intact, no discharge  ENMT: intact hearing, oral mucous membranes moist, dentition intact CV: S1S2, RRR, no LE edema Pulmonary: LCTA, no increased work of breathing, no cough, room air Abdomen: normo-active BS + 4 quadrants,  soft and non tender, no ascites GU: deferred MSK: no sarcopenia, moves all extremities, ambulatory Skin: warm and dry, no rashes or wounds on visible skin Neuro:  no generalized weakness,  no cognitive impairment Psych: non-anxious affect, A and O x 3 Hem/lymph/immuno: no widespread bruising   Thank you for the opportunity to participate in the care of [name].  The palliative care team will continue to follow. Please call our office at [phone] if we can be of additional assistance.   Ezekiel Slocumb, NP   COVID-19 PATIENT SCREENING TOOL Asked and negative response unless otherwise noted:   Have you had symptoms of covid, tested positive or been in contact with someone with symptoms/positive test in the past 5-10 days?

## 2022-05-17 ENCOUNTER — Telehealth: Payer: Self-pay

## 2022-05-17 NOTE — Telephone Encounter (Signed)
Faxed D/C order for gabapentin 100 mg TID and send new pres for gabapentin 100 mg 1 tab po bedtime PRN

## 2022-05-25 ENCOUNTER — Encounter: Payer: Self-pay | Admitting: Nurse Practitioner

## 2022-06-04 ENCOUNTER — Telehealth: Payer: Self-pay

## 2022-06-04 NOTE — Telephone Encounter (Signed)
Faxed FL2 form to [facility] [phone] with date change and alyssa signature

## 2022-06-11 DIAGNOSIS — E873 Alkalosis: Secondary | ICD-10-CM | POA: Diagnosis present

## 2022-06-11 DIAGNOSIS — K5904 Chronic idiopathic constipation: Secondary | ICD-10-CM | POA: Diagnosis not present

## 2022-06-11 DIAGNOSIS — F02818 Dementia in other diseases classified elsewhere, unspecified severity, with other behavioral disturbance: Secondary | ICD-10-CM | POA: Diagnosis present

## 2022-06-11 DIAGNOSIS — Z66 Do not resuscitate: Secondary | ICD-10-CM | POA: Diagnosis present

## 2022-06-11 DIAGNOSIS — E860 Dehydration: Secondary | ICD-10-CM | POA: Diagnosis present

## 2022-06-11 DIAGNOSIS — E119 Type 2 diabetes mellitus without complications: Secondary | ICD-10-CM | POA: Diagnosis not present

## 2022-06-11 DIAGNOSIS — R4182 Altered mental status, unspecified: Secondary | ICD-10-CM | POA: Diagnosis not present

## 2022-06-11 DIAGNOSIS — Z85118 Personal history of other malignant neoplasm of bronchus and lung: Secondary | ICD-10-CM | POA: Diagnosis not present

## 2022-06-11 DIAGNOSIS — E039 Hypothyroidism, unspecified: Secondary | ICD-10-CM | POA: Diagnosis not present

## 2022-06-11 DIAGNOSIS — Z8673 Personal history of transient ischemic attack (TIA), and cerebral infarction without residual deficits: Secondary | ICD-10-CM | POA: Diagnosis not present

## 2022-06-11 DIAGNOSIS — M6281 Muscle weakness (generalized): Secondary | ICD-10-CM | POA: Diagnosis not present

## 2022-06-11 DIAGNOSIS — G301 Alzheimer's disease with late onset: Secondary | ICD-10-CM | POA: Diagnosis present

## 2022-06-11 DIAGNOSIS — N179 Acute kidney failure, unspecified: Secondary | ICD-10-CM | POA: Diagnosis present

## 2022-06-11 DIAGNOSIS — Z87891 Personal history of nicotine dependence: Secondary | ICD-10-CM | POA: Diagnosis not present

## 2022-06-11 DIAGNOSIS — Z79899 Other long term (current) drug therapy: Secondary | ICD-10-CM | POA: Diagnosis not present

## 2022-06-11 DIAGNOSIS — E785 Hyperlipidemia, unspecified: Secondary | ICD-10-CM | POA: Diagnosis present

## 2022-06-11 DIAGNOSIS — I10 Essential (primary) hypertension: Secondary | ICD-10-CM | POA: Diagnosis not present

## 2022-06-11 DIAGNOSIS — G309 Alzheimer's disease, unspecified: Secondary | ICD-10-CM | POA: Diagnosis not present

## 2022-06-11 DIAGNOSIS — E1122 Type 2 diabetes mellitus with diabetic chronic kidney disease: Secondary | ICD-10-CM | POA: Diagnosis present

## 2022-06-11 DIAGNOSIS — G9340 Encephalopathy, unspecified: Secondary | ICD-10-CM | POA: Diagnosis present

## 2022-06-11 DIAGNOSIS — N189 Chronic kidney disease, unspecified: Secondary | ICD-10-CM | POA: Diagnosis present

## 2022-06-11 DIAGNOSIS — I959 Hypotension, unspecified: Secondary | ICD-10-CM | POA: Diagnosis not present

## 2022-06-11 DIAGNOSIS — R4020 Unspecified coma: Secondary | ICD-10-CM | POA: Diagnosis not present

## 2022-06-11 DIAGNOSIS — R404 Transient alteration of awareness: Secondary | ICD-10-CM | POA: Diagnosis not present

## 2022-06-11 DIAGNOSIS — F419 Anxiety disorder, unspecified: Secondary | ICD-10-CM | POA: Diagnosis present

## 2022-06-11 DIAGNOSIS — R001 Bradycardia, unspecified: Secondary | ICD-10-CM | POA: Diagnosis present

## 2022-06-11 DIAGNOSIS — E118 Type 2 diabetes mellitus with unspecified complications: Secondary | ICD-10-CM | POA: Diagnosis not present

## 2022-06-11 DIAGNOSIS — R55 Syncope and collapse: Secondary | ICD-10-CM | POA: Diagnosis not present

## 2022-06-11 DIAGNOSIS — R0902 Hypoxemia: Secondary | ICD-10-CM | POA: Diagnosis not present

## 2022-06-11 DIAGNOSIS — Z794 Long term (current) use of insulin: Secondary | ICD-10-CM | POA: Diagnosis not present

## 2022-06-11 DIAGNOSIS — R0689 Other abnormalities of breathing: Secondary | ICD-10-CM | POA: Diagnosis not present

## 2022-06-11 DIAGNOSIS — I129 Hypertensive chronic kidney disease with stage 1 through stage 4 chronic kidney disease, or unspecified chronic kidney disease: Secondary | ICD-10-CM | POA: Diagnosis present

## 2022-06-12 DIAGNOSIS — N189 Chronic kidney disease, unspecified: Secondary | ICD-10-CM | POA: Diagnosis present

## 2022-06-12 DIAGNOSIS — G9340 Encephalopathy, unspecified: Secondary | ICD-10-CM | POA: Diagnosis present

## 2022-06-12 DIAGNOSIS — F419 Anxiety disorder, unspecified: Secondary | ICD-10-CM | POA: Diagnosis present

## 2022-06-12 DIAGNOSIS — N179 Acute kidney failure, unspecified: Secondary | ICD-10-CM | POA: Diagnosis present

## 2022-06-12 DIAGNOSIS — Z66 Do not resuscitate: Secondary | ICD-10-CM | POA: Diagnosis present

## 2022-06-12 DIAGNOSIS — Z87891 Personal history of nicotine dependence: Secondary | ICD-10-CM | POA: Diagnosis not present

## 2022-06-12 DIAGNOSIS — E785 Hyperlipidemia, unspecified: Secondary | ICD-10-CM | POA: Diagnosis present

## 2022-06-12 DIAGNOSIS — F02818 Dementia in other diseases classified elsewhere, unspecified severity, with other behavioral disturbance: Secondary | ICD-10-CM | POA: Diagnosis present

## 2022-06-12 DIAGNOSIS — Z79899 Other long term (current) drug therapy: Secondary | ICD-10-CM | POA: Diagnosis not present

## 2022-06-12 DIAGNOSIS — Z8673 Personal history of transient ischemic attack (TIA), and cerebral infarction without residual deficits: Secondary | ICD-10-CM | POA: Diagnosis not present

## 2022-06-12 DIAGNOSIS — G301 Alzheimer's disease with late onset: Secondary | ICD-10-CM | POA: Diagnosis present

## 2022-06-12 DIAGNOSIS — G309 Alzheimer's disease, unspecified: Secondary | ICD-10-CM | POA: Diagnosis not present

## 2022-06-12 DIAGNOSIS — E860 Dehydration: Secondary | ICD-10-CM | POA: Diagnosis present

## 2022-06-12 DIAGNOSIS — Z794 Long term (current) use of insulin: Secondary | ICD-10-CM | POA: Diagnosis not present

## 2022-06-12 DIAGNOSIS — R55 Syncope and collapse: Secondary | ICD-10-CM | POA: Diagnosis present

## 2022-06-12 DIAGNOSIS — E1122 Type 2 diabetes mellitus with diabetic chronic kidney disease: Secondary | ICD-10-CM | POA: Diagnosis present

## 2022-06-12 DIAGNOSIS — E119 Type 2 diabetes mellitus without complications: Secondary | ICD-10-CM | POA: Diagnosis not present

## 2022-06-12 DIAGNOSIS — I129 Hypertensive chronic kidney disease with stage 1 through stage 4 chronic kidney disease, or unspecified chronic kidney disease: Secondary | ICD-10-CM | POA: Diagnosis present

## 2022-06-12 DIAGNOSIS — E873 Alkalosis: Secondary | ICD-10-CM | POA: Diagnosis present

## 2022-06-12 DIAGNOSIS — R001 Bradycardia, unspecified: Secondary | ICD-10-CM | POA: Diagnosis present

## 2022-06-12 DIAGNOSIS — Z85118 Personal history of other malignant neoplasm of bronchus and lung: Secondary | ICD-10-CM | POA: Diagnosis not present

## 2022-06-14 ENCOUNTER — Telehealth: Payer: Self-pay

## 2022-06-14 NOTE — Telephone Encounter (Signed)
Called Betsy to schedule hospital follow up, she stated it would not be necessary-Toni

## 2022-06-18 DIAGNOSIS — E039 Hypothyroidism, unspecified: Secondary | ICD-10-CM | POA: Diagnosis not present

## 2022-06-18 DIAGNOSIS — K5904 Chronic idiopathic constipation: Secondary | ICD-10-CM | POA: Diagnosis not present

## 2022-06-18 DIAGNOSIS — I10 Essential (primary) hypertension: Secondary | ICD-10-CM | POA: Diagnosis not present

## 2022-06-18 DIAGNOSIS — R55 Syncope and collapse: Secondary | ICD-10-CM | POA: Diagnosis not present

## 2022-06-18 DIAGNOSIS — M6281 Muscle weakness (generalized): Secondary | ICD-10-CM | POA: Diagnosis not present

## 2022-06-18 DIAGNOSIS — G301 Alzheimer's disease with late onset: Secondary | ICD-10-CM | POA: Diagnosis not present

## 2022-06-26 DIAGNOSIS — F03918 Unspecified dementia, unspecified severity, with other behavioral disturbance: Secondary | ICD-10-CM | POA: Diagnosis not present

## 2022-06-26 DIAGNOSIS — F419 Anxiety disorder, unspecified: Secondary | ICD-10-CM | POA: Diagnosis not present

## 2022-07-02 DIAGNOSIS — G301 Alzheimer's disease with late onset: Secondary | ICD-10-CM | POA: Diagnosis not present

## 2022-07-02 DIAGNOSIS — G4710 Hypersomnia, unspecified: Secondary | ICD-10-CM | POA: Diagnosis not present

## 2022-07-02 DIAGNOSIS — I10 Essential (primary) hypertension: Secondary | ICD-10-CM | POA: Diagnosis not present

## 2022-07-09 DIAGNOSIS — G301 Alzheimer's disease with late onset: Secondary | ICD-10-CM | POA: Diagnosis not present

## 2022-07-09 DIAGNOSIS — I10 Essential (primary) hypertension: Secondary | ICD-10-CM | POA: Diagnosis not present

## 2022-07-09 DIAGNOSIS — G4710 Hypersomnia, unspecified: Secondary | ICD-10-CM | POA: Diagnosis not present

## 2022-07-18 DIAGNOSIS — N390 Urinary tract infection, site not specified: Secondary | ICD-10-CM | POA: Diagnosis not present

## 2022-07-18 DIAGNOSIS — G301 Alzheimer's disease with late onset: Secondary | ICD-10-CM | POA: Diagnosis not present

## 2022-07-22 DIAGNOSIS — E039 Hypothyroidism, unspecified: Secondary | ICD-10-CM | POA: Diagnosis not present

## 2022-07-22 DIAGNOSIS — N179 Acute kidney failure, unspecified: Secondary | ICD-10-CM | POA: Diagnosis not present

## 2022-07-22 DIAGNOSIS — G9340 Encephalopathy, unspecified: Secondary | ICD-10-CM | POA: Diagnosis not present

## 2022-07-22 DIAGNOSIS — A419 Sepsis, unspecified organism: Secondary | ICD-10-CM | POA: Diagnosis not present

## 2022-07-22 DIAGNOSIS — N189 Chronic kidney disease, unspecified: Secondary | ICD-10-CM | POA: Diagnosis not present

## 2022-07-22 DIAGNOSIS — E119 Type 2 diabetes mellitus without complications: Secondary | ICD-10-CM | POA: Diagnosis not present

## 2022-07-22 DIAGNOSIS — R6520 Severe sepsis without septic shock: Secondary | ICD-10-CM | POA: Diagnosis not present

## 2022-07-22 DIAGNOSIS — G309 Alzheimer's disease, unspecified: Secondary | ICD-10-CM | POA: Diagnosis not present

## 2022-07-22 DIAGNOSIS — N3000 Acute cystitis without hematuria: Secondary | ICD-10-CM | POA: Diagnosis not present

## 2022-07-22 DIAGNOSIS — F02818 Dementia in other diseases classified elsewhere, unspecified severity, with other behavioral disturbance: Secondary | ICD-10-CM | POA: Diagnosis not present

## 2022-07-22 DIAGNOSIS — G9341 Metabolic encephalopathy: Secondary | ICD-10-CM | POA: Diagnosis not present

## 2022-08-02 ENCOUNTER — Telehealth: Payer: Self-pay

## 2022-08-02 NOTE — Telephone Encounter (Signed)
1003- Palliative Check In  Spoke with dtr- Franne Grip regarding patient. Dtr reports that they moved patient to a facility last month in [place]. Pt discharged from palliative care.   Will notify LaToya,NP

## 2022-09-18 ENCOUNTER — Ambulatory Visit: Payer: Medicare Other | Admitting: Nurse Practitioner

## 2022-10-01 ENCOUNTER — Ambulatory Visit: Payer: Medicare Other | Admitting: Nurse Practitioner
